# Patient Record
Sex: Female | Born: 1945 | Race: White | Hispanic: No | Marital: Single | Smoking: Former smoker
Health system: Southern US, Community
[De-identification: ages and names within clinical notes are randomized; demographics above are authoritative.]

## PROBLEM LIST (undated history)

## (undated) DIAGNOSIS — T8859XA Other complications of anesthesia, initial encounter: Secondary | ICD-10-CM

## (undated) DIAGNOSIS — T7840XA Allergy, unspecified, initial encounter: Secondary | ICD-10-CM

## (undated) DIAGNOSIS — I10 Essential (primary) hypertension: Secondary | ICD-10-CM

## (undated) DIAGNOSIS — J189 Pneumonia, unspecified organism: Secondary | ICD-10-CM

## (undated) DIAGNOSIS — R112 Nausea with vomiting, unspecified: Secondary | ICD-10-CM

## (undated) DIAGNOSIS — D649 Anemia, unspecified: Secondary | ICD-10-CM

## (undated) DIAGNOSIS — F32A Depression, unspecified: Secondary | ICD-10-CM

## (undated) DIAGNOSIS — C801 Malignant (primary) neoplasm, unspecified: Secondary | ICD-10-CM

## (undated) DIAGNOSIS — Z9889 Other specified postprocedural states: Secondary | ICD-10-CM

## (undated) DIAGNOSIS — I82401 Acute embolism and thrombosis of unspecified deep veins of right lower extremity: Secondary | ICD-10-CM

## (undated) DIAGNOSIS — R7303 Prediabetes: Secondary | ICD-10-CM

## (undated) DIAGNOSIS — C3490 Malignant neoplasm of unspecified part of unspecified bronchus or lung: Secondary | ICD-10-CM

## (undated) DIAGNOSIS — Z8719 Personal history of other diseases of the digestive system: Secondary | ICD-10-CM

## (undated) HISTORY — PX: EYE SURGERY: SHX253

## (undated) HISTORY — PX: TONSILLECTOMY: SUR1361

## (undated) HISTORY — DX: Allergy, unspecified, initial encounter: T78.40XA

## (undated) HISTORY — PX: TONSILLECTOMY AND ADENOIDECTOMY: SUR1326

## (undated) HISTORY — DX: Acute embolism and thrombosis of unspecified deep veins of right lower extremity: I82.401

## (undated) HISTORY — PX: TUBAL LIGATION: SHX77

## (undated) HISTORY — DX: Essential (primary) hypertension: I10

---

## 1999-06-07 ENCOUNTER — Other Ambulatory Visit: Admission: RE | Admit: 1999-06-07 | Discharge: 1999-06-07 | Payer: Self-pay | Admitting: Family Medicine

## 1999-06-23 ENCOUNTER — Encounter: Admission: RE | Admit: 1999-06-23 | Discharge: 1999-06-23 | Payer: Self-pay | Admitting: Family Medicine

## 1999-06-23 ENCOUNTER — Encounter: Payer: Self-pay | Admitting: Family Medicine

## 2000-06-27 ENCOUNTER — Encounter: Admission: RE | Admit: 2000-06-27 | Discharge: 2000-06-27 | Payer: Self-pay | Admitting: Family Medicine

## 2000-06-27 ENCOUNTER — Encounter: Payer: Self-pay | Admitting: Family Medicine

## 2001-06-11 ENCOUNTER — Other Ambulatory Visit: Admission: RE | Admit: 2001-06-11 | Discharge: 2001-06-11 | Payer: Self-pay | Admitting: Family Medicine

## 2001-07-01 ENCOUNTER — Encounter: Payer: Self-pay | Admitting: Family Medicine

## 2001-07-01 ENCOUNTER — Encounter: Admission: RE | Admit: 2001-07-01 | Discharge: 2001-07-01 | Payer: Self-pay | Admitting: Family Medicine

## 2002-06-16 ENCOUNTER — Other Ambulatory Visit: Admission: RE | Admit: 2002-06-16 | Discharge: 2002-06-16 | Payer: Self-pay | Admitting: Family Medicine

## 2002-07-02 ENCOUNTER — Encounter: Admission: RE | Admit: 2002-07-02 | Discharge: 2002-07-02 | Payer: Self-pay | Admitting: Family Medicine

## 2002-07-02 ENCOUNTER — Encounter: Payer: Self-pay | Admitting: Family Medicine

## 2003-08-05 ENCOUNTER — Encounter: Admission: RE | Admit: 2003-08-05 | Discharge: 2003-08-05 | Payer: Self-pay | Admitting: Family Medicine

## 2003-08-18 ENCOUNTER — Other Ambulatory Visit: Admission: RE | Admit: 2003-08-18 | Discharge: 2003-08-18 | Payer: Self-pay | Admitting: Family Medicine

## 2004-04-13 ENCOUNTER — Inpatient Hospital Stay (HOSPITAL_COMMUNITY): Admission: EM | Admit: 2004-04-13 | Discharge: 2004-04-15 | Payer: Self-pay | Admitting: Emergency Medicine

## 2004-04-15 ENCOUNTER — Encounter: Payer: Self-pay | Admitting: *Deleted

## 2004-08-18 ENCOUNTER — Other Ambulatory Visit: Admission: RE | Admit: 2004-08-18 | Discharge: 2004-08-18 | Payer: Self-pay | Admitting: Family Medicine

## 2004-08-22 ENCOUNTER — Ambulatory Visit (HOSPITAL_COMMUNITY): Admission: RE | Admit: 2004-08-22 | Discharge: 2004-08-22 | Payer: Self-pay | Admitting: Family Medicine

## 2005-08-21 ENCOUNTER — Other Ambulatory Visit: Admission: RE | Admit: 2005-08-21 | Discharge: 2005-08-21 | Payer: Self-pay | Admitting: Family Medicine

## 2006-08-29 ENCOUNTER — Encounter: Admission: RE | Admit: 2006-08-29 | Discharge: 2006-08-29 | Payer: Self-pay | Admitting: Family Medicine

## 2006-08-29 ENCOUNTER — Other Ambulatory Visit: Admission: RE | Admit: 2006-08-29 | Discharge: 2006-08-29 | Payer: Self-pay | Admitting: Family Medicine

## 2007-09-09 ENCOUNTER — Other Ambulatory Visit: Admission: RE | Admit: 2007-09-09 | Discharge: 2007-09-09 | Payer: Self-pay | Admitting: Family Medicine

## 2007-09-09 ENCOUNTER — Encounter: Admission: RE | Admit: 2007-09-09 | Discharge: 2007-09-09 | Payer: Self-pay | Admitting: Family Medicine

## 2007-12-12 ENCOUNTER — Encounter: Admission: RE | Admit: 2007-12-12 | Discharge: 2007-12-12 | Payer: Self-pay | Admitting: Orthopaedic Surgery

## 2008-05-18 ENCOUNTER — Ambulatory Visit (HOSPITAL_BASED_OUTPATIENT_CLINIC_OR_DEPARTMENT_OTHER): Admission: RE | Admit: 2008-05-18 | Discharge: 2008-05-18 | Payer: Self-pay | Admitting: Family Medicine

## 2008-09-09 ENCOUNTER — Encounter: Admission: RE | Admit: 2008-09-09 | Discharge: 2008-09-09 | Payer: Self-pay | Admitting: Family Medicine

## 2008-09-09 ENCOUNTER — Other Ambulatory Visit: Admission: RE | Admit: 2008-09-09 | Discharge: 2008-09-09 | Payer: Self-pay | Admitting: Family Medicine

## 2009-09-10 ENCOUNTER — Encounter: Admission: RE | Admit: 2009-09-10 | Discharge: 2009-09-10 | Payer: Self-pay | Admitting: Family Medicine

## 2009-09-10 ENCOUNTER — Other Ambulatory Visit: Admission: RE | Admit: 2009-09-10 | Discharge: 2009-09-10 | Payer: Self-pay | Admitting: Family Medicine

## 2010-09-29 ENCOUNTER — Other Ambulatory Visit (HOSPITAL_COMMUNITY)
Admission: RE | Admit: 2010-09-29 | Discharge: 2010-09-29 | Disposition: A | Discharge status: Home / Self Care | Payer: 59 | Source: Ambulatory Visit | Attending: Family Medicine | Admitting: Family Medicine

## 2010-09-29 ENCOUNTER — Other Ambulatory Visit: Payer: Self-pay | Admitting: Family Medicine

## 2010-09-29 DIAGNOSIS — M8580 Other specified disorders of bone density and structure, unspecified site: Secondary | ICD-10-CM

## 2010-09-29 DIAGNOSIS — Z124 Encounter for screening for malignant neoplasm of cervix: Secondary | ICD-10-CM | POA: Insufficient documentation

## 2010-09-29 DIAGNOSIS — Z1159 Encounter for screening for other viral diseases: Secondary | ICD-10-CM | POA: Insufficient documentation

## 2010-11-18 NOTE — H&P (Signed)
Kimberly Beck, Kimberly Beck            ACCOUNT NO.:  [id number]   MEDICAL RECORD NO.:  [id number]          PATIENT TYPE:  EMS   LOCATION:  ED                           FACILITY:  [facility]   PHYSICIAN:  Melissa L. Ladona Ridgel, MD  DATE OF BIRTH:  [birth year]   DATE OF ADMISSION:  04/12/2004  DATE OF DISCHARGE:                                HISTORY & PHYSICAL   PRIMARY CARE PHYSICIAN:  Caryn Bee L. Little, M.D.   CHIEF COMPLAINT:  I can't remember.   HISTORY OF PRESENT ILLNESS:  The patient is a 65 year old white female who  presented to the [facility] in the [facility] for a facial and  other treatments today.  She had undergone a consultation yesterday to  establish which treatment she was going to undergo and returned today for  those treatments.  She had established a relationship with a consultant  there who was doing her treatments.  Today, when the patient had started her  facial, she had the application of 4% lidocaine to her face, and then the  consultant paused in their therapy because the patient was experiencing a  little bit of discomfort.  They moved on to another treatment, after which  the patient stated she could not recall why she was at the [facility].  The  consultant became concerned when she could not recall the events of the past  2 days, nor could she recall meeting the consultant.  The consultant  therefore brought her to the emergency room for further evaluation.  In the  emergency room, the patient was evaluated by neurology and found to have  recollection of past events, but not for the last couple of days.   REVIEW OF SYSTEMS:  In the emergency room, the patient denied having any  recent fever, chills, nausea, vomiting, or diarrhea.  She denies any recent  weight loss that she can recall, and she was unable to identify the last  events that she could recall over the past couple of days.   PAST MEDICAL HISTORY:  Significant for osteoporosis and  hypertension.   PAST SURGICAL HISTORY:  She has had none.   SOCIAL HISTORY:  She has no children.  She is gravida 1, para 1, with a  stillborn birth.  She was married, and has not been with her partner for  greater than 10 years.  She quit tobacco in 1987.  She denies ethanol or  illicit drug use.   FAMILY HISTORY:  Her mother and father are both deceased and had  hypertension.  Other than that, she cannot recall any other significant  medical history.   ALLERGIES:  She states she has no known drug allergies.   MEDICATIONS:  She states that she takes Fosamax and Accupril, but the doses  are unknown to her.   LABORATORY DATA:  White count of 9.7, hemoglobin 13.7, hematocrit 41.5,  platelets 234.  Sodium is 138, potassium 3.7, chloride 106, CO2 of 26, BUN  11, creatinine 0.9, with a glucose of 123.  Urine drug screen is negative.   PHYSICAL EXAMINATION:  VITAL SIGNS:  Temperature is  97.3, blood pressure  151/100, pulse 98, respiratory rate 20.  She is 97% on room air.  GENERAL:  This is a tearful white female in no acute distress, other than  the fact that she cannot recall why she is in the hospital or what has been  occurring over the last couple of days.  HEENT:  She is normocephalic and atraumatic.  Pupils equal, round and  reactive to light.  Extraocular muscles are intact.  Mucous membranes are  moist.  She does have orthodontics in her lower teeth.  NECK:  Supple.  There is no JVD, no lymph nodes.  CHEST:  Clear to auscultation.  There is no rales, rhonchi, or wheezes.  CARDIOVASCULAR:  Regular rate and rhythm.  Positive S1 and S2.  No S3 or S4.  No murmurs, rubs, or gallops.  ABDOMEN:  Soft, nontender, nondistended, with positive bowel sounds.  EXTREMITIES:  No clubbing, cyanosis, or edema.  NEUROLOGIC:  Cranial nerves II-XII appear to be intact.  She knows that it  is October; however, she thinks that it is 2006, and she is unsure of what  day of the week it is.  Her  concentration skills prevent further testing.  She perseverates regarding feeding her dog and giving [place] her friend's phone  number, even though she has already given it to several people.  Her  strength is 5/5.  Deep tendon reflexes are 2+; the left appears more  hyperactive than the right.  Otherwise, her exam is nonfocal.   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  The plan is to admit her for further evaluation.  1.  Neurological.  I have appreciated Dr. Marlis Edelson consult and      recommendations.  We will therefore obtain carotid ultrasounds, MRI, MRA      of her head, EEG, and urine drug screen is completed and was negative.  2.  Cardiovascular.  Will add lisinopril 10 mg until we get a medication      list from her primary care practitioner.  3.  Will obtain records from Dr. Fredirick Maudlin office in the a.m.  4.  Pulmonary.  There are no clinical issues at this time.  I do not feel      that an x-ray is necessary at this time.  5.  Endocrine.  I will check a TSH.  6.  Otherwise, the patient will be closely observed for resolution of her      symptoms.     Meli   MLT/MEDQ  D:  04/13/2004  T:  04/13/2004  Job:  [id number]   cc:   Caryn Bee L. Little, M.D.  [address]  [place]  [place] [address]  Fax: [phone]

## 2010-11-18 NOTE — Procedures (Signed)
CLINICAL HISTORY:  A 65 year old woman with presumed episode of transient  global amnesia. EEG is performed for evaluation. The patient is described as  awake. This is a routine EEG done without photic stimulation or  hyperventilation.   DESCRIPTION OF PROCEDURE:  The dominant rhythm is tracing. It is a  moderately high amplitude alpha rhythm of 9 to 10 hertz, which predominates  posteriorly. Appears without abnormal asymmetry and ___________ with eye  opening and closing. Low amplitude fast activity is seen frontally and  centrally and appears without abnormal asymmetry. No focal slowing is noted.  No epileptiform discharges are seen. The patient remains in the awake state  throughout the recording. Photic stimulation and hyperventilation were not  performed. Single channel devoted to EKG. Reveals sinus rhythm throughout  with a rate of approximately 84 beats per minute.   CONCLUSION:  Normal study in the awake state.      ZOX:WRUE  D:  04/13/2004 18:22:29  T:  04/14/2004 01:43:52  Job #:  [id number]

## 2010-11-18 NOTE — Discharge Summary (Signed)
Kimberly Beck, Kimberly Beck            ACCOUNT NO.:  [id number]   MEDICAL RECORD NO.:  [id number]          PATIENT TYPE:  INP   LOCATION:  0484                         FACILITY:  [facility]   PHYSICIAN:  Theone Stanley, MD   DATE OF BIRTH:  [birth year]   DATE OF ADMISSION:  04/12/2004  DATE OF DISCHARGE:  04/15/2004                                 DISCHARGE SUMMARY   ADMISSION DIAGNOSES:  1.  Transglobal amnesia.  2.  Hypertension.   DISCHARGE DIAGNOSES:  1.  Transglobal amnesia.  2.  Hypertension.   HOSPITAL COURSE:  [name] is a very pleasant 65 year old Caucasian  female who apparently on her day of admission had gone to a place for some  cosmetic work. She states that they started the procedure, she felt light  burns along her crease of her face and subsequently does not remember  anything after that. She has talked to the person who was doing the  procedure and the person stated that after starting the procedure, she was  doing fine and then did not remember anything.  At that point in time, EMS  was called and she was brought to [facility] ER.  The patient was acting  confused not remembering anything. She had difficulty remembering what was  going on that day and yesterday, however, she did remember the events of the  past.  She was able to give her phone number as well as the numbers of her  neighbors and other people. She denied any headaches, focal weakness,  balance or gait difficulties, dizziness. No prior history of seizure or  stroke, TIA's or similar episodes in the past. No significant head injury.  By the next day, the patient was alert and oriented x4. She was able to  communicate without difficulty, ambulate without difficulty; however, she  could not remember anything prior to start of the procedure. Neurology was  consulted and at that time they had suggested an MRI, EEG, Dopplers which  were performed. The Dopplers did not show any gross abnormalities. A CT  scan  was done on the 11th which was negative. A MRI was subsequently done on the  14th.   IMPRESSION:  Mild prominence of perivascular spaces may suggest long  standing hypertension.  Mild changes of small vessel disease, no acute  infarct, no abnormality, intracranial enhancement.  An EEG was also  performed which showed a normal study in the awake state.  Because  everything was negative, it was felt that the final diagnosis is transglobal  amnesia possibly brought on by this procedure and the pain involved with it.  Neurology's recommendations were for followup in two months with Dr. Pearlean Brownie.  No long distance driving until seen by neurology. The patient is to see her  primary care in 2-3 weeks.   DISCHARGE MEDICATIONS:  She was not placed on any new medications. She was  to continue her home medications which were:   1.  Accupril 40 mg, 1 p.o. q.d.  2.  Aspirin 81 mg, 1 p.o. q.d.  3.  Fosamax 70 mg, 1 p.o. q. weekly.  The patient left the hospital in stable condition.      AEJ/MEDQ  D:  04/15/2004  T:  04/16/2004  Job:  [id number]   cc:   Caryn Bee L. Little, M.D.  [address]  [place]  [place] [address]  Fax: [phone]

## 2010-11-18 NOTE — Consult Note (Signed)
Kimberly Beck, Kimberly Beck            ACCOUNT NO.:  [id number]   MEDICAL RECORD NO.:  [id number]          PATIENT TYPE:  INP   LOCATION:  0484                         FACILITY:  [facility]   PHYSICIAN:  Pramod P. Pearlean Brownie, MD    DATE OF BIRTH:  15-Feb-1946   DATE OF CONSULTATION:  04/12/2004  DATE OF DISCHARGE:                                   CONSULTATION   REFERRING PHYSICIAN:  Linwood Dibbles, M.D.   REASON FOR REFERRAL:  Memory loss.   HISTORY OF PRESENT ILLNESS:  Kimberly Beck is a 65 year old lady who has  developed sudden onset of short-term memory loss today.  Patient is unable  to provide specific details of her history as she has trouble remembering.  She is accompanied today by a lady whom she met today who has been helping  her.  Apparently, the patient was in the shopping center and all of a sudden  was acting confused and not remembering things and what she was doing there.  She also had difficulty remembering even from today as well as yesterday.  She, however, has been remembering events from the past.  She was able to  give her phone number as well as numbers for her neighbors and other people.  She denies any headache, focal extremity weakness, gait/balance  difficulties, dizziness.  There is no prior history of seizures, strokes,  TIAs, similar episodes in the past, or any significant neurologic problems.  There is no history of remote seizures, encephalitis, meningitis.  No  significant head injury.   PAST MEDICAL HISTORY:  1.  Hypertension.  2.  Osteoporosis.   HOME MEDICATIONS:  1.  Fosamax.  2.  Accupril.   ALLERGIES:  None.   PAST SURGICAL HISTORY:  None.   SOCIAL HISTORY:  Patient quit smoking 20 years ago.  She does not drink  alcohol.  She lives alone in [place].   REVIEW OF SYSTEMS:  Not significant for recent illness, fever, cough, chest  pain, diarrhea.   PHYSICAL EXAMINATION:  GENERAL:  Pleasant, middle aged lady not in distress.  VITAL SIGNS:   Afebrile.  Temperature 97.3, pulse 98 per minute, regular,  respiratory rate 20 per minute, blood pressure 151/100.  Saturation 97% on  room air.  Distal pulses well felt.  HEENT:  Head is normocephalic.  ENT:  Unremarkable.  NECK:  Supple without bruit.  CARDIAC:  No murmur or gallop.  LUNGS:  Clear to auscultation.  NEUROLOGIC:  Patient is awake, alert, oriented x3.  There is no aphasia,  apraxia, or dysarthria.  She had decreased recall 0/3 and attention and  calculation.  She can, however, follow three step commands.  She appears  extremely frustrated about her condition.  Face is symmetric.  Palate  elevates normally.  Tongue is midline.  Eye movements are full range without  nystagmus.  Visual acuity appears adequate.  Visual fields are full to  confrontation testing.  Motor system examination reveals symmetric strength  __________ coordination, sensation.  She walks with a slow, steady gait  including tandem walking.  Deep tendon reflexes are symmetric.  Plantars are  downgoing.  Sensation, coordination are intact.   DATA REVIEWED:  Noncontrast CAT scan of her head done today is unremarkable.   LABORATORIES:  WBC count, chemistries both normal.   IMPRESSION:  A 65 year old lady with sudden onset of short-term memory loss  without any accompanying neurologic symptoms or signs likely secondary to  transient global amnesia.   PLAN:  Patient is being admitted to hospitalists service for observation.  Recommend MRI scan of the brain with MRA of the brain and EEG.  I expect her  memory to start improving some.  I doubt that this is an infarction or  seizure.  Kindly call for questions.  Thank you for referring this patient.      PPS/MEDQ  D:  04/12/2004  T:  04/13/2004  Job:  [id number]

## 2011-11-01 ENCOUNTER — Other Ambulatory Visit: Payer: Self-pay | Admitting: Family Medicine

## 2011-11-01 DIAGNOSIS — M899 Disorder of bone, unspecified: Secondary | ICD-10-CM

## 2011-11-01 DIAGNOSIS — M949 Disorder of cartilage, unspecified: Secondary | ICD-10-CM

## 2011-11-13 ENCOUNTER — Inpatient Hospital Stay: Admission: RE | Admit: 2011-11-13 | Payer: 59 | Source: Ambulatory Visit

## 2012-09-04 ENCOUNTER — Other Ambulatory Visit: Payer: Self-pay

## 2012-09-04 DIAGNOSIS — Z1231 Encounter for screening mammogram for malignant neoplasm of breast: Secondary | ICD-10-CM

## 2012-10-03 ENCOUNTER — Ambulatory Visit
Admission: RE | Admit: 2012-10-03 | Discharge: 2012-10-03 | Disposition: A | Discharge status: Home / Self Care | Payer: BC Managed Care – PPO | Source: Ambulatory Visit

## 2012-10-03 DIAGNOSIS — Z1231 Encounter for screening mammogram for malignant neoplasm of breast: Secondary | ICD-10-CM

## 2013-05-26 ENCOUNTER — Other Ambulatory Visit: Payer: Self-pay

## 2013-05-26 DIAGNOSIS — Z1231 Encounter for screening mammogram for malignant neoplasm of breast: Secondary | ICD-10-CM

## 2013-10-06 ENCOUNTER — Ambulatory Visit
Admission: RE | Admit: 2013-10-06 | Discharge: 2013-10-06 | Disposition: A | Discharge status: Home / Self Care | Payer: BC Managed Care – PPO | Source: Ambulatory Visit

## 2013-10-06 DIAGNOSIS — Z1231 Encounter for screening mammogram for malignant neoplasm of breast: Secondary | ICD-10-CM

## 2013-10-08 ENCOUNTER — Other Ambulatory Visit: Payer: Self-pay | Admitting: Family Medicine

## 2013-10-08 DIAGNOSIS — R928 Other abnormal and inconclusive findings on diagnostic imaging of breast: Secondary | ICD-10-CM

## 2013-10-20 ENCOUNTER — Ambulatory Visit
Admission: RE | Admit: 2013-10-20 | Discharge: 2013-10-20 | Disposition: A | Discharge status: Home / Self Care | Payer: BC Managed Care – PPO | Source: Ambulatory Visit | Attending: Family Medicine | Admitting: Family Medicine

## 2013-10-20 DIAGNOSIS — R928 Other abnormal and inconclusive findings on diagnostic imaging of breast: Secondary | ICD-10-CM

## 2014-06-23 ENCOUNTER — Other Ambulatory Visit: Payer: Self-pay

## 2014-06-23 DIAGNOSIS — Z1231 Encounter for screening mammogram for malignant neoplasm of breast: Secondary | ICD-10-CM

## 2014-11-04 ENCOUNTER — Ambulatory Visit
Admission: RE | Admit: 2014-11-04 | Discharge: 2014-11-04 | Disposition: A | Discharge status: Home / Self Care | Payer: BLUE CROSS/BLUE SHIELD | Source: Ambulatory Visit

## 2014-11-04 DIAGNOSIS — Z1231 Encounter for screening mammogram for malignant neoplasm of breast: Secondary | ICD-10-CM

## 2014-11-26 ENCOUNTER — Ambulatory Visit
Admission: RE | Admit: 2014-11-26 | Discharge: 2014-11-26 | Disposition: A | Discharge status: Home / Self Care | Payer: BLUE CROSS/BLUE SHIELD | Source: Ambulatory Visit | Attending: Family Medicine | Admitting: Family Medicine

## 2014-11-26 ENCOUNTER — Other Ambulatory Visit: Payer: Self-pay | Admitting: Family Medicine

## 2014-11-26 DIAGNOSIS — W19XXXA Unspecified fall, initial encounter: Secondary | ICD-10-CM

## 2014-11-26 DIAGNOSIS — R0781 Pleurodynia: Secondary | ICD-10-CM

## 2015-12-24 ENCOUNTER — Other Ambulatory Visit: Payer: Self-pay | Admitting: Family Medicine

## 2015-12-24 DIAGNOSIS — Z1231 Encounter for screening mammogram for malignant neoplasm of breast: Secondary | ICD-10-CM

## 2015-12-30 ENCOUNTER — Ambulatory Visit
Admission: RE | Admit: 2015-12-30 | Discharge: 2015-12-30 | Disposition: A | Discharge status: Home / Self Care | Payer: BLUE CROSS/BLUE SHIELD | Source: Ambulatory Visit | Attending: Family Medicine | Admitting: Family Medicine

## 2015-12-30 DIAGNOSIS — Z1231 Encounter for screening mammogram for malignant neoplasm of breast: Secondary | ICD-10-CM

## 2016-12-27 ENCOUNTER — Other Ambulatory Visit: Payer: Self-pay | Admitting: Family Medicine

## 2016-12-27 DIAGNOSIS — Z1231 Encounter for screening mammogram for malignant neoplasm of breast: Secondary | ICD-10-CM

## 2017-01-02 ENCOUNTER — Ambulatory Visit
Admission: RE | Admit: 2017-01-02 | Discharge: 2017-01-02 | Disposition: A | Discharge status: Home / Self Care | Payer: BLUE CROSS/BLUE SHIELD | Source: Ambulatory Visit | Attending: Family Medicine | Admitting: Family Medicine

## 2017-01-02 DIAGNOSIS — Z1231 Encounter for screening mammogram for malignant neoplasm of breast: Secondary | ICD-10-CM

## 2017-10-09 ENCOUNTER — Other Ambulatory Visit: Payer: Self-pay | Admitting: Family Medicine

## 2017-10-09 DIAGNOSIS — Z1231 Encounter for screening mammogram for malignant neoplasm of breast: Secondary | ICD-10-CM

## 2018-01-04 ENCOUNTER — Ambulatory Visit
Admission: RE | Admit: 2018-01-04 | Discharge: 2018-01-04 | Disposition: A | Discharge status: Home / Self Care | Payer: Medicare Other | Source: Ambulatory Visit | Attending: Family Medicine | Admitting: Family Medicine

## 2018-01-04 DIAGNOSIS — Z1231 Encounter for screening mammogram for malignant neoplasm of breast: Secondary | ICD-10-CM

## 2019-01-31 ENCOUNTER — Other Ambulatory Visit (HOSPITAL_BASED_OUTPATIENT_CLINIC_OR_DEPARTMENT_OTHER): Payer: Self-pay | Admitting: Family Medicine

## 2019-01-31 ENCOUNTER — Ambulatory Visit (HOSPITAL_BASED_OUTPATIENT_CLINIC_OR_DEPARTMENT_OTHER)
Admission: RE | Admit: 2019-01-31 | Discharge: 2019-01-31 | Disposition: A | Discharge status: Home / Self Care | Payer: Medicare Other | Source: Ambulatory Visit | Attending: Family Medicine | Admitting: Family Medicine

## 2019-01-31 ENCOUNTER — Other Ambulatory Visit: Payer: Self-pay

## 2019-01-31 DIAGNOSIS — R609 Edema, unspecified: Secondary | ICD-10-CM | POA: Diagnosis present

## 2019-02-07 ENCOUNTER — Other Ambulatory Visit: Payer: Self-pay | Admitting: Family Medicine

## 2019-02-07 DIAGNOSIS — Z1231 Encounter for screening mammogram for malignant neoplasm of breast: Secondary | ICD-10-CM

## 2019-02-10 ENCOUNTER — Other Ambulatory Visit: Payer: Self-pay | Admitting: Family Medicine

## 2019-02-10 DIAGNOSIS — I82432 Acute embolism and thrombosis of left popliteal vein: Secondary | ICD-10-CM

## 2019-02-10 DIAGNOSIS — I82492 Acute embolism and thrombosis of other specified deep vein of left lower extremity: Secondary | ICD-10-CM

## 2019-02-11 ENCOUNTER — Other Ambulatory Visit: Payer: Self-pay | Admitting: Family Medicine

## 2019-02-11 ENCOUNTER — Other Ambulatory Visit: Payer: Self-pay

## 2019-02-11 ENCOUNTER — Ambulatory Visit
Admission: RE | Admit: 2019-02-11 | Discharge: 2019-02-11 | Disposition: A | Discharge status: Home / Self Care | Payer: Medicare Other | Source: Ambulatory Visit | Attending: Family Medicine | Admitting: Family Medicine

## 2019-02-11 DIAGNOSIS — Z1231 Encounter for screening mammogram for malignant neoplasm of breast: Secondary | ICD-10-CM

## 2019-02-11 DIAGNOSIS — I82432 Acute embolism and thrombosis of left popliteal vein: Secondary | ICD-10-CM

## 2019-02-20 ENCOUNTER — Ambulatory Visit
Admission: RE | Admit: 2019-02-20 | Discharge: 2019-02-20 | Disposition: A | Discharge status: Home / Self Care | Payer: Medicare Other | Source: Ambulatory Visit | Attending: Family Medicine | Admitting: Family Medicine

## 2019-02-20 DIAGNOSIS — I82432 Acute embolism and thrombosis of left popliteal vein: Secondary | ICD-10-CM

## 2019-02-20 MED ORDER — IOPAMIDOL (ISOVUE-300) INJECTION 61%
100.0000 mL | Freq: Once | INTRAVENOUS | Status: AC | PRN
  Administered 2019-02-20: 100 mL via INTRAVENOUS

## 2019-04-08 NOTE — Progress Notes (Signed)
Synopsis: Referred in Oct 2020 for abnormal chest imaging by Hulan Fess, MD  Subjective:   PATIENT ID: Kimberly Beck GENDER: female DOB: 1946-06-01, MRN: [id number]  Chief Complaint  Patient presents with  . Consult    Consult for Abnormal CT. Denies having any symptoms. HX of blood clots.     HPI Here for evaluation of abnormal imaging.  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  This led to a pan CT that showed some GG nodules and tree-in bud nodularity for which she is sent to pulmonology for evaluation.  She is completely asymptomatic (no cough, fever, chills, weight loss, wheezing, DOE, chest pains, chest tightness etc, see ROS).  Does have distant heavy smoking history.  To get colonoscopy once she is off AC.  To be off AC once she has repeat LE duplex later this month.  Past Medical History:  Diagnosis Date  . Allergy   . DVT, lower extremity, recurrent, right ([place])   . HTN (hypertension)      Family History  Problem Relation Age of Onset  . Heart attack Father   . Stroke Maternal Grandfather      Past Surgical History:  Procedure Laterality Date  . TUBAL LIGATION      Social History   Socioeconomic History  . Marital status: Single    Spouse name: Not on file  . Number of children: Not on file  . Years of education: Not on file  . Highest education level: Not on file  Occupational History  . Not on file  Social Needs  . Financial resource strain: Not on file  . Food insecurity    Worry: Not on file    Inability: Not on file  . Transportation needs    Medical: Not on file    Non-medical: Not on file  Tobacco Use  . Smoking status: Former Smoker    Quit date: 1987    Years since quitting: 33.7  . Smokeless tobacco: Never Used  Substance and Sexual Activity  . Alcohol use: Not on file  . Drug use: Not on file  . Sexual activity: Not on file  Lifestyle  . Physical activity    Days per  week: Not on file    Minutes per session: Not on file  . Stress: Not on file  Relationships  . Social Herbalist on phone: Not on file    Gets together: Not on file    Attends religious service: Not on file    Active member of club or organization: Not on file    Attends meetings of clubs or organizations: Not on file    Relationship status: Not on file  . Intimate partner violence    Fear of current or ex partner: Not on file    Emotionally abused: Not on file    Physically abused: Not on file    Forced sexual activity: Not on file  Other Topics Concern  . Not on file  Social History Narrative  . Not on file     Allergies  Allergen Reactions  . Codeine Itching  . Penicillins     Dry rash     Outpatient Medications Prior to Visit  Medication Sig Dispense Refill  . ELIQUIS 5 MG TABS tablet     . escitalopram (LEXAPRO) 10 MG tablet     . lisinopril-hydrochlorothiazide (ZESTORETIC) 20-25 MG tablet Take by mouth.  No facility-administered medications prior to visit.      Positive Symptoms in bold:  Constitutional fevers, chills, weight loss, fatigue, anorexia, malaise  Eyes decreased vision, double vision, eye irritation  Ears, Nose, Mouth, Throat sore throat, trouble swallowing, sinus congestion  Cardiovascular chest pain, paroxysmal nocturnal dyspnea, lower ext edema, palpitations   Respiratory SOB, cough, DOE, hemoptysis, wheezing  Gastrointestinal nausea, vomiting, diarrhea  Genitourinary burning with urination, trouble urinating  Musculoskeletal joint aches, joint swelling, back pain  Integumentary  rashes, skin lesions  Neurological focal weakness, focal numbness, trouble speaking, headaches  Psychiatric depression, anxiety, confusion  Endocrine polyuria, polydipsia, cold intolerance, heat intolerance  Hematologic abnormal bruising, abnormal bleeding, unexplained nose bleeds  Allergic/Immunologic recurrent infections, hives, swollen lymph nodes       Objective:  GEN: elderly woman in NAD HEENT: MMM, no submandibular, cervical, occipital, supraclavicular adenopathy palpable CV: Soft, +BS PULM: Clear, no accessory muscle use GI: Soft, +BS EXT: some varicose veins, otherwise no edema NEURO: Moves all 4 ext to command PSYCH: RASS 0 SKIN: No rashes    Vitals:   04/10/19 1113  BP: 132/80  Pulse: 86  Temp: 97.9 F (36.6 C)  TempSrc: Temporal  SpO2: 98%  Weight: 147 lb 6.4 oz (66.9 kg)  Height: 5\' 6"  (1.676 m)   98% on RA BMI Readings from Last 3 Encounters:  04/10/19 23.79 kg/m   Wt Readings from Last 3 Encounters:  04/10/19 147 lb 6.4 oz (66.9 kg)     Chest Imaging: CT Chest: GG nodule in RML peri-fissural, some mild tree in bud changes more peripheral, no adenopathy, no signs of intrinsic lung disease, moderate to large hiatal hernia.  Pulmonary Functions Testing Results: none No flowsheet data found.  FeNO: none  Pathology: none  Echocardiogram: none  Heart Catheterization: none    Assessment & Plan:  # Abnormal imaging- nodule looks benign but with smoking hx should r/o slow growing adeno # Tree in bud nodularity, mild- in absence of symptoms no need to pursue further # Recurrent idiopathic DVT  Discussion: - Reassurance provided - 3 mo f/u scan is fine (due late next month), f/u w/ me after to discuss - [facility] plan sounds good, needs colonoscopy once off this   Current Outpatient Medications:  .  ELIQUIS 5 MG TABS tablet, , Disp: , Rfl:  .  escitalopram (LEXAPRO) 10 MG tablet, , Disp: , Rfl:  .  lisinopril-hydrochlorothiazide (ZESTORETIC) 20-25 MG tablet, Take by mouth., Disp: , Rfl:    Candee Furbish, MD [facility] 04/10/2019 11:37 AM

## 2019-04-10 ENCOUNTER — Other Ambulatory Visit: Payer: Self-pay

## 2019-04-10 ENCOUNTER — Ambulatory Visit: Payer: Medicare Other | Admitting: Internal Medicine

## 2019-04-10 ENCOUNTER — Encounter: Payer: Self-pay | Admitting: Internal Medicine

## 2019-04-10 DIAGNOSIS — R918 Other nonspecific abnormal finding of lung field: Secondary | ICD-10-CM | POA: Diagnosis not present

## 2019-04-10 NOTE — Patient Instructions (Signed)
Thank you for visiting Dr. Tamala Julian at [facility].  CT in 1 month then follow up with me.   Please do your part to reduce the spread of COVID-19.

## 2019-04-16 ENCOUNTER — Other Ambulatory Visit: Payer: Self-pay

## 2019-04-16 ENCOUNTER — Ambulatory Visit
Admission: RE | Admit: 2019-04-16 | Discharge: 2019-04-16 | Disposition: A | Discharge status: Home / Self Care | Payer: Medicare Other | Source: Ambulatory Visit | Attending: Internal Medicine | Admitting: Internal Medicine

## 2019-04-16 DIAGNOSIS — R918 Other nonspecific abnormal finding of lung field: Secondary | ICD-10-CM

## 2019-05-02 ENCOUNTER — Ambulatory Visit
Admission: RE | Admit: 2019-05-02 | Discharge: 2019-05-02 | Disposition: A | Discharge status: Home / Self Care | Payer: Medicare Other | Source: Ambulatory Visit | Attending: Family Medicine | Admitting: Family Medicine

## 2019-05-02 DIAGNOSIS — I82492 Acute embolism and thrombosis of other specified deep vein of left lower extremity: Secondary | ICD-10-CM

## 2019-05-08 ENCOUNTER — Ambulatory Visit: Payer: Medicare Other | Admitting: Internal Medicine

## 2019-05-08 ENCOUNTER — Other Ambulatory Visit: Payer: Self-pay

## 2019-05-08 ENCOUNTER — Encounter: Payer: Self-pay | Admitting: Internal Medicine

## 2019-05-08 DIAGNOSIS — Z23 Encounter for immunization: Secondary | ICD-10-CM

## 2019-05-08 DIAGNOSIS — R918 Other nonspecific abnormal finding of lung field: Secondary | ICD-10-CM | POA: Diagnosis not present

## 2019-05-08 NOTE — Patient Instructions (Signed)
-   Repeat scan stable - Flu shot! - See you in Aug 2021 with repeat CT, stay safe in interim

## 2019-05-08 NOTE — Progress Notes (Signed)
   S: f/u of abnormal CT chest  HPI   Initial hx as below.  F/u duplex with chronic clot, low burden.  F/u CT stable 1cm GG nodule along major fissure on R.  Distant heavy smoking hx.  Remains asymptomatic.  Taken off eliquis with plans to check a D dimer in a couple months.  Getting colonoscopy once she confirms she can stay off of eliquis.  ROS + symptoms in bold Fevers, chills, weight loss Nausea, vomiting, diarrhea Shortness of breath, wheezing, cough Chest pain, palpitations, lower ext edema    Here for evaluation of abnormal imaging.  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  This led to a pan CT that showed some GG nodules and tree-in bud nodularity for which she is sent to pulmonology for evaluation.  She is completely asymptomatic (no cough, fever, chills, weight loss, wheezing, DOE, chest pains, chest tightness etc, see ROS).  Does have distant heavy smoking history.  To get colonoscopy once she is off AC.  To be off AC once she has repeat LE duplex later this month.    Objective:  GEN: elderly woman in NAD HEENT: MMM, trachea midline CV: Soft, +BS PULM: Clear, no accessory muscle use GI: Soft, +BS EXT: mild varicose veins bilaterally, ext appear symmetric NEURO: Moves all 4 ext to command PSYCH: RASS 0 SKIN: No rashes    There were no vitals filed for this visit.   on RA BMI Readings from Last 3 Encounters:  04/10/19 23.79 kg/m   Wt Readings from Last 3 Encounters:  04/10/19 147 lb 6.4 oz (66.9 kg)     Chest Imaging: CT Chest Aug 2020: GG nodule in RML peri-fissural, some mild tree in bud changes more peripheral, no adenopathy, no signs of intrinsic lung disease, moderate to large hiatal hernia.  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

## 2019-08-24 ENCOUNTER — Ambulatory Visit: Payer: Medicare Other | Attending: Internal Medicine

## 2019-08-24 DIAGNOSIS — Z23 Encounter for immunization: Secondary | ICD-10-CM | POA: Insufficient documentation

## 2019-08-24 NOTE — Progress Notes (Signed)
   Covid-19 Vaccination Clinic  Name:  NEFERTARI REBMAN    MRN: [id number] DOB: 10/28/1945  08/24/2019  Ms. Conklin was observed post Covid-19 immunization for 15 minutes without incidence. She was provided with Vaccine Information Sheet and instruction to access the V-Safe system.   Ms. Swann was instructed to call 911 with any severe reactions post vaccine: Marland Kitchen Difficulty breathing  . Swelling of your face and throat  . A fast heartbeat  . A bad rash all over your body  . Dizziness and weakness    Immunizations Administered    Name Date Dose VIS Date Route   Pfizer COVID-19 Vaccine 08/24/2019 11:59 AM 0.3 mL 06/13/2019 Intramuscular   Manufacturer: [place]   Lot: [id number]   [place]: [id number]

## 2019-09-17 ENCOUNTER — Ambulatory Visit: Payer: Medicare Other | Attending: Internal Medicine

## 2019-09-17 DIAGNOSIS — Z23 Encounter for immunization: Secondary | ICD-10-CM

## 2019-09-17 NOTE — Progress Notes (Signed)
   Covid-19 Vaccination Clinic  Name:  Kimberly Beck    MRN: [id number] DOB: 09-Sep-1945  09/17/2019  [name] was observed post Covid-19 immunization for 15 minutes without incident. She was provided with Vaccine Information Sheet and instruction to access the V-Safe system.   [name] was instructed to call 911 with any severe reactions post vaccine: Marland Kitchen Difficulty breathing  . Swelling of face and throat  . A fast heartbeat  . A bad rash all over body  . Dizziness and weakness   Immunizations Administered    Name Date Dose VIS Date Route   Pfizer COVID-19 Vaccine 09/17/2019 10:17 AM 0.3 mL 06/13/2019 Intramuscular   Manufacturer: [place]   Lot: [id number]   [place]: [id number]

## 2020-01-20 ENCOUNTER — Other Ambulatory Visit: Payer: Self-pay

## 2020-01-20 DIAGNOSIS — I739 Peripheral vascular disease, unspecified: Secondary | ICD-10-CM

## 2020-01-29 ENCOUNTER — Ambulatory Visit: Payer: Medicare Other | Admitting: Physician Assistant

## 2020-01-29 ENCOUNTER — Ambulatory Visit (HOSPITAL_COMMUNITY)
Admission: RE | Admit: 2020-01-29 | Discharge: 2020-01-29 | Disposition: A | Discharge status: Home / Self Care | Payer: Medicare Other | Source: Ambulatory Visit | Attending: Vascular Surgery | Admitting: Vascular Surgery

## 2020-01-29 ENCOUNTER — Other Ambulatory Visit: Payer: Self-pay

## 2020-01-29 VITALS — BP 124/72 | HR 90 | Temp 97.0°F | Ht 66.0 in

## 2020-01-29 DIAGNOSIS — I739 Peripheral vascular disease, unspecified: Secondary | ICD-10-CM | POA: Insufficient documentation

## 2020-01-29 DIAGNOSIS — R6889 Other general symptoms and signs: Secondary | ICD-10-CM

## 2020-01-29 NOTE — Progress Notes (Signed)
HISTORY AND PHYSICAL     CC:  follow up. Requesting Provider:  Hulan Fess, MD  HPI: This is a 74 y.o. female who is here today for follow up for PAD.  She states that [facility] does wellness screenings for the medicare pts.  She states that the left leg test was off and she is referred today for follow up.  She denies any claudication sx or non healing wounds.  She states she had a blood clot behind the left knee for leg swelling.   She was placed on Eliquis.  She states that she had a repeat u/s in December and it was resolved and she was taken off the Eliquis.  She states that her blood pressure has been fluctuating from low to high and they are adjusting her medication.  She goes for her yearly physical next month with Dr. Hulan Fess.     Pt received Pfizer vaccine.  The pt is not on a statin for cholesterol management.    The pt is not on an aspirin.    Other AC:  Eliquis The pt is on HCTZ and ACEI for hypertension.  The pt does not have diabetes. Tobacco hx:  Former-quit 1987.  Had been smoking up to 3ppd.  She got a new car and didn't want to smoke in the car and quit smoking that day.  Pt does not have family hx of AAA.  Past Medical History:  Diagnosis Date  . Allergy   . DVT, lower extremity, recurrent, right ([place])   . HTN (hypertension)     Past Surgical History:  Procedure Laterality Date  . TUBAL LIGATION      Allergies  Allergen Reactions  . Codeine Itching  . Penicillins     Dry rash    Current Outpatient Medications  Medication Sig Dispense Refill  . ELIQUIS 5 MG TABS tablet     . escitalopram (LEXAPRO) 10 MG tablet     . lisinopril-hydrochlorothiazide (ZESTORETIC) 20-25 MG tablet Take by mouth.     No current facility-administered medications for this visit.    Family History  Problem Relation Age of Onset  . Heart attack Father   . Stroke Maternal Grandfather     Social History   Socioeconomic History  . Marital status: Single      Spouse name: Not on file  . Number of children: Not on file  . Years of education: Not on file  . Highest education level: Not on file  Occupational History  . Not on file  Tobacco Use  . Smoking status: Former Smoker    Quit date: 1987    Years since quitting: 34.5  . Smokeless tobacco: Never Used  Substance and Sexual Activity  . Alcohol use: Not on file  . Drug use: Not on file  . Sexual activity: Not on file  Other Topics Concern  . Not on file  Social History Narrative  . Not on file   Social Determinants of Health   Financial Resource Strain:   . Difficulty of Paying Living Expenses:   Food Insecurity:   . Worried About Charity fundraiser in the Last Year:   . Arboriculturist in the Last Year:   Transportation Needs:   . Film/video editor (Medical):   Marland Kitchen Lack of Transportation (Non-Medical):   Physical Activity:   . Days of Exercise per Week:   . Minutes of Exercise per Session:   Stress:   .  Feeling of Stress :   Social Connections:   . Frequency of Communication with Friends and Family:   . Frequency of Social Gatherings with Friends and Family:   . Attends Religious Services:   . Active Member of Clubs or Organizations:   . Attends Archivist Meetings:   Marland Kitchen Marital Status:   Intimate Partner Violence:   . Fear of Current or Ex-Partner:   . Emotionally Abused:   Marland Kitchen Physically Abused:   . Sexually Abused:      REVIEW OF SYSTEMS:   [X]  denotes positive finding, [ ]  denotes negative finding Cardiac  Comments:  Chest pain or chest pressure:    Shortness of breath upon exertion:    Short of breath when lying flat:    Irregular heart rhythm:        Vascular    Pain in calf, thigh, or hip brought on by ambulation:    Pain in feet at night that wakes you up from your sleep:     Blood clot in your veins: x See HPI  Leg swelling:         Pulmonary    Oxygen at home:    Productive cough:     Wheezing:         Neurologic    Sudden  weakness in arms or legs:     Sudden numbness in arms or legs:     Sudden onset of difficulty speaking or slurred speech:    Temporary loss of vision in one eye:     Problems with dizziness:         Gastrointestinal    Blood in stool:     Vomited blood:         Genitourinary    Burning when urinating:     Blood in urine:        Psychiatric    Major depression:         Hematologic    Bleeding problems:    Problems with blood clotting too easily:        Skin    Rashes or ulcers:        Constitutional    Fever or chills:      PHYSICAL EXAMINATION:  Today's Vitals   01/29/20 1357  BP: 124/72  Pulse: 90  Temp: (!) 97 F (36.1 C)  TempSrc: Temporal  SpO2: 99%  Height: 5\' 6"  (1.676 m)   Body mass index is 24.18 kg/m.   General:  WDWN in NAD; vital signs documented above Gait: Not observed HENT: WNL, normocephalic Pulmonary: normal non-labored breathing , without wheezing Cardiac: regular HR, without  Murmur; without carotid bruits Abdomen: soft, NT, aorta is not palpable Skin: without rashes Vascular Exam/Pulses:  Right Left  Radial 2+ (normal) 2+ (normal)  Ulnar Unable to palpate Unable to palpate  Popliteal Unable to palpate Unable to palpate  DP 2+ (normal) 2+ (normal)  PT Unable to palpate Unable to palpate   Extremities: without ischemic changes, without Gangrene , without cellulitis; without open wounds;  Musculoskeletal: no muscle wasting or atrophy  Neurologic: A&O X 3;  No focal weakness or paresthesias are detected Psychiatric:  The pt has Normal affect.   Non-Invasive Vascular Imaging:   ABI's/TBI's on 01/29/2020: Right:  1.02/0.74 - Great toe pressure: 127 Left:  1.01/0.98 - Great toe pressure: 119   ASSESSMENT/PLAN:: 74 y.o. female here for testing after abnormal PAD insurance screening  -pt with palpable DP pulses bilaterally and does not have  any claudication or non healing wounds.  Her ABI's/TBI's are normal.  -pt will f/u as needed if  she has any problems in the future.  -she states that her blood pressure cuff has been reading up and down.  She goes for her wellness appt next month with Dr. Rex Kras.  I advised her to take her BP machine with her to compare her machine with their BP to see if they are similar.     Leontine Locket, [facility] [phone]  Clinic MD:   Oneida Alar

## 2020-01-30 ENCOUNTER — Other Ambulatory Visit: Payer: Self-pay | Admitting: Family Medicine

## 2020-01-30 DIAGNOSIS — Z1231 Encounter for screening mammogram for malignant neoplasm of breast: Secondary | ICD-10-CM

## 2020-02-14 ENCOUNTER — Other Ambulatory Visit: Payer: Self-pay

## 2020-02-14 ENCOUNTER — Ambulatory Visit
Admission: RE | Admit: 2020-02-14 | Discharge: 2020-02-14 | Disposition: A | Discharge status: Home / Self Care | Payer: Medicare Other | Source: Ambulatory Visit | Attending: Internal Medicine | Admitting: Internal Medicine

## 2020-02-14 DIAGNOSIS — R918 Other nonspecific abnormal finding of lung field: Secondary | ICD-10-CM

## 2020-02-16 ENCOUNTER — Other Ambulatory Visit: Payer: Self-pay

## 2020-02-16 ENCOUNTER — Ambulatory Visit
Admission: RE | Admit: 2020-02-16 | Discharge: 2020-02-16 | Disposition: A | Discharge status: Home / Self Care | Payer: Medicare Other | Source: Ambulatory Visit | Attending: Family Medicine | Admitting: Family Medicine

## 2020-02-16 DIAGNOSIS — Z1231 Encounter for screening mammogram for malignant neoplasm of breast: Secondary | ICD-10-CM

## 2020-02-17 NOTE — Telephone Encounter (Signed)
Patient requesting information regarding her CT scan. 02/14/2020. Message from patient.  The chest ct scan (left lung). With all that info I don't understand any of it and am wondering if I'm OK or is something wrong that Has to be corrected?

## 2020-02-17 NOTE — Telephone Encounter (Signed)
[phone] 

## 2020-03-24 ENCOUNTER — Telehealth: Payer: Self-pay | Admitting: *Deleted

## 2020-03-24 NOTE — Telephone Encounter (Signed)
Pt returning a phone call. Pt has an appt scheduled wit BI on 04/12/2020 @ 1030 am.  Pt can be reached at [phone]. Nothing further needed.

## 2020-03-24 NOTE — Telephone Encounter (Signed)
Tried calling the pt and there was no answer- will call once more and if no response will send certified letter.

## 2020-03-24 NOTE — Telephone Encounter (Signed)
-----   Message from Garner Nash, DO sent at 03/23/2020  3:50 PM EDT ----- Regarding: FW: Imaging review  Can we reach out to this lady again and have her follow up with me in clinic to discuss lung nodule? Ok to use lung nodule slot?  If we cant get a hold of her please send certified letter.   I believe Tamala Julian has tried to get a hold of her last month with no response.   Thanks  Leory Plowman     ----- Message ----- From: Candee Furbish, MD Sent: 02/16/2020   2:19 PM EDT To: Garner Nash, DO Subject: Imaging review                                 You think you should go after now or get super D in 3 months and go then? Linna Hoff

## 2020-04-11 NOTE — Progress Notes (Signed)
Synopsis: Referred in Oct 2021 for lung nodule PCP by Hulan Fess, MD  Subjective:   PATIENT ID: Kimberly Beck GENDER: female DOB: [birth year], MRN: [id number]  Chief Complaint  Patient presents with   Follow-up    Former Dr. Tamala Julian patient, no current symptoms,     74 yo FM PMH HTN, DVT, RML GGO, with a semi-solid component concerning for malignancy, former smoker quit in 1987, 34 pack year history.  Patient is able to complete her activities of daily living.  She is not on any breathing treatments for respiratory complaints.  She has not had pulmonary function test in the past.  Here today for follow-up regarding CT scan imaging.  Initial imaging was in October 2020.  She had follow-up images in August 2021 which showed a right middle lobe groundglass nodule with a semisolid component that has more formed organization concerning for potential primary malignancy of the lung.  Patient denies fevers chills night sweats weight loss.  Or hemoptysis.    Past Medical History:  Diagnosis Date   Allergy    DVT, lower extremity, recurrent, right ([facility])    HTN (hypertension)      Family History  Problem Relation Age of Onset   Heart attack Father    Stroke Maternal Grandfather      Past Surgical History:  Procedure Laterality Date   TUBAL LIGATION      Social History   Socioeconomic History   Marital status: Single    Spouse name: Not on file   Number of children: Not on file   Years of education: Not on file   Highest education level: Not on file  Occupational History   Not on file  Tobacco Use   Smoking status: Former Smoker    Packs/day: 3.00    Years: 25.00    Pack years: 75.00    Start date: 89    Quit date: 1987    Years since quitting: 34.8   Smokeless tobacco: Never Used  Substance and Sexual Activity   Alcohol use: Not on file   Drug use: Not on file   Sexual activity: Not on file  Other Topics Concern   Not on file  Social History  Narrative   Not on file   Social Determinants of Health   Financial Resource Strain:    Difficulty of Paying Living Expenses: Not on file  Food Insecurity:    Worried About Charity fundraiser in the Last Year: Not on file   [employer] of Food in the Last Year: Not on file  Transportation Needs:    Lack of Transportation (Medical): Not on file   Lack of Transportation (Non-Medical): Not on file  Physical Activity:    Days of Exercise per Week: Not on file   Minutes of Exercise per Session: Not on file  Stress:    Feeling of Stress : Not on file  Social Connections:    Frequency of Communication with Friends and Family: Not on file   Frequency of Social Gatherings with Friends and Family: Not on file   Attends Religious Services: Not on file   Active Member of Clubs or Organizations: Not on file   Attends Archivist Meetings: Not on file   Marital Status: Not on file  Intimate Partner Violence:    Fear of Current or Ex-Partner: Not on file   Emotionally Abused: Not on file   Physically Abused: Not on file   Sexually Abused: Not on  file     Allergies  Allergen Reactions   Codeine Itching   Penicillins     Dry rash     Outpatient Medications Prior to Visit  Medication Sig Dispense Refill   Azelastine HCl 0.15 % SOLN Place into both nostrils.     escitalopram (LEXAPRO) 10 MG tablet      lisinopril-hydrochlorothiazide (ZESTORETIC) 20-25 MG tablet Take by mouth.     montelukast (SINGULAIR) 10 MG tablet Take 10 mg by mouth daily.     oxybutynin (DITROPAN-XL) 5 MG 24 hr tablet Take 5 mg by mouth daily.     ELIQUIS 5 MG TABS tablet  (Patient not taking: Reported on 01/29/2020)     No facility-administered medications prior to visit.    ROS   Objective:  Physical Exam   Vitals:   04/12/20 1028  BP: 136/74  Pulse: (!) 124  Temp: (!) 97.2 F (36.2 C)  TempSrc: Temporal  SpO2: 97%  Weight: 158 lb 9.6 oz (71.9 kg)  Height: 5\' 5"   (1.651 m)   97% on  BMI Readings from Last 3 Encounters:  04/12/20 26.39 kg/m  01/29/20 24.18 kg/m  05/08/19 24.18 kg/m   Wt Readings from Last 3 Encounters:  04/12/20 158 lb 9.6 oz (71.9 kg)  05/08/19 149 lb 12.8 oz (67.9 kg)  04/10/19 147 lb 6.4 oz (66.9 kg)     CBC No results found for: WBC, RBC, HGB, HCT, PLT, MCV, MCH, MCHC, RDW, LYMPHSABS, MONOABS, EOSABS, BASOSABS  Chest Imaging: October 2020 CT chest: Right middle lobe groundglass opacity adjacent to the fissure line of the posterior, lower lobe.  CT scan of the chest August 2021: Persistent right middle lobe groundglass opacity and, now more solid in appearance.  Also has curved edge of lobulation. This is concerning for primary bronchogenic carcinoma. The patient's images have been independently reviewed by me.    Pulmonary Functions Testing Results: No flowsheet data found.  FeNO:   Pathology:   Echocardiogram:   Heart Catheterization:      Assessment & Plan:     ICD-10-CM   1. Nodule of middle lobe of right lung  R91.1 NM PET Image Initial (PI) Skull Base To Thigh    Pulmonary Function Test    Ambulatory referral to Cardiothoracic Surgery  2. Ground glass opacity present on imaging of lung  R91.8 NM PET Image Initial (PI) Skull Base To Thigh    Pulmonary Function Test    Ambulatory referral to Cardiothoracic Surgery  3. 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  The nodule does show progression of disease.  Plan Following Extensive Data Review & Interpretation:   I reviewed prior external note(s) from July 2021, vascular surgery office note.  I reviewed the result(s) of October 2020 ultrasound lower extremity  I have ordered nuclear medicine PET scan, full PFTs, referral to  cardiothoracic surgery, Dr. Kipp Brood  Independent interpretation of tests  Reviewed as above  Discussion of management with Dr. Kipp Brood from cardiothoracic surgery.  Discussed the possibility of combined case to include electromagnetic additional bronchoscopy plus robotic assisted surgery for lobectomy.  Today we discussed the risk benefits and alternatives of proceeding with bronchoscopy.  Patient is agreeable to proceed.  However she would like to potentially have all of this done  at one time meaning having a bronchoscopy to prove that she has malignancy if it was a malignancy and she was a candidate for surgical resection then to go ahead and have everything done all at once.  I explained that we do occasionally have these combination cases organized.  And that I would reach out to Dr. Kipp Brood to discuss the possibility.     Current Outpatient Medications:    Azelastine HCl 0.15 % SOLN, Place into both nostrils., Disp: , Rfl:    escitalopram (LEXAPRO) 10 MG tablet, , Disp: , Rfl:    lisinopril-hydrochlorothiazide (ZESTORETIC) 20-25 MG tablet, Take by mouth., Disp: , Rfl:    montelukast (SINGULAIR) 10 MG tablet, Take 10 mg by mouth daily., Disp: , Rfl:    oxybutynin (DITROPAN-XL) 5 MG 24 hr tablet, Take 5 mg by mouth daily., Disp: , Rfl:   I spent 62 minutes dedicated to the care of this patient on the date of this encounter to include pre-visit review of records, face-to-face time with the patient discussing conditions above, post visit ordering of testing, clinical documentation with the electronic health record, making appropriate referrals as documented, and communicating necessary findings to members of the patients care team.   Garner Nash, DO [facility] 04/12/2020 11:01 AM

## 2020-04-11 NOTE — Patient Instructions (Addendum)
Thank you for visiting Dr. Valeta Harms at [facility]. Today we recommend the following:  Orders Placed This Encounter  Procedures   NM PET Image Initial (PI) Skull Base To Thigh   Ambulatory referral to Cardiothoracic Surgery   Pulmonary Function Test   We will set you up for appt with Dr. Kipp Brood from Cardiothoracic Surgery.   Return in about 6 weeks (around 05/24/2020) for  Dr. Valeta Harms.    Please do your part to reduce the spread of COVID-19.

## 2020-04-12 ENCOUNTER — Ambulatory Visit: Payer: Medicare Other | Admitting: Pulmonary Disease

## 2020-04-12 ENCOUNTER — Encounter: Payer: Self-pay | Admitting: Pulmonary Disease

## 2020-04-12 ENCOUNTER — Other Ambulatory Visit: Payer: Self-pay

## 2020-04-12 VITALS — BP 136/74 | HR 124 | Temp 97.2°F | Ht 65.0 in | Wt 158.6 lb

## 2020-04-12 DIAGNOSIS — Z87891 Personal history of nicotine dependence: Secondary | ICD-10-CM | POA: Diagnosis not present

## 2020-04-12 DIAGNOSIS — R918 Other nonspecific abnormal finding of lung field: Secondary | ICD-10-CM | POA: Diagnosis not present

## 2020-04-12 DIAGNOSIS — R911 Solitary pulmonary nodule: Secondary | ICD-10-CM

## 2020-04-13 ENCOUNTER — Telehealth: Payer: Self-pay | Admitting: Pulmonary Disease

## 2020-04-13 MED ORDER — ALPRAZOLAM 0.5 MG PO TABS: 1.0000 mg | ORAL_TABLET | Freq: Once | ORAL | 0 refills | Status: AC

## 2020-04-13 NOTE — Telephone Encounter (Signed)
Called and spoke with pt. Pt stated she needs to have something prescribed for her to take prior to having the PET performed 10/19. Pt stated that she is severely claustrophobic. Pt said that she has taken valium before prior to having an MRI and that didn't even help calm her nerves prior to the scan.  Dr. Valeta Harms, please advise if you are okay prescribing something for pt to take prior to the PET 10/19.

## 2020-04-13 NOTE — Telephone Encounter (Signed)
Orders placed for Xanax 1mg  X1  Prior to [place], DO [facility] 04/13/2020 3:51 PM

## 2020-04-13 NOTE — Telephone Encounter (Signed)
Called and spoke with pt letting her know that Dr. Valeta Harms sent Rx of Xanax to pharmacy for her to take prior to the PET and she verbalized understanding. Nothing further needed.

## 2020-04-16 ENCOUNTER — Ambulatory Visit (INDEPENDENT_AMBULATORY_CARE_PROVIDER_SITE_OTHER): Payer: Medicare Other | Admitting: Pulmonary Disease

## 2020-04-16 ENCOUNTER — Other Ambulatory Visit: Payer: Self-pay

## 2020-04-16 ENCOUNTER — Telehealth: Payer: Self-pay | Admitting: Pulmonary Disease

## 2020-04-16 DIAGNOSIS — R911 Solitary pulmonary nodule: Secondary | ICD-10-CM | POA: Diagnosis not present

## 2020-04-16 DIAGNOSIS — R918 Other nonspecific abnormal finding of lung field: Secondary | ICD-10-CM

## 2020-04-16 LAB — PULMONARY FUNCTION TEST
DL/VA % pred: 90 %
DL/VA: 3.68 ml/min/mmHg/L
DLCO cor % pred: 72 %
DLCO cor: 14.54 ml/min/mmHg
DLCO unc % pred: 72 %
DLCO unc: 14.54 ml/min/mmHg
FEF 25-75 Post: 1.58 L/sec
FEF 25-75 Pre: 1.4 L/sec
FEF2575-%Change-Post: 12 %
FEF2575-%Pred-Post: 86 %
FEF2575-%Pred-Pre: 76 %
FEV1-%Change-Post: 2 %
FEV1-%Pred-Post: 85 %
FEV1-%Pred-Pre: 83 %
FEV1-Post: 1.96 L
FEV1-Pre: 1.91 L
FEV1FVC-%Change-Post: 6 %
FEV1FVC-%Pred-Pre: 98 %
FEV6-%Change-Post: -3 %
FEV6-%Pred-Post: 85 %
FEV6-%Pred-Pre: 88 %
FEV6-Post: 2.48 L
FEV6-Pre: 2.57 L
FEV6FVC-%Change-Post: 0 %
FEV6FVC-%Pred-Post: 104 %
FEV6FVC-%Pred-Pre: 104 %
FVC-%Change-Post: -3 %
FVC-%Pred-Post: 81 %
FVC-%Pred-Pre: 84 %
FVC-Post: 2.48 L
FVC-Pre: 2.58 L
Post FEV1/FVC ratio: 79 %
Post FEV6/FVC ratio: 100 %
Pre FEV1/FVC ratio: 74 %
Pre FEV6/FVC Ratio: 100 %

## 2020-04-16 MED ORDER — ALPRAZOLAM 1 MG PO TABS: 1.0000 mg | ORAL_TABLET | Freq: Once | ORAL | 0 refills | Status: AC

## 2020-04-16 NOTE — Telephone Encounter (Signed)
Called and spoke with patient to let her know RX didn't go through to pharmacy. RX printed off, Dr. Valeta Harms signed and faxed to pharmacy. Confirmation received. Patient expressed understanding. Nothing further needed at this time.

## 2020-04-16 NOTE — Progress Notes (Signed)
PFT done today. 

## 2020-04-20 ENCOUNTER — Encounter (HOSPITAL_COMMUNITY)
Admission: RE | Admit: 2020-04-20 | Discharge: 2020-04-20 | Disposition: A | Discharge status: Home / Self Care | Payer: Medicare Other | Source: Ambulatory Visit | Attending: Pulmonary Disease | Admitting: Pulmonary Disease

## 2020-04-20 ENCOUNTER — Other Ambulatory Visit: Payer: Self-pay

## 2020-04-20 DIAGNOSIS — R918 Other nonspecific abnormal finding of lung field: Secondary | ICD-10-CM | POA: Diagnosis present

## 2020-04-20 DIAGNOSIS — R911 Solitary pulmonary nodule: Secondary | ICD-10-CM | POA: Diagnosis present

## 2020-04-20 LAB — GLUCOSE, CAPILLARY: Glucose-Capillary: 97 mg/dL (ref 70–99)

## 2020-04-20 MED ORDER — FLUDEOXYGLUCOSE F - 18 (FDG) INJECTION
7.8000 | Freq: Once | INTRAVENOUS | Status: AC | PRN
  Administered 2020-04-20: 7.8 via INTRAVENOUS

## 2020-04-23 ENCOUNTER — Encounter: Payer: Self-pay | Admitting: Thoracic Surgery (Cardiothoracic Vascular Surgery)

## 2020-04-23 ENCOUNTER — Institutional Professional Consult (permissible substitution): Payer: Medicare Other | Admitting: Thoracic Surgery (Cardiothoracic Vascular Surgery)

## 2020-04-23 ENCOUNTER — Other Ambulatory Visit: Payer: Self-pay

## 2020-04-23 VITALS — BP 130/83 | HR 98 | Temp 97.8°F | Resp 20 | Ht 65.0 in | Wt 153.0 lb

## 2020-04-23 DIAGNOSIS — R911 Solitary pulmonary nodule: Secondary | ICD-10-CM

## 2020-04-23 NOTE — Progress Notes (Signed)
[address]       [place] [address]             [phone]                    Addilyn P Hutzler  Medical Record #[id number] Date of Birth: May 21, 1946  Referring: Garner Nash, DO Primary Care: Hulan Fess, MD Primary Cardiologist: No primary care provider on file.  Chief Complaint:    Chief Complaint  Patient presents with   Consult    Surgical consult, Chest CT 02/14/20, PET Scan 04/20/20, PFT's 04/16/20    Lung Lesion    History of Present Illness:    Chlora KENNIE KARAPETIAN 74 y.o. female referred by Dr. Valeta Harms for surgical evaluation of a right middle lobe pulmonary nodule.  She does have a history of smoking but quit in the 1980s, and has been followed by Dr. Valeta Harms for this nodule.  Over the last year the size has remained stable but it has changed in regards to the density.  She would like this removed for further evaluation.  Cross-sectional imaging she was also noted to have a large hiatal hernia as well as hypermetabolism at the base of her tongue when I PET scan.  She is not symptomatic from a gastrointestinal standpoint.  She denies any dysphagia or odontophagia.  She occasionally has some reflux but this is treated with Tums.  She has not been on any antacid medications.  She does admit to some early satiety, as well as some exertional dyspnea but is able to walk up several flights of stairs without much issue.       Zubrod Score: At the time of surgery this patients most appropriate activity status/level should be described as: [x]     0    Normal activity, no symptoms []     1    Restricted in physical strenuous activity but ambulatory, able to do out light work []     2    Ambulatory and capable of self care, unable to do work activities, up and about               >50 % of waking hours                              []     3    Only limited self care, in bed greater than 50% of waking hours []     4    Completely disabled, no self  care, confined to bed or chair []     5    Moribund   Past Medical History:  Diagnosis Date   Allergy    DVT, lower extremity, recurrent, right (HCC)    HTN (hypertension)     Past Surgical History:  Procedure Laterality Date   TUBAL LIGATION      Family History  Problem Relation Age of Onset   Heart attack Father    Stroke Maternal Grandfather      Social History   Tobacco Use  Smoking Status Former Smoker   Packs/day: 3.00   Years: 25.00   Pack years: 75.00   Start date: 1962   Quit date: 1987   Years since quitting: 34.8  Smokeless Tobacco Never Used    Social History   Substance and Sexual Activity  Alcohol Use None     Allergies  Allergen Reactions   Codeine Itching  Penicillins     Dry rash    Current Outpatient Medications  Medication Sig Dispense Refill   Azelastine HCl 0.15 % SOLN Place into both nostrils.     escitalopram (LEXAPRO) 10 MG tablet      lisinopril-hydrochlorothiazide (ZESTORETIC) 20-25 MG tablet Take by mouth.     montelukast (SINGULAIR) 10 MG tablet Take 10 mg by mouth daily.     oxybutynin (DITROPAN-XL) 5 MG 24 hr tablet Take 5 mg by mouth daily.     No current facility-administered medications for this visit.    Review of Systems  Constitutional: Negative.   Respiratory: Positive for shortness of breath.   Cardiovascular: Negative.   Gastrointestinal: Negative for abdominal pain, heartburn, nausea and vomiting.  Psychiatric/Behavioral: Positive for depression.     PHYSICAL EXAMINATION: BP 130/83    Pulse 98    Temp 97.8 F (36.6 C) (Skin)    Resp 20    Ht 5\' 5"  (1.651 m)    Wt 153 lb (69.4 kg)    SpO2 96% Comment: RA with  mask on   BMI 25.46 kg/m  Physical Exam Constitutional:      General: She is not in acute distress.    Appearance: Normal appearance. She is normal weight. She is not ill-appearing.  HENT:     Head: Normocephalic and atraumatic.  Eyes:     Extraocular Movements: Extraocular  movements intact.     Conjunctiva/sclera: Conjunctivae normal.  Cardiovascular:     Rate and Rhythm: Normal rate.  Pulmonary:     Effort: Pulmonary effort is normal. No respiratory distress.  Abdominal:     General: Abdomen is flat. There is no distension.    Musculoskeletal:        General: Normal range of motion.     Cervical back: Normal range of motion.  Neurological:     General: No focal deficit present.     Mental Status: She is alert and oriented to person, place, and time.     Diagnostic Studies & Laboratory data:     Recent Radiology Findings:   NM PET Image Initial (PI) Skull Base To Thigh  Result Date: 04/21/2020 CLINICAL DATA:  Initial treatment strategy for right middle lobe pulmonary nodule. EXAM: NUCLEAR MEDICINE PET SKULL BASE TO THIGH TECHNIQUE: 7.8 mCi F-18 FDG was injected intravenously. Full-ring PET imaging was performed from the skull base to thigh after the radiotracer. CT data was obtained and used for attenuation correction and anatomic localization. Fasting blood glucose: 97 mg/dl COMPARISON:  Chest CT 02/14/2020 and 04/16/2019 FINDINGS: Mediastinal blood pool activity: SUV max 2.77 Liver activity: SUV max NA NECK: There is an area hypermetabolism noted along the left tongue base region. SUV max is 6.76. No obvious lesion is identified on the CT scan but recommend ENT consultation and direct visualization to exclude the possibility of neoplasm. No enlarged or hypermetabolic lymph nodes are identified. Incidental CT findings: Vascular calcifications noted. CHEST: No hypermetabolism is demonstrated in the right middle lobe ground-glass lesion. SUV max is 1.02. No enlarged or hypermetabolic mediastinal or hilar lymph nodes. No breast masses, supraclavicular or axillary adenopathy. Incidental CT findings: Stable advanced atherosclerotic calcifications involving the aorta and coronary arteries. Stable large hiatal hernia with a good portion of the stomach up in the  chest. ABDOMEN/PELVIS: No abnormal hypermetabolic activity within the liver, pancreas, adrenal glands, or spleen. No hypermetabolic lymph nodes in the abdomen or pelvis. Incidental CT findings: Benign hepatic cysts are noted. Stable aortic calcifications. SKELETON: No  focal hypermetabolic activity to suggest skeletal metastasis. Incidental CT findings: none IMPRESSION: 1. Right middle lobe ground-glass lesion does not demonstrate any hypermetabolism. Recommend continued CT surveillance. Follow-up noncontrast chest CT in 6 months suggested. 2. Area of moderate hypermetabolism in the left tongue base region without obvious mass. Recommend ENT consultation and direct visualization to exclude neoplasm. No neck adenopathy. Electronically Signed   By: Marijo Sanes M.D.   On: 04/21/2020 08:53       I have independently reviewed the above radiology studies  and reviewed the findings with the patient.   Recent Lab Findings: No results found for: WBC, HGB, HCT, PLT, GLUCOSE, CHOL, TRIG, HDL, LDLDIRECT, LDLCALC, ALT, AST, NA, K, CL, CREATININE, BUN, CO2, TSH, INR, GLUF, HGBA1C   PFTs: - FVC: 84% - FEV1: 83% -DLCO: 72%  Problem List: Right middle lobe pulmonary nodule Large hiatal hernia Hypermetabolism at the base of the PET scan.  Assessment / Plan:   74 year old female with a 1.8 cm right middle lobe pulmonary nodule that is developed a new density on serial scans.  It does not have significant uptake on the PET scan but given its size further evaluation is warranted.  We discussed several options which included a navigational bronchoscopy, and surgical biopsy.  Given that the patient wants this removed regardless I think that we should proceed with the surgical biopsy.  It is located on the fissure and thus potentially will be easily accessible for a wedge resection if I am unable to wedge this out I think she will require lobectomy for the purposes of biopsy, and she is agreeable to proceed with  that.  She is tentatively scheduled for April 29, 2020.  In regards to her hiatal hernia she does not have many symptoms from a dysphagia or reflux standpoint but she does admit to some early satiety and dyspnea.  On review of the images she was impressed by the findings and stated that she would like this addressed.  Once she recovers from her lung operation then we will get her on the schedule for a paraesophageal hernia repair.  In regards to the hypermetabolism at the base of her tongue, I have made a referral for ENT to evaluate this.     I  spent 40 minutes with  the patient face to face and greater then 50% of the time was spent in counseling and coordination of care.    Lajuana Matte 04/23/2020 2:37 PM

## 2020-04-26 NOTE — Progress Notes (Signed)
Your procedure is scheduled on Thursday, October 28th.  Report to [facility] Main Entrance "A" at 5:30 A.M., and check in at the Admitting office.  Call this number if you have problems the morning of surgery:  [phone]  Call [phone] if you have any questions prior to your surgery date Monday-Friday 8am-4pm   Remember:  Do not eat or drink after midnight the night before your surgery    Take these medicines the morning of surgery with A SIP OF WATER  Azelastine/nasal spray escitalopram (LEXAPRO)  montelukast (SINGULAIR)    Follow your surgeon's instructions on when to stop Aspirin.  If no instructions were given by your surgeon then you will need to call the office to get those instructions.    As of today, STOP taking Aleve, Naproxen, Ibuprofen, Motrin, Advil, Goody's, BC's, all herbal medications, fish oil, and all vitamins.             Do not wear jewelry, make up, or nail polish            Do not wear lotions, powders, perfumes, or deodorant.            Do not shave 48 hours prior to surgery.              Do not bring valuables to the hospital.            [facility] is not responsible for any belongings or valuables.  Do NOT Smoke (Tobacco/Vaping) or drink Alcohol 24 hours prior to your procedure If you use a CPAP at night, you may bring all equipment for your overnight stay.   Contacts, glasses, dentures or bridgework may not be worn into surgery.      For patients admitted to the hospital, discharge time will be determined by your treatment team.   Patients discharged the day of surgery will not be allowed to drive home, and someone needs to stay with them for 24 hours.  Special instructions:   [facility]- Preparing For Surgery  Before surgery, you can play an important role. Because skin is not sterile, your skin needs to be as free of germs as possible. You can reduce the number of germs on your skin by washing with CHG (chlorahexidine gluconate) Soap before  surgery.  CHG is an antiseptic cleaner which kills germs and bonds with the skin to continue killing germs even after washing.    Oral Hygiene is also important to reduce your risk of infection.  Remember - BRUSH YOUR TEETH THE MORNING OF SURGERY WITH YOUR REGULAR TOOTHPASTE  Please do not use if you have an allergy to CHG or antibacterial soaps. If your skin becomes reddened/irritated stop using the CHG.  Do not shave (including legs and underarms) for at least 48 hours prior to first CHG shower. It is OK to shave your face.  Please follow these instructions carefully.   1. Shower the NIGHT BEFORE SURGERY and the MORNING OF SURGERY with CHG Soap.   2. If you chose to wash your hair, wash your hair first as usual with your normal shampoo.  3. After you shampoo, rinse your hair and body thoroughly to remove the shampoo.  4. Use CHG as you would any other liquid soap. You can apply CHG directly to the skin and wash gently with a scrungie or a clean washcloth.   5. Apply the CHG Soap to your body ONLY FROM THE NECK DOWN.  Do not use on open wounds or  open sores. Avoid contact with your eyes, ears, mouth and genitals (private parts). Wash Face and genitals (private parts)  with your normal soap.   6. Wash thoroughly, paying special attention to the area where your surgery will be performed.  7. Thoroughly rinse your body with warm water from the neck down.  8. DO NOT shower/wash with your normal soap after using and rinsing off the CHG Soap.  9. Pat yourself dry with a CLEAN TOWEL.  10. Wear CLEAN PAJAMAS to bed the night before surgery  11. Place CLEAN SHEETS on your bed the night of your first shower and DO NOT SLEEP WITH PETS.  Day of Surgery: Wear Clean/Comfortable clothing the morning of surgery Do not apply any deodorants/lotions.   Remember to brush your teeth WITH YOUR REGULAR TOOTHPASTE.   Please read over the following fact sheets that you were given.

## 2020-04-27 ENCOUNTER — Other Ambulatory Visit: Payer: Self-pay

## 2020-04-27 ENCOUNTER — Encounter (HOSPITAL_COMMUNITY)
Admission: RE | Admit: 2020-04-27 | Discharge: 2020-04-27 | Disposition: A | Discharge status: Home / Self Care | Payer: Medicare Other | Source: Ambulatory Visit | Attending: Thoracic Surgery (Cardiothoracic Vascular Surgery) | Admitting: Thoracic Surgery (Cardiothoracic Vascular Surgery)

## 2020-04-27 ENCOUNTER — Encounter (HOSPITAL_COMMUNITY): Payer: Self-pay

## 2020-04-27 ENCOUNTER — Ambulatory Visit (HOSPITAL_COMMUNITY)
Admission: RE | Admit: 2020-04-27 | Discharge: 2020-04-27 | Disposition: A | Discharge status: Home / Self Care | Payer: Medicare Other | Source: Ambulatory Visit | Attending: Thoracic Surgery (Cardiothoracic Vascular Surgery) | Admitting: Thoracic Surgery (Cardiothoracic Vascular Surgery)

## 2020-04-27 ENCOUNTER — Other Ambulatory Visit (HOSPITAL_COMMUNITY)
Admission: RE | Admit: 2020-04-27 | Discharge: 2020-04-27 | Disposition: A | Discharge status: Home / Self Care | Payer: Medicare Other | Source: Ambulatory Visit | Attending: Thoracic Surgery (Cardiothoracic Vascular Surgery) | Admitting: Thoracic Surgery (Cardiothoracic Vascular Surgery)

## 2020-04-27 DIAGNOSIS — Z01818 Encounter for other preprocedural examination: Secondary | ICD-10-CM | POA: Insufficient documentation

## 2020-04-27 DIAGNOSIS — R911 Solitary pulmonary nodule: Secondary | ICD-10-CM

## 2020-04-27 DIAGNOSIS — I10 Essential (primary) hypertension: Secondary | ICD-10-CM | POA: Insufficient documentation

## 2020-04-27 DIAGNOSIS — Z20822 Contact with and (suspected) exposure to covid-19: Secondary | ICD-10-CM | POA: Insufficient documentation

## 2020-04-27 HISTORY — DX: Anemia, unspecified: D64.9

## 2020-04-27 HISTORY — DX: Depression, unspecified: F32.A

## 2020-04-27 HISTORY — DX: Pneumonia, unspecified organism: J18.9

## 2020-04-27 LAB — COMPREHENSIVE METABOLIC PANEL
ALT: 11 U/L (ref 0–44)
AST: 22 U/L (ref 15–41)
Albumin: 3.8 g/dL (ref 3.5–5.0)
Alkaline Phosphatase: 73 U/L (ref 38–126)
Anion gap: 13 (ref 5–15)
BUN: 19 mg/dL (ref 8–23)
CO2: 23 mmol/L (ref 22–32)
Calcium: 9.1 mg/dL (ref 8.9–10.3)
Chloride: 105 mmol/L (ref 98–111)
Creatinine, Ser: 1.2 mg/dL — ABNORMAL HIGH (ref 0.44–1.00)
GFR, Estimated: 48 mL/min — ABNORMAL LOW (ref >60)
Glucose, Bld: 128 mg/dL — ABNORMAL HIGH (ref 70–99)
Potassium: 3.1 mmol/L — ABNORMAL LOW (ref 3.5–5.1)
Sodium: 141 mmol/L (ref 135–145)
Total Bilirubin: 0.5 mg/dL (ref 0.3–1.2)
Total Protein: 6.9 g/dL (ref 6.5–8.1)

## 2020-04-27 LAB — URINALYSIS, ROUTINE W REFLEX MICROSCOPIC
Bacteria, UA: NONE SEEN
Bilirubin Urine: NEGATIVE
Glucose, UA: NEGATIVE mg/dL
Ketones, ur: NEGATIVE mg/dL
Leukocytes,Ua: NEGATIVE
Nitrite: NEGATIVE
Protein, ur: NEGATIVE mg/dL
Specific Gravity, Urine: 1.021 (ref 1.005–1.030)
pH: 5 (ref 5.0–8.0)

## 2020-04-27 LAB — CBC
HCT: 41 % (ref 36.0–46.0)
Hemoglobin: 12.6 g/dL (ref 12.0–15.0)
MCH: 26.3 pg (ref 26.0–34.0)
MCHC: 30.7 g/dL (ref 30.0–36.0)
MCV: 85.4 fL (ref 80.0–100.0)
Platelets: 292 10*3/uL (ref 150–400)
RBC: 4.8 MIL/uL (ref 3.87–5.11)
RDW: 21.6 % — ABNORMAL HIGH (ref 11.5–15.5)
WBC: 7.7 10*3/uL (ref 4.0–10.5)
nRBC: 0 % (ref 0.0–0.2)

## 2020-04-27 LAB — TYPE AND SCREEN
ABO/RH(D): O POS
Antibody Screen: NEGATIVE

## 2020-04-27 LAB — SURGICAL PCR SCREEN
MRSA, PCR: NEGATIVE
Staphylococcus aureus: NEGATIVE

## 2020-04-27 LAB — BLOOD GAS, ARTERIAL
Acid-Base Excess: 2.2 mmol/L — ABNORMAL HIGH (ref 0.0–2.0)
Bicarbonate: 24.9 mmol/L (ref 20.0–28.0)
Drawn by: 58793
FIO2: 21
O2 Saturation: 99.1 %
Patient temperature: 37
pCO2 arterial: 30.6 mmHg — ABNORMAL LOW (ref 32.0–48.0)
pH, Arterial: 7.522 — ABNORMAL HIGH (ref 7.350–7.450)
pO2, Arterial: 127 mmHg — ABNORMAL HIGH (ref 83.0–108.0)

## 2020-04-27 LAB — PROTIME-INR
INR: 0.9 (ref 0.8–1.2)
Prothrombin Time: 12.2 seconds (ref 11.4–15.2)

## 2020-04-27 LAB — APTT: aPTT: 26 seconds (ref 24–36)

## 2020-04-27 LAB — SARS CORONAVIRUS 2 (TAT 6-24 HRS): SARS Coronavirus 2: NEGATIVE

## 2020-04-27 NOTE — Progress Notes (Addendum)
PCP - Hulan Fess  Chest x-ray - 04-27-20 EKG - 04-27-20  Aspirin Instructions: Last dose taken 04-24-20   COVID TEST- 04-27-20   Anesthesia review: n/a  Patient denies shortness of breath, fever, cough and chest pain at PAT appointment   All instructions explained to the patient, with a verbal understanding of the material. Patient agrees to go over the instructions while at home for a better understanding. Patient also instructed to self quarantine after being tested for COVID-19. The opportunity to ask questions was provided.   Noted: Pt stated that she always has a small amount of blood in her urine, and has followed up with her doctor.  Pt stated concern after giving urine sample.

## 2020-04-28 NOTE — Anesthesia Preprocedure Evaluation (Addendum)
Anesthesia Evaluation  Patient identified by MRN, date of birth, ID band Patient awake    Reviewed: Allergy & Precautions, NPO status , Patient's Chart, lab work & pertinent test results  Airway Mallampati: II  TM Distance: >3 FB Neck ROM: Full    Dental  (+) Dental Advisory Given, Teeth Intact   Pulmonary pneumonia, resolved, former smoker,    Pulmonary exam normal breath sounds clear to auscultation       Cardiovascular hypertension, Pt. on medications Normal cardiovascular exam Rhythm:Regular Rate:Normal     Neuro/Psych PSYCHIATRIC DISORDERS Depression negative neurological ROS     GI/Hepatic negative GI ROS, Neg liver ROS,   Endo/Other  negative endocrine ROS  Renal/GU negative Renal ROS     Musculoskeletal negative musculoskeletal ROS (+)   Abdominal   Peds  Hematology negative hematology ROS (+) anemia ,   Anesthesia Other Findings   Reproductive/Obstetrics                            Anesthesia Physical Anesthesia Plan  ASA: II  Anesthesia Plan: General   Post-op Pain Management:    Induction: Intravenous  PONV Risk Score and Plan: 4 or greater and Ondansetron, Dexamethasone and Treatment may vary due to age or medical condition  Airway Management Planned: Double Lumen EBT  Additional Equipment: Arterial line  Intra-op Plan:   Post-operative Plan: Extubation in OR  Informed Consent: I have reviewed the patients History and Physical, chart, labs and discussed the procedure including the risks, benefits and alternatives for the proposed anesthesia with the patient or authorized representative who has indicated his/her understanding and acceptance.     Dental advisory given  Plan Discussed with: CRNA  Anesthesia Plan Comments: (2xPIV)       Anesthesia Quick Evaluation

## 2020-04-29 ENCOUNTER — Inpatient Hospital Stay (HOSPITAL_COMMUNITY): Payer: Medicare Other | Admitting: Anesthesiology

## 2020-04-29 ENCOUNTER — Other Ambulatory Visit: Payer: Self-pay

## 2020-04-29 ENCOUNTER — Encounter (HOSPITAL_COMMUNITY)
Admission: RE | Disposition: A | Discharge status: Home / Self Care | Payer: Self-pay | Source: Home / Self Care | Attending: Thoracic Surgery (Cardiothoracic Vascular Surgery)

## 2020-04-29 ENCOUNTER — Encounter (HOSPITAL_COMMUNITY): Payer: Self-pay | Admitting: Thoracic Surgery (Cardiothoracic Vascular Surgery)

## 2020-04-29 ENCOUNTER — Inpatient Hospital Stay (HOSPITAL_COMMUNITY): Payer: Medicare Other

## 2020-04-29 ENCOUNTER — Inpatient Hospital Stay (HOSPITAL_COMMUNITY)
Admission: RE | Admit: 2020-04-29 | Discharge: 2020-05-01 | DRG: 164 | Disposition: A | Discharge status: Home / Self Care | Payer: Medicare Other | Attending: Thoracic Surgery (Cardiothoracic Vascular Surgery) | Admitting: Thoracic Surgery (Cardiothoracic Vascular Surgery)

## 2020-04-29 DIAGNOSIS — C342 Malignant neoplasm of middle lobe, bronchus or lung: Principal | ICD-10-CM | POA: Diagnosis present

## 2020-04-29 DIAGNOSIS — Z20822 Contact with and (suspected) exposure to covid-19: Secondary | ICD-10-CM | POA: Diagnosis present

## 2020-04-29 DIAGNOSIS — F32A Depression, unspecified: Secondary | ICD-10-CM | POA: Diagnosis present

## 2020-04-29 DIAGNOSIS — D62 Acute posthemorrhagic anemia: Secondary | ICD-10-CM | POA: Diagnosis not present

## 2020-04-29 DIAGNOSIS — Z885 Allergy status to narcotic agent status: Secondary | ICD-10-CM

## 2020-04-29 DIAGNOSIS — E876 Hypokalemia: Secondary | ICD-10-CM | POA: Diagnosis present

## 2020-04-29 DIAGNOSIS — Z880 Allergy status to penicillin: Secondary | ICD-10-CM | POA: Diagnosis not present

## 2020-04-29 DIAGNOSIS — K449 Diaphragmatic hernia without obstruction or gangrene: Secondary | ICD-10-CM | POA: Diagnosis present

## 2020-04-29 DIAGNOSIS — I10 Essential (primary) hypertension: Secondary | ICD-10-CM | POA: Diagnosis present

## 2020-04-29 DIAGNOSIS — Z86718 Personal history of other venous thrombosis and embolism: Secondary | ICD-10-CM | POA: Diagnosis not present

## 2020-04-29 DIAGNOSIS — Z8249 Family history of ischemic heart disease and other diseases of the circulatory system: Secondary | ICD-10-CM

## 2020-04-29 DIAGNOSIS — Z823 Family history of stroke: Secondary | ICD-10-CM

## 2020-04-29 DIAGNOSIS — K219 Gastro-esophageal reflux disease without esophagitis: Secondary | ICD-10-CM | POA: Diagnosis present

## 2020-04-29 DIAGNOSIS — Z79899 Other long term (current) drug therapy: Secondary | ICD-10-CM | POA: Diagnosis not present

## 2020-04-29 DIAGNOSIS — Z902 Acquired absence of lung [part of]: Secondary | ICD-10-CM

## 2020-04-29 DIAGNOSIS — R911 Solitary pulmonary nodule: Secondary | ICD-10-CM | POA: Insufficient documentation

## 2020-04-29 DIAGNOSIS — R490 Dysphonia: Secondary | ICD-10-CM | POA: Diagnosis not present

## 2020-04-29 DIAGNOSIS — Z4682 Encounter for fitting and adjustment of non-vascular catheter: Secondary | ICD-10-CM

## 2020-04-29 DIAGNOSIS — Z87891 Personal history of nicotine dependence: Secondary | ICD-10-CM

## 2020-04-29 HISTORY — PX: LYMPH NODE DISSECTION: SHX5087

## 2020-04-29 HISTORY — PX: INTERCOSTAL NERVE BLOCK: SHX5021

## 2020-04-29 LAB — POCT I-STAT 7, (LYTES, BLD GAS, ICA,H+H)
Acid-Base Excess: 1 mmol/L (ref 0.0–2.0)
Bicarbonate: 26.3 mmol/L (ref 20.0–28.0)
Calcium, Ion: 1.14 mmol/L — ABNORMAL LOW (ref 1.15–1.40)
HCT: 35 % — ABNORMAL LOW (ref 36.0–46.0)
Hemoglobin: 11.9 g/dL — ABNORMAL LOW (ref 12.0–15.0)
O2 Saturation: 89 %
Patient temperature: 35.5
Potassium: 2.2 mmol/L — CL (ref 3.5–5.1)
Sodium: 141 mmol/L (ref 135–145)
TCO2: 28 mmol/L (ref 22–32)
pCO2 arterial: 42.5 mmHg (ref 32.0–48.0)
pH, Arterial: 7.394 (ref 7.350–7.450)
pO2, Arterial: 52 mmHg — ABNORMAL LOW (ref 83.0–108.0)

## 2020-04-29 LAB — ABO/RH: ABO/RH(D): O POS

## 2020-04-29 SURGERY — BLOCK, NERVE, INTERCOSTAL
Anesthesia: General | Site: Chest | Laterality: Right

## 2020-04-29 MED ORDER — MIDAZOLAM HCL 2 MG/2ML IJ SOLN
INTRAMUSCULAR | Status: AC
  Filled 2020-04-29: qty 2

## 2020-04-29 MED ORDER — ASPIRIN EC 81 MG PO TBEC
81.0000 mg | DELAYED_RELEASE_TABLET | Freq: Every day | ORAL | Status: DC
  Administered 2020-04-30 – 2020-05-01 (×2): 81 mg via ORAL
  Filled 2020-04-29 (×2): qty 1

## 2020-04-29 MED ORDER — PROPOFOL 10 MG/ML IV BOLUS
INTRAVENOUS | Status: AC
  Filled 2020-04-29: qty 20

## 2020-04-29 MED ORDER — LISINOPRIL-HYDROCHLOROTHIAZIDE 20-25 MG PO TABS: 1.0000 | ORAL_TABLET | Freq: Every day | ORAL | Status: DC

## 2020-04-29 MED ORDER — SODIUM CHLORIDE 0.9 % IV SOLN: INTRAVENOUS | Status: DC | PRN

## 2020-04-29 MED ORDER — EPHEDRINE 5 MG/ML INJ
INTRAVENOUS | Status: AC
  Filled 2020-04-29: qty 10

## 2020-04-29 MED ORDER — ORAL CARE MOUTH RINSE: 15.0000 mL | Freq: Once | OROMUCOSAL | Status: AC

## 2020-04-29 MED ORDER — LIDOCAINE 2% (20 MG/ML) 5 ML SYRINGE
INTRAMUSCULAR | Status: DC | PRN
  Administered 2020-04-29: 100 mg via INTRAVENOUS

## 2020-04-29 MED ORDER — DEXAMETHASONE SODIUM PHOSPHATE 10 MG/ML IJ SOLN
INTRAMUSCULAR | Status: AC
  Filled 2020-04-29: qty 1

## 2020-04-29 MED ORDER — SODIUM CHLORIDE 0.9 % IR SOLN
Status: DC | PRN
  Administered 2020-04-29: 1000 mL

## 2020-04-29 MED ORDER — ONDANSETRON HCL 4 MG/2ML IJ SOLN
INTRAMUSCULAR | Status: AC
  Filled 2020-04-29: qty 2

## 2020-04-29 MED ORDER — HYDROCHLOROTHIAZIDE 25 MG PO TABS
25.0000 mg | ORAL_TABLET | Freq: Every day | ORAL | Status: DC
  Administered 2020-04-29 – 2020-04-30 (×2): 25 mg via ORAL
  Filled 2020-04-29 (×2): qty 1

## 2020-04-29 MED ORDER — ACETAMINOPHEN 500 MG PO TABS
1000.0000 mg | ORAL_TABLET | Freq: Four times a day (QID) | ORAL | Status: DC
  Administered 2020-04-29 – 2020-05-01 (×5): 1000 mg via ORAL
  Filled 2020-04-29 (×4): qty 2

## 2020-04-29 MED ORDER — FENTANYL CITRATE (PF) 250 MCG/5ML IJ SOLN
INTRAMUSCULAR | Status: AC
  Filled 2020-04-29: qty 5

## 2020-04-29 MED ORDER — VANCOMYCIN HCL IN DEXTROSE 1-5 GM/200ML-% IV SOLN
1000.0000 mg | Freq: Two times a day (BID) | INTRAVENOUS | Status: AC
  Administered 2020-04-29: 1000 mg via INTRAVENOUS
  Filled 2020-04-29: qty 200

## 2020-04-29 MED ORDER — DEXAMETHASONE SODIUM PHOSPHATE 10 MG/ML IJ SOLN
INTRAMUSCULAR | Status: DC | PRN
  Administered 2020-04-29: 10 mg via INTRAVENOUS

## 2020-04-29 MED ORDER — VANCOMYCIN HCL IN DEXTROSE 1-5 GM/200ML-% IV SOLN
INTRAVENOUS | Status: AC
  Administered 2020-04-29: 1000 mg via INTRAVENOUS
  Filled 2020-04-29: qty 200

## 2020-04-29 MED ORDER — HEMOSTATIC AGENTS (NO CHARGE) OPTIME
TOPICAL | Status: DC | PRN
  Administered 2020-04-29 (×2): 1 via TOPICAL

## 2020-04-29 MED ORDER — LACTATED RINGERS IV SOLN: INTRAVENOUS | Status: DC | PRN

## 2020-04-29 MED ORDER — BISACODYL 5 MG PO TBEC
10.0000 mg | DELAYED_RELEASE_TABLET | Freq: Every day | ORAL | Status: DC
  Administered 2020-04-30: 10 mg via ORAL
  Filled 2020-04-29 (×2): qty 2

## 2020-04-29 MED ORDER — CHLORHEXIDINE GLUCONATE 0.12 % MT SOLN: 15.0000 mL | Freq: Once | OROMUCOSAL | Status: AC

## 2020-04-29 MED ORDER — VANCOMYCIN HCL IN DEXTROSE 1-5 GM/200ML-% IV SOLN: 1000.0000 mg | INTRAVENOUS | Status: AC

## 2020-04-29 MED ORDER — LIDOCAINE 2% (20 MG/ML) 5 ML SYRINGE
INTRAMUSCULAR | Status: AC
  Filled 2020-04-29: qty 5

## 2020-04-29 MED ORDER — BUPIVACAINE LIPOSOME 1.3 % IJ SUSP
INTRAMUSCULAR | Status: DC | PRN
  Administered 2020-04-29: 100 mL

## 2020-04-29 MED ORDER — LISINOPRIL 20 MG PO TABS
20.0000 mg | ORAL_TABLET | Freq: Every day | ORAL | Status: DC
  Administered 2020-04-29 – 2020-04-30 (×2): 20 mg via ORAL
  Filled 2020-04-29: qty 1

## 2020-04-29 MED ORDER — ONDANSETRON HCL 4 MG/2ML IJ SOLN
INTRAMUSCULAR | Status: DC | PRN
  Administered 2020-04-29: 4 mg via INTRAVENOUS

## 2020-04-29 MED ORDER — ROCURONIUM BROMIDE 10 MG/ML (PF) SYRINGE
PREFILLED_SYRINGE | INTRAVENOUS | Status: DC | PRN
  Administered 2020-04-29: 50 mg via INTRAVENOUS
  Administered 2020-04-29 (×2): 20 mg via INTRAVENOUS

## 2020-04-29 MED ORDER — SUGAMMADEX SODIUM 200 MG/2ML IV SOLN
INTRAVENOUS | Status: DC | PRN
  Administered 2020-04-29: 200 mg via INTRAVENOUS

## 2020-04-29 MED ORDER — ACETAMINOPHEN 160 MG/5ML PO SOLN: 1000.0000 mg | Freq: Four times a day (QID) | ORAL | Status: DC

## 2020-04-29 MED ORDER — DEXMEDETOMIDINE (PRECEDEX) IN NS 20 MCG/5ML (4 MCG/ML) IV SYRINGE
PREFILLED_SYRINGE | INTRAVENOUS | Status: DC | PRN
  Administered 2020-04-29: 12 ug via INTRAVENOUS
  Administered 2020-04-29: 8 ug via INTRAVENOUS

## 2020-04-29 MED ORDER — PHENYLEPHRINE HCL-NACL 10-0.9 MG/250ML-% IV SOLN
INTRAVENOUS | Status: DC | PRN
  Administered 2020-04-29: 40 ug/min via INTRAVENOUS

## 2020-04-29 MED ORDER — SODIUM CHLORIDE (PF) 0.9 % IJ SOLN
INTRAMUSCULAR | Status: AC
  Filled 2020-04-29: qty 20

## 2020-04-29 MED ORDER — LACTATED RINGERS IV SOLN: INTRAVENOUS | Status: DC

## 2020-04-29 MED ORDER — TRAMADOL HCL 50 MG PO TABS
50.0000 mg | ORAL_TABLET | Freq: Four times a day (QID) | ORAL | Status: DC | PRN
  Administered 2020-04-30 – 2020-05-01 (×3): 100 mg via ORAL
  Filled 2020-04-29 (×3): qty 2

## 2020-04-29 MED ORDER — FENTANYL CITRATE (PF) 100 MCG/2ML IJ SOLN
INTRAMUSCULAR | Status: DC | PRN
  Administered 2020-04-29 (×6): 50 ug via INTRAVENOUS
  Administered 2020-04-29: 100 ug via INTRAVENOUS

## 2020-04-29 MED ORDER — PROPOFOL 10 MG/ML IV BOLUS
INTRAVENOUS | Status: DC | PRN
  Administered 2020-04-29: 110 mg via INTRAVENOUS
  Administered 2020-04-29: 90 mg via INTRAVENOUS
  Administered 2020-04-29: 100 mg via INTRAVENOUS

## 2020-04-29 MED ORDER — SENNOSIDES-DOCUSATE SODIUM 8.6-50 MG PO TABS
1.0000 | ORAL_TABLET | Freq: Every day | ORAL | Status: DC
  Administered 2020-04-29: 1 via ORAL
  Filled 2020-04-29 (×2): qty 1

## 2020-04-29 MED ORDER — ROCURONIUM BROMIDE 10 MG/ML (PF) SYRINGE
PREFILLED_SYRINGE | INTRAVENOUS | Status: AC
  Filled 2020-04-29: qty 10

## 2020-04-29 MED ORDER — CHLORHEXIDINE GLUCONATE 0.12 % MT SOLN
OROMUCOSAL | Status: AC
  Administered 2020-04-29: 15 mL via OROMUCOSAL
  Filled 2020-04-29: qty 15

## 2020-04-29 MED ORDER — OXYBUTYNIN CHLORIDE ER 5 MG PO TB24
5.0000 mg | ORAL_TABLET | Freq: Every day | ORAL | Status: DC
  Administered 2020-04-29 – 2020-04-30 (×2): 5 mg via ORAL
  Filled 2020-04-29 (×2): qty 1

## 2020-04-29 MED ORDER — MONTELUKAST SODIUM 10 MG PO TABS
10.0000 mg | ORAL_TABLET | Freq: Every day | ORAL | Status: DC
  Administered 2020-04-30 – 2020-05-01 (×2): 10 mg via ORAL
  Filled 2020-04-29 (×2): qty 1

## 2020-04-29 MED ORDER — BUPIVACAINE HCL (PF) 0.5 % IJ SOLN
INTRAMUSCULAR | Status: AC
  Filled 2020-04-29: qty 30

## 2020-04-29 MED ORDER — 0.9 % SODIUM CHLORIDE (POUR BTL) OPTIME
TOPICAL | Status: DC | PRN
  Administered 2020-04-29 (×2): 1000 mL

## 2020-04-29 MED ORDER — LABETALOL HCL 5 MG/ML IV SOLN
INTRAVENOUS | Status: DC | PRN
  Administered 2020-04-29: 5 mg via INTRAVENOUS
  Administered 2020-04-29: 10 mg via INTRAVENOUS

## 2020-04-29 MED ORDER — EPHEDRINE SULFATE-NACL 50-0.9 MG/10ML-% IV SOSY
PREFILLED_SYRINGE | INTRAVENOUS | Status: DC | PRN
  Administered 2020-04-29: 10 mg via INTRAVENOUS

## 2020-04-29 MED ORDER — ONDANSETRON HCL 4 MG/2ML IJ SOLN: 4.0000 mg | Freq: Four times a day (QID) | INTRAMUSCULAR | Status: DC | PRN

## 2020-04-29 MED ORDER — ESCITALOPRAM OXALATE 10 MG PO TABS
10.0000 mg | ORAL_TABLET | Freq: Every day | ORAL | Status: DC
  Administered 2020-04-30 – 2020-05-01 (×2): 10 mg via ORAL
  Filled 2020-04-29 (×2): qty 1

## 2020-04-29 MED ORDER — BUPIVACAINE LIPOSOME 1.3 % IJ SUSP
20.0000 mL | Freq: Once | INTRAMUSCULAR | Status: DC
  Filled 2020-04-29: qty 20

## 2020-04-29 SURGICAL SUPPLY — 136 items
ADH SKN CLS APL DERMABOND .7 (GAUZE/BANDAGES/DRESSINGS) ×2
APL PRP STRL LF DISP 70% ISPRP (MISCELLANEOUS) ×2
BAG SPEC RTRVL LRG 6X4 10 (ENDOMECHANICALS)
BAG TISS RTRVL C300 12X14 (MISCELLANEOUS) ×2
BLADE CLIPPER SURG (BLADE) ×1 IMPLANT
BLADE SURG 11 STRL SS (BLADE) ×3 IMPLANT
BNDG COHESIVE 6X5 TAN STRL LF (GAUZE/BANDAGES/DRESSINGS) ×2 IMPLANT
CANISTER SUCT 3000ML PPV (MISCELLANEOUS) ×6 IMPLANT
CANNULA REDUC XI 12-8 STAPL (CANNULA) ×6
CANNULA REDUCER 12-8 DVNC XI (CANNULA) ×4 IMPLANT
CATH THORACIC 28FR (CATHETERS) ×2 IMPLANT
CATH THORACIC 28FR RT ANG (CATHETERS) IMPLANT
CATH THORACIC 36FR (CATHETERS) IMPLANT
CATH THORACIC 36FR RT ANG (CATHETERS) IMPLANT
CATH TROCAR 20FR (CATHETERS) IMPLANT
CHLORAPREP W/TINT 26 (MISCELLANEOUS) ×3 IMPLANT
CLIP VESOCCLUDE MED 6/CT (CLIP) IMPLANT
CNTNR URN SCR LID CUP LEK RST (MISCELLANEOUS) ×15 IMPLANT
CONN ST 1/4X3/8  BEN (MISCELLANEOUS)
CONN ST 1/4X3/8 BEN (MISCELLANEOUS) IMPLANT
CONN Y 3/8X3/8X3/8  BEN (MISCELLANEOUS)
CONN Y 3/8X3/8X3/8 BEN (MISCELLANEOUS) IMPLANT
CONT SPEC 4OZ STRL OR WHT (MISCELLANEOUS) ×30
COVER SURGICAL LIGHT HANDLE (MISCELLANEOUS) IMPLANT
DEFOGGER SCOPE WARMER CLEARIFY (MISCELLANEOUS) ×3 IMPLANT
DERMABOND ADVANCED (GAUZE/BANDAGES/DRESSINGS) ×1
DERMABOND ADVANCED .7 DNX12 (GAUZE/BANDAGES/DRESSINGS) ×2 IMPLANT
DISSECTOR BLUNT TIP ENDO 5MM (MISCELLANEOUS) IMPLANT
DRAIN CHANNEL 28F RND 3/8 FF (WOUND CARE) IMPLANT
DRAIN CHANNEL 32F RND 10.7 FF (WOUND CARE) IMPLANT
DRAPE ARM DVNC X/XI (DISPOSABLE) ×8 IMPLANT
DRAPE COLUMN DVNC XI (DISPOSABLE) ×2 IMPLANT
DRAPE CV SPLIT W-CLR ANES SCRN (DRAPES) ×3 IMPLANT
DRAPE DA VINCI XI ARM (DISPOSABLE) ×12
DRAPE DA VINCI XI COLUMN (DISPOSABLE) ×3
DRAPE ORTHO SPLIT 77X108 STRL (DRAPES) ×3
DRAPE SURG ORHT 6 SPLT 77X108 (DRAPES) ×2 IMPLANT
DRAPE WARM FLUID 44X44 (DRAPES) ×2 IMPLANT
ELECT BLADE 6.5 EXT (BLADE) IMPLANT
ELECT REM PT RETURN 9FT ADLT (ELECTROSURGICAL) ×3
ELECTRODE REM PT RTRN 9FT ADLT (ELECTROSURGICAL) ×2 IMPLANT
GAUZE KITTNER 4X10 (MISCELLANEOUS) ×2 IMPLANT
GAUZE KITTNER 4X5 RF (MISCELLANEOUS) ×6 IMPLANT
GAUZE KITTNER 4X8 (MISCELLANEOUS) IMPLANT
GAUZE SPONGE 4X4 12PLY STRL (GAUZE/BANDAGES/DRESSINGS) ×1 IMPLANT
GAUZE SPONGE 4X4 12PLY STRL LF (GAUZE/BANDAGES/DRESSINGS) ×1 IMPLANT
GLOVE BIO SURGEON STRL SZ 6.5 (GLOVE) ×1 IMPLANT
GLOVE BIO SURGEON STRL SZ7.5 (GLOVE) ×6 IMPLANT
GLOVE BIOGEL PI IND STRL 6.5 (GLOVE) IMPLANT
GLOVE BIOGEL PI INDICATOR 6.5 (GLOVE) ×3
GLOVE ECLIPSE 6.5 STRL STRAW (GLOVE) ×2 IMPLANT
GLOVE SURG SS PI 7.5 STRL IVOR (GLOVE) ×1 IMPLANT
GOWN STRL REUS W/ TWL LRG LVL3 (GOWN DISPOSABLE) ×4 IMPLANT
GOWN STRL REUS W/ TWL XL LVL3 (GOWN DISPOSABLE) ×5 IMPLANT
GOWN STRL REUS W/TWL 2XL LVL3 (GOWN DISPOSABLE) ×3 IMPLANT
GOWN STRL REUS W/TWL LRG LVL3 (GOWN DISPOSABLE) ×6
GOWN STRL REUS W/TWL XL LVL3 (GOWN DISPOSABLE) ×6
HEMOSTAT SURGICEL 2X14 (HEMOSTASIS) ×8 IMPLANT
IRRIGATION STRYKERFLOW (MISCELLANEOUS) IMPLANT
IRRIGATOR STRYKERFLOW (MISCELLANEOUS)
IRRIGATOR SUCT 8 DISP DVNC XI (IRRIGATION / IRRIGATOR) ×1 IMPLANT
IRRIGATOR SUCTION 8MM XI DISP (IRRIGATION / IRRIGATOR) ×3
IV NS 1000ML (IV SOLUTION) ×3
IV NS 1000ML BAXH (IV SOLUTION) ×1 IMPLANT
KIT BASIN OR (CUSTOM PROCEDURE TRAY) ×3 IMPLANT
KIT SUCTION CATH 14FR (SUCTIONS) IMPLANT
KIT TURNOVER KIT B (KITS) ×3 IMPLANT
LOOP VESSEL SUPERMAXI WHITE (MISCELLANEOUS) IMPLANT
NEEDLE 22X1 1/2 (OR ONLY) (NEEDLE) ×3 IMPLANT
NS IRRIG 1000ML POUR BTL (IV SOLUTION) ×8 IMPLANT
PACK CHEST (CUSTOM PROCEDURE TRAY) ×3 IMPLANT
PAD ARMBOARD 7.5X6 YLW CONV (MISCELLANEOUS) ×15 IMPLANT
PORT ACCESS TROCAR AIRSEAL 12 (TROCAR) ×2 IMPLANT
PORT ACCESS TROCAR AIRSEAL 5M (TROCAR) ×1
POUCH ENDO CATCH II 15MM (MISCELLANEOUS) IMPLANT
POUCH SPECIMEN RETRIEVAL 10MM (ENDOMECHANICALS) IMPLANT
RELOAD STAPLE 45 2.5 WHT DVNC (STAPLE) IMPLANT
RELOAD STAPLE 45 3.5 BLU DVNC (STAPLE) IMPLANT
RELOAD STAPLE 45 4.3 GRN DVNC (STAPLE) IMPLANT
RELOAD STAPLER 2.5X45 WHT DVNC (STAPLE) ×8 IMPLANT
RELOAD STAPLER 3.5X45 BLU DVNC (STAPLE) ×12 IMPLANT
RELOAD STAPLER 4.3X45 GRN DVNC (STAPLE) ×2 IMPLANT
RETRACTOR WOUND ALXS 19CM XSML (INSTRUMENTS) IMPLANT
RTRCTR WOUND ALEXIS 19CM XSML (INSTRUMENTS)
SCISSORS LAP 5X35 DISP (ENDOMECHANICALS) IMPLANT
SEAL CANN UNIV 5-8 DVNC XI (MISCELLANEOUS) ×4 IMPLANT
SEAL XI 5MM-8MM UNIVERSAL (MISCELLANEOUS) ×6
SEALANT PROGEL (MISCELLANEOUS) IMPLANT
SEALANT SURG COSEAL 4ML (VASCULAR PRODUCTS) IMPLANT
SEALANT SURG COSEAL 8ML (VASCULAR PRODUCTS) IMPLANT
SEALER LIGASURE MARYLAND 30 (ELECTROSURGICAL) IMPLANT
SET TRI-LUMEN FLTR TB AIRSEAL (TUBING) ×3 IMPLANT
SOLUTION ELECTROLUBE (MISCELLANEOUS) IMPLANT
SPONGE INTESTINAL PEANUT (DISPOSABLE) IMPLANT
SPONGE TONSIL TAPE 1 RFD (DISPOSABLE) IMPLANT
STAPLER 45 SUREFORM CVD (STAPLE) ×6
STAPLER 45 SUREFORM CVD DVNC (STAPLE) ×2 IMPLANT
STAPLER CANNULA SEAL DVNC XI (STAPLE) ×4 IMPLANT
STAPLER CANNULA SEAL XI (STAPLE) ×6
STAPLER RELOAD 2.5X45 WHITE (STAPLE) ×12
STAPLER RELOAD 2.5X45 WHT DVNC (STAPLE) ×8
STAPLER RELOAD 3.5X45 BLU DVNC (STAPLE) ×12
STAPLER RELOAD 3.5X45 BLUE (STAPLE) ×18
STAPLER RELOAD 4.3X45 GREEN (STAPLE) ×3
STAPLER RELOAD 4.3X45 GRN DVNC (STAPLE) ×2
STOPCOCK 4 WAY LG BORE MALE ST (IV SETS) ×3 IMPLANT
SUT MNCRL AB 3-0 PS2 18 (SUTURE) IMPLANT
SUT MON AB 2-0 CT1 36 (SUTURE) IMPLANT
SUT PDS AB 1 CTX 36 (SUTURE) IMPLANT
SUT PROLENE 4 0 RB 1 (SUTURE)
SUT PROLENE 4-0 RB1 .5 CRCL 36 (SUTURE) IMPLANT
SUT SILK  1 MH (SUTURE) ×3
SUT SILK 1 MH (SUTURE) ×2 IMPLANT
SUT SILK 1 TIES 10X30 (SUTURE) IMPLANT
SUT SILK 2 0 SH (SUTURE) IMPLANT
SUT SILK 2 0SH CR/8 30 (SUTURE) IMPLANT
SUT VIC AB 1 CTX 36 (SUTURE)
SUT VIC AB 1 CTX36XBRD ANBCTR (SUTURE) IMPLANT
SUT VIC AB 2-0 CT1 27 (SUTURE) ×3
SUT VIC AB 2-0 CT1 TAPERPNT 27 (SUTURE) ×2 IMPLANT
SUT VIC AB 3-0 SH 27 (SUTURE) ×9
SUT VIC AB 3-0 SH 27X BRD (SUTURE) ×4 IMPLANT
SUT VICRYL 0 TIES 12 18 (SUTURE) ×3 IMPLANT
SUT VICRYL 0 UR6 27IN ABS (SUTURE) ×6 IMPLANT
SUT VICRYL 2 TP 1 (SUTURE) IMPLANT
SYR 10ML LL (SYRINGE) ×3 IMPLANT
SYR 20ML LL LF (SYRINGE) ×3 IMPLANT
SYR 50ML LL SCALE MARK (SYRINGE) ×3 IMPLANT
SYSTEM RETRIEVAL ANCHOR 12 (MISCELLANEOUS) ×2 IMPLANT
SYSTEM SAHARA CHEST DRAIN ATS (WOUND CARE) ×3 IMPLANT
TAPE CLOTH 4X10 WHT NS (GAUZE/BANDAGES/DRESSINGS) ×3 IMPLANT
TAPE CLOTH SURG 4X10 WHT LF (GAUZE/BANDAGES/DRESSINGS) ×1 IMPLANT
TIP APPLICATOR SPRAY EXTEND 16 (VASCULAR PRODUCTS) IMPLANT
TOWEL GREEN STERILE (TOWEL DISPOSABLE) ×3 IMPLANT
TRAY FOLEY MTR SLVR 16FR STAT (SET/KITS/TRAYS/PACK) ×3 IMPLANT
TUBING EXTENTION W/L.L. (IV SETS) ×3 IMPLANT

## 2020-04-29 NOTE — H&P (Signed)
[address]       [place] [address]             [phone]       No changes since her clinic appointment  Vitals:   04/29/20 0547  BP: (!) 164/61  Pulse: 68  Resp: 17  Temp: 98 F (36.7 C)  SpO2: 99%   Alert NAD Sinus EWOB  OR today for Right RATS, RML wedge resection, possible lobectomy  Per my clinic note  Kimberly Beck Record #[id number] Date of Birth: [birth year]  Referring: Garner Nash, DO Primary Care: Hulan Fess, MD Primary Cardiologist: No primary care provider on file.  Chief Complaint:        Chief Complaint  Patient presents with  . Consult    Surgical consult, Chest CT 02/14/20, PET Scan 04/20/20, PFT's 04/16/20   . Lung Lesion    History of Present Illness:    Kimberly Beck 74 y.o. female referred by Dr. Valeta Harms for surgical evaluation of a right middle lobe pulmonary nodule.  She does have a history of smoking but quit in the 1980s, and has been followed by Dr. Valeta Harms for this nodule.  Over the last year the size has remained stable but it has changed in regards to the density.  She would like this removed for further evaluation.  Cross-sectional imaging she was also noted to have a large hiatal hernia as well as hypermetabolism at the base of her tongue when I PET scan.  She is not symptomatic from a gastrointestinal standpoint.  She denies any dysphagia or odontophagia.  She occasionally has some reflux but this is treated with Tums.  She has not been on any antacid medications.  She does admit to some early satiety, as well as some exertional dyspnea but is able to walk up several flights of stairs without much issue.       Zubrod Score: At the time of surgery this patient's most appropriate activity status/level should be described as: [x] ?    0    Normal activity, no symptoms [] ?    1    Restricted in physical strenuous activity but ambulatory, able to do out light work [] ?     2    Ambulatory and capable of self care, unable to do work activities, up and about               >50 % of waking hours                              [] ?    3    Only limited self care, in bed greater than 50% of waking hours [] ?    4    Completely disabled, no self care, confined to bed or chair [] ?    5    Moribund       Past Medical History:  Diagnosis Date  . Allergy   . DVT, lower extremity, recurrent, right ([place])   . HTN (hypertension)          Past Surgical History:  Procedure Laterality Date  . TUBAL LIGATION           Family History  Problem Relation Age of Onset  . Heart attack Father   . Stroke Maternal Grandfather      Social History       Tobacco Use  Smoking Status Former  Smoker  . Packs/day: 3.00  . Years: 25.00  . Pack years: 75.00  . Start date: 37  . Quit date: 23  . Years since quitting: 34.8  Smokeless Tobacco Never Used    Social History      Substance and Sexual Activity  Alcohol Use None          Allergies  Allergen Reactions  . Codeine Itching  . Penicillins     Dry rash          Current Outpatient Medications  Medication Sig Dispense Refill  . Azelastine HCl 0.15 % SOLN Place into both nostrils.    Marland Kitchen escitalopram (LEXAPRO) 10 MG tablet     . lisinopril-hydrochlorothiazide (ZESTORETIC) 20-25 MG tablet Take by mouth.    . montelukast (SINGULAIR) 10 MG tablet Take 10 mg by mouth daily.    Marland Kitchen oxybutynin (DITROPAN-XL) 5 MG 24 hr tablet Take 5 mg by mouth daily.     No current facility-administered medications for this visit.    Review of Systems  Constitutional: Negative.   Respiratory: Positive for shortness of breath.   Cardiovascular: Negative.   Gastrointestinal: Negative for abdominal pain, heartburn, nausea and vomiting.  Psychiatric/Behavioral: Positive for depression.     PHYSICAL EXAMINATION: BP 130/83   Pulse 98   Temp 97.8 F (36.6 C) (Skin)   Resp 20   Ht  5\' 5"  (1.651 m)   Wt 153 lb (69.4 kg)   SpO2 96% Comment: RA with  mask on  BMI 25.46 kg/m  Physical Exam Constitutional:      General: She is not in acute distress.    Appearance: Normal appearance. She is normal weight. She is not ill-appearing.  HENT:     Head: Normocephalic and atraumatic.  Eyes:     Extraocular Movements: Extraocular movements intact.     Conjunctiva/sclera: Conjunctivae normal.  Cardiovascular:     Rate and Rhythm: Normal rate.  Pulmonary:     Effort: Pulmonary effort is normal. No respiratory distress.  Abdominal:     General: Abdomen is flat. There is no distension.    Musculoskeletal:        General: Normal range of motion.     Cervical back: Normal range of motion.  Neurological:     General: No focal deficit present.     Mental Status: She is alert and oriented to person, place, and time.     Diagnostic Studies & Laboratory data:     Recent Radiology Findings:   NM PET Image Initial (PI) Skull Base To Thigh  Result Date: 04/21/2020 CLINICAL DATA:  Initial treatment strategy for right middle lobe pulmonary nodule. EXAM: NUCLEAR MEDICINE PET SKULL BASE TO THIGH TECHNIQUE: 7.8 mCi F-18 FDG was injected intravenously. Full-ring PET imaging was performed from the skull base to thigh after the radiotracer. CT data was obtained and used for attenuation correction and anatomic localization. Fasting blood glucose: 97 mg/dl COMPARISON:  Chest CT 02/14/2020 and 04/16/2019 FINDINGS: Mediastinal blood pool activity: SUV max 2.77 Liver activity: SUV max NA NECK: There is an area hypermetabolism noted along the left tongue base region. SUV max is 6.76. No obvious lesion is identified on the CT scan but recommend ENT consultation and direct visualization to exclude the possibility of neoplasm. No enlarged or hypermetabolic lymph nodes are identified. Incidental CT findings: Vascular calcifications noted. CHEST: No hypermetabolism is demonstrated in the right middle  lobe ground-glass lesion. SUV max is 1.02. No enlarged or hypermetabolic mediastinal or hilar lymph  nodes. No breast masses, supraclavicular or axillary adenopathy. Incidental CT findings: Stable advanced atherosclerotic calcifications involving the aorta and coronary arteries. Stable large hiatal hernia with a good portion of the stomach up in the chest. ABDOMEN/PELVIS: No abnormal hypermetabolic activity within the liver, pancreas, adrenal glands, or spleen. No hypermetabolic lymph nodes in the abdomen or pelvis. Incidental CT findings: Benign hepatic cysts are noted. Stable aortic calcifications. SKELETON: No focal hypermetabolic activity to suggest skeletal metastasis. Incidental CT findings: none IMPRESSION: 1. Right middle lobe ground-glass lesion does not demonstrate any hypermetabolism. Recommend continued CT surveillance. Follow-up noncontrast chest CT in 6 months suggested. 2. Area of moderate hypermetabolism in the left tongue base region without obvious mass. Recommend ENT consultation and direct visualization to exclude neoplasm. No neck adenopathy. Electronically Signed   By: Marijo Sanes M.D.   On: 04/21/2020 08:53       I have independently reviewed the above radiology studies  and reviewed the findings with the patient.   Recent Lab Findings: No results found for: WBC, HGB, HCT, PLT, GLUCOSE, CHOL, TRIG, HDL, LDLDIRECT, LDLCALC, ALT, AST, NA, K, CL, CREATININE, BUN, CO2, TSH, INR, GLUF, HGBA1C   PFTs: - FVC: 84% - FEV1: 83% -DLCO: 72%  Problem List: Right middle lobe pulmonary nodule Large hiatal hernia Hypermetabolism at the base of the PET scan.  Assessment / Plan:   74 year old female with a 1.8 cm right middle lobe pulmonary nodule that is developed a new density on serial scans.  It does not have significant uptake on the PET scan but given its size further evaluation is warranted.  We discussed several options which included a navigational bronchoscopy, and  surgical biopsy.  Given that the patient wants this removed regardless I think that we should proceed with the surgical biopsy.  It is located on the fissure and thus potentially will be easily accessible for a wedge resection if I am unable to wedge this out I think she will require lobectomy for the purposes of biopsy, and she is agreeable to proceed with that.  She is tentatively scheduled for April 29, 2020.  In regards to her hiatal hernia she does not have many symptoms from a dysphagia or reflux standpoint but she does admit to some early satiety and dyspnea.  On review of the images she was impressed by the findings and stated that she would like this addressed.  Once she recovers from her lung operation then we will get her on the schedule for a paraesophageal hernia repair.  In regards to the hypermetabolism at the base of her tongue, I have made a referral for ENT to evaluate this.

## 2020-04-29 NOTE — Plan of Care (Signed)
  Problem: Education: Goal: Knowledge of disease or condition will improve Outcome: Progressing   Problem: Activity: Goal: Risk for activity intolerance will decrease Outcome: Progressing   Problem: Cardiac: Goal: Will achieve and/or maintain hemodynamic stability Outcome: Progressing   Problem: Respiratory: Goal: Respiratory status will improve Outcome: Progressing   Problem: Pain Management: Goal: Pain level will decrease Outcome: Progressing   Problem: Clinical Measurements: Goal: Ability to maintain clinical measurements within normal limits will improve Outcome: Progressing Goal: Respiratory complications will improve Outcome: Progressing Goal: Cardiovascular complication will be avoided Outcome: Progressing

## 2020-04-29 NOTE — Op Note (Signed)
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 Procedure:  - Robotic assisted right video thoracoscopy -Right middle lobectomy - Mediastinal lymph node sampling - Intercostal nerve block  Surgeon and Role:      * Corisa Montini, Lucile Crater, MD - Primary    *M. Roddenberry, PA-C- assisting  Anesthesia  general EBL: 100 ml Blood Administration: None Specimen: Right middle lobe. Hilar lymph nodes. Levels 7 and 9 lymph nodes.  Drains: 54 F argyle chest tube in right chest Counts: correct   Indications:  74 year old female with a 1.8 cm right middle lobe pulmonary nodule that is developed a new density on serial scans. It does not have significant uptake on the PET scan but given its size further evaluation is warranted. We discussed several options which included a navigational bronchoscopy, and surgical biopsy. Given that the patient wants this removed regardless I think that we should proceed with the surgical biopsy. It is located on the fissure and thus potentially will be easily accessible for a wedge resection if I am unable to wedge this out I think she will require lobectomy for the purposes of biopsy, and she is agreeable to proceed with that.   Findings: Multiple adhesions of the upper lobe and middle lobe. There were both stuck to the chest wall to him anteriorly. I found an area that corresponded with on cross-sectional imaging but it was very close to the hilum. I was unable to perform a wedge resection due to concern for injuring the hilar vessels thus elected to proceed with a right middle lobectomy. There was some enlarged lymph nodes in the hilum and the nodule was stuck to the pulmonary artery. Careful dissection was performed to skeletonize the branch pulmonary  arteries.  Operative Technique: After the risks, benefits and alternatives were thoroughly discussed, the patient was brought to the operative theatre.  Anesthesia was induced, and the patient was then placed in a left lateral decubitus position and was prepped and draped in normal sterile fashion.  An appropriate surgical pause was performed, and pre-operative antibiotics were dosed accordingly.  We began by placing our 4 robotic ports in the the 7th intercostal space targeting the hilum of the lung.  A 30mm assistant port was placed in the 9th intercostal space in the anterior axillary line.  The robot was then docked and all instruments were passed under direct visualization.    The lung was then retracted superiorly, and the inferior pulmonary ligament was divided.  The hilum was mobilized anteriorly and posteriorly.  We identified the middle lobe vein, and after careful isolation, it was divided with a vascular stapler.  We next moved to the fissure and identified the pulmonary artery.  The right middle lobe pulmonary artery branches were then divided with a vascular load stapler.  The bronchus to the right middle lobe was then isolated.  After a test clamp, with good ventilation of the upper and lower lobes, the bronchus was then divided.  The fissure was completed, and the specimen was passed into an endocatch bag.  It was removed from the superior access site.    Lymph nodes were then sampled at levels 7 and 9. I explored the paratracheal area  but did not find any significant lymphadenopathy.  The chest was irrigated, and an air leak test was performed.  An intercostal nerve block was performed under direct visualization.  A 28 F chest with then placed, and we watch the remaining lobes re-expand.  The skin and soft tissue were closed with absorbable suture    The patient tolerated the procedure without any immediate complications, and was transferred to the PACU in stable condition.  Bryton Waight Bary Leriche

## 2020-04-29 NOTE — Anesthesia Postprocedure Evaluation (Signed)
Anesthesia Post Note  Patient: Kimberly Beck  Procedure(s) Performed: INTERCOSTAL NERVE BLOCK (Right ) XI ROBOTIC ASSISTED THORASCOPY-LOBECTOMY RIGHT MIDDLE LOBE (Right Chest) LYMPH NODE DISSECTION (Right )     Patient location during evaluation: PACU Anesthesia Type: General Level of consciousness: sedated and patient cooperative Pain management: pain level controlled Vital Signs Assessment: post-procedure vital signs reviewed and stable Respiratory status: spontaneous breathing Cardiovascular status: stable Anesthetic complications: no   No complications documented.  Last Vitals:  Vitals:   04/29/20 1530 04/29/20 1538  BP:  (!) 111/57  Pulse: 88 88  Resp: 12 (!) 9  Temp:    SpO2: 91% 96%    Last Pain:  Vitals:   04/29/20 1538  TempSrc:   PainSc: 0-No pain                 Nolon Nations

## 2020-04-29 NOTE — Transfer of Care (Signed)
Immediate Anesthesia Transfer of Care Note  Patient: Kimberly Beck  Procedure(s) Performed: INTERCOSTAL NERVE BLOCK (Right ) XI ROBOTIC ASSISTED THORASCOPY-LOBECTOMY RIGHT MIDDLE LOBE (Right Chest) LYMPH NODE DISSECTION (Right )  Patient Location: PACU  Anesthesia Type:General  Level of Consciousness: drowsy  Airway & Oxygen Therapy: Patient Spontanous Breathing and Patient connected to face mask oxygen  Post-op Assessment: Report given to RN and Post -op Vital signs reviewed and stable  Post vital signs: Reviewed and stable  Last Vitals:  Vitals Value Taken Time  BP 124/66 04/29/20 1208  Temp    Pulse 71 04/29/20 1211  Resp 11 04/29/20 1211  SpO2 99 % 04/29/20 1211  Vitals shown include unvalidated device data.  Last Pain:  Vitals:   04/29/20 0547  TempSrc: Oral         Complications: No complications documented.

## 2020-04-29 NOTE — Anesthesia Procedure Notes (Addendum)
Procedure Name: Intubation Date/Time: 04/29/2020 7:53 AM Performed by: Kyung Rudd, CRNA Pre-anesthesia Checklist: Patient identified, Emergency Drugs available, Suction available and Patient being monitored Patient Re-evaluated:Patient Re-evaluated prior to induction Oxygen Delivery Method: Circle system utilized Preoxygenation: Pre-oxygenation with 100% oxygen Induction Type: IV induction Ventilation: Mask ventilation without difficulty Laryngoscope Size: Glidescope Endobronchial tube: Left, Double lumen EBT, EBT position confirmed by auscultation and EBT position confirmed by fiberoptic bronchoscope and 35 Fr Number of attempts: 4 Airway Equipment and Method: Stylet and Video-laryngoscopy Placement Confirmation: ETT inserted through vocal cords under direct vision,  positive ETCO2 and breath sounds checked- equal and bilateral Tube secured with: Tape Dental Injury: Teeth and Oropharynx as per pre-operative assessment  Comments: Grade II view with MAC 3. Grade I view on Glidescope screen.

## 2020-04-29 NOTE — Anesthesia Procedure Notes (Signed)
Arterial Line Insertion Start/End10/28/2021 7:05 AM, 04/29/2020 7:15 AM Performed by: Kyung Rudd, CRNA, CRNA  Patient location: Pre-op. Preanesthetic checklist: patient identified, IV checked, site marked, risks and benefits discussed, surgical consent, monitors and equipment checked, pre-op evaluation and timeout performed Lidocaine 1% used for infiltration Left, radial was placed Catheter size: 20 G Hand hygiene performed , maximum sterile barriers used  and Seldinger technique used Allen's test indicative of satisfactory collateral circulation Attempts: 1 Procedure performed without using ultrasound guided technique. Following insertion, Biopatch and dressing applied. Post procedure assessment: normal  Patient tolerated the procedure well with no immediate complications.

## 2020-04-29 NOTE — Brief Op Note (Signed)
04/29/2020  11:35 AM  PATIENT:  Kimberly Beck  74 y.o. female  PRE-OPERATIVE DIAGNOSIS:  Right Pulmonary Nodule  POST-OPERATIVE DIAGNOSIS:  Right Pulmonary Nodule  PROCEDURE:  Procedure(s): INTERCOSTAL NERVE BLOCK (Right) XI ROBOTIC ASSISTED THORASCOPY-LOBECTOMY RIGHT MIDDLE LOBE (Right) LYMPH NODE DISSECTION (Right)  SURGEON:  Surgeon(s) and Role:    * Lightfoot, Lucile Crater, MD - Primary  PHYSICIAN ASSISTANT: Trine Fread  ANESTHESIA:   general  EBL:  25 mL   BLOOD ADMINISTERED:none  DRAINS: 48fr right pleural tube   LOCAL MEDICATIONS USED:  Exparel intercostal nerve block  SPECIMEN:  Source of Specimen:  Right middle lung lobe, multiple lymph nodes  DISPOSITION OF SPECIMEN:  PATHOLOGY  COUNTS:  YES  DICTATION: .Dragon Dictation  PLAN OF CARE: Admit to inpatient   PATIENT DISPOSITION:  PACU - hemodynamically stable.   Delay start of Pharmacological VTE agent (>24hrs) due to surgical blood loss or risk of bleeding: yes

## 2020-04-30 ENCOUNTER — Inpatient Hospital Stay (HOSPITAL_COMMUNITY): Payer: Medicare Other

## 2020-04-30 ENCOUNTER — Encounter (HOSPITAL_COMMUNITY): Payer: Self-pay | Admitting: Thoracic Surgery (Cardiothoracic Vascular Surgery)

## 2020-04-30 LAB — CBC
HCT: 35 % — ABNORMAL LOW (ref 36.0–46.0)
Hemoglobin: 10.9 g/dL — ABNORMAL LOW (ref 12.0–15.0)
MCH: 26.5 pg (ref 26.0–34.0)
MCHC: 31.1 g/dL (ref 30.0–36.0)
MCV: 85 fL (ref 80.0–100.0)
Platelets: 222 10*3/uL (ref 150–400)
RBC: 4.12 MIL/uL (ref 3.87–5.11)
RDW: 21.2 % — ABNORMAL HIGH (ref 11.5–15.5)
WBC: 13.8 10*3/uL — ABNORMAL HIGH (ref 4.0–10.5)
nRBC: 0 % (ref 0.0–0.2)

## 2020-04-30 LAB — BLOOD GAS, ARTERIAL
Acid-Base Excess: 5 mmol/L — ABNORMAL HIGH (ref 0.0–2.0)
Bicarbonate: 29 mmol/L — ABNORMAL HIGH (ref 20.0–28.0)
Drawn by: 511471
FIO2: 24
O2 Saturation: 98.5 %
Patient temperature: 36.9
pCO2 arterial: 42 mmHg (ref 32.0–48.0)
pH, Arterial: 7.453 — ABNORMAL HIGH (ref 7.350–7.450)
pO2, Arterial: 107 mmHg (ref 83.0–108.0)

## 2020-04-30 LAB — BASIC METABOLIC PANEL
Anion gap: 12 (ref 5–15)
BUN: 17 mg/dL (ref 8–23)
CO2: 26 mmol/L (ref 22–32)
Calcium: 8.2 mg/dL — ABNORMAL LOW (ref 8.9–10.3)
Chloride: 101 mmol/L (ref 98–111)
Creatinine, Ser: 1.22 mg/dL — ABNORMAL HIGH (ref 0.44–1.00)
GFR, Estimated: 47 mL/min — ABNORMAL LOW (ref >60)
Glucose, Bld: 164 mg/dL — ABNORMAL HIGH (ref 70–99)
Potassium: 2.9 mmol/L — ABNORMAL LOW (ref 3.5–5.1)
Sodium: 139 mmol/L (ref 135–145)

## 2020-04-30 LAB — SURGICAL PATHOLOGY

## 2020-04-30 MED ORDER — ACETAMINOPHEN 500 MG PO TABS
1000.0000 mg | ORAL_TABLET | Freq: Four times a day (QID) | ORAL | 0 refills | Status: DC | PRN
Start: 2020-04-30 — End: 2020-07-30

## 2020-04-30 MED ORDER — MENTHOL 3 MG MT LOZG
1.0000 | LOZENGE | OROMUCOSAL | Status: DC | PRN
  Filled 2020-04-30: qty 9

## 2020-04-30 MED ORDER — POTASSIUM CHLORIDE CRYS ER 10 MEQ PO TBCR
10.0000 meq | EXTENDED_RELEASE_TABLET | Freq: Every day | ORAL | Status: DC
  Administered 2020-05-01: 10 meq via ORAL
  Filled 2020-04-30: qty 1

## 2020-04-30 MED ORDER — POTASSIUM CHLORIDE ER 10 MEQ PO TBCR: 10.0000 meq | EXTENDED_RELEASE_TABLET | Freq: Every day | ORAL | 3 refills | Status: DC

## 2020-04-30 MED ORDER — POTASSIUM CHLORIDE 10 MEQ/100ML IV SOLN: 10.0000 meq | INTRAVENOUS | Status: DC

## 2020-04-30 MED ORDER — POTASSIUM CHLORIDE 10 MEQ/50ML IV SOLN: 10.0000 meq | INTRAVENOUS | Status: DC

## 2020-04-30 MED ORDER — POTASSIUM CHLORIDE CRYS ER 20 MEQ PO TBCR
60.0000 meq | EXTENDED_RELEASE_TABLET | Freq: Every day | ORAL | Status: DC
  Administered 2020-04-30: 60 meq via ORAL
  Filled 2020-04-30: qty 3

## 2020-04-30 MED ORDER — POTASSIUM CHLORIDE ER 10 MEQ PO TBCR: 10.0000 meq | EXTENDED_RELEASE_TABLET | Freq: Every day | ORAL | 0 refills | Status: DC

## 2020-04-30 NOTE — Discharge Summary (Signed)
Physician Discharge Summary  Patient ID: Kimberly Beck MRN: [id number] DOB/AGE: May 29, 1946 74 y.o.  Admit date: 04/29/2020 Discharge date: 05/01/2020  Admission Diagnoses:  Patient Active Problem List   Diagnosis Date Noted  . Lung nodule 04/23/2020   Discharge Diagnoses:   Patient Active Problem List   Diagnosis Date Noted  . S/P lobectomy of lung 04/29/2020  . 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  She has been followed by Dr. Valeta Harms.  Over the past year the nodule has remained stable in size, but its density has increased.  PET CT was obtained and showed the nodule to be hypermetabolic.  She was also noted to have a large hiatal hernia with hypermetabolic activity at the base of her tongue.  She is asymptomatic from this and denies symptoms of dysphagia and odontophagia.  It was felt surgical resection would be the best option for definitive diagnosis and she was referred to [facility] for evaluation.  She was evaluated by Dr. Kipp Brood who was in agreement surgical resection was indicated.  The risks and benefits of the procedure were explained to the patient and she was agreeable to proceed.  Hospital Course:   Kimberly Beck presented to [facility] on 04/29/2020.  She was taken to the operating room and underwent Robotic Assisted Video Assisted Thoroscopy with Right Middle Lobectomy, lymph node sampling, and intercostal nerve block.  She tolerated the procedure without difficulty, was extubated and taken to the progressive care unit in stable condition.  The patient did well post operatively.  Her chest tube was free from air leak.  Follow up CXR showed no evidence of pneumothorax.  Her chest tube was removed on 04/30/2020.  Follow up CXR showed atelectasis with no pneumothorax.  She was hypokalemic on  admission.  I suspect this is due to diuretic use with Zestoretic prior to admission.  She was supplemented accordingly and will be discharged home on a small daily dose.  Her pain is well controlled.  Her incisions are free from infection.  She is ambulating without difficulty.  She is medically stable for discharge home today... Of note, in her preoperative office visit, referral has been placed to ENT for the hypermetabolic activity at the base of her tongue.  Significant Diagnostic Studies: nuclear medicine:   IMPRESSION: 1. Right middle lobe ground-glass lesion does not demonstrate any hypermetabolism. Recommend continued CT surveillance. Follow-up noncontrast chest CT in 6 months suggested. 2. Area of moderate hypermetabolism in the left tongue base region without obvious mass. Recommend ENT consultation and direct visualization to exclude neoplasm. No neck adenopathy.  Treatments: surgery:   Pre-Op Dx: Right middle lobe pulmonary nodule Post-op Dx: Right middle lobe non-small cell lung cancer. Procedure:  - Robotic assisted right video thoracoscopy -Right middle lobectomy - Mediastinal lymph node sampling - Intercostal nerve block  Discharge Exam: Blood pressure (!) 80/54, pulse (!) 107, temperature 98 F (36.7 C), temperature source Axillary, resp. rate (!) 21, height 5\' 5"  (1.651 m), weight 70.8 kg, SpO2 98 %.  General appearance: alert, cooperative and no distress Heart: regular rate and rhythm Lungs: mildly dim in bases Abdomen: benign Extremities: no edema Wound: incis healing well  Discharge Medications:  Allergies as of 05/01/2020      Reactions   Codeine Itching   Penicillins Rash   Dry  rash      Medication List    TAKE these medications   acetaminophen 500 MG tablet Commonly known as: TYLENOL Take 2 tablets (1,000 mg total) by mouth every 6 (six) hours as needed.   aspirin EC 81 MG tablet Take 81 mg by mouth daily. Swallow whole.   Azelastine HCl  0.15 % Soln Place 1 spray into both nostrils in the morning and at bedtime.   escitalopram 10 MG tablet Commonly known as: LEXAPRO Take 10 mg by mouth daily.   ferrous sulfate 325 (65 FE) MG tablet Take 325 mg by mouth 2 (two) times daily with a meal.   lisinopril-hydrochlorothiazide 20-25 MG tablet Commonly known as: ZESTORETIC Take 1 tablet by mouth daily.   montelukast 10 MG tablet Commonly known as: SINGULAIR Take 10 mg by mouth daily.   multivitamin tablet Take 1 tablet by mouth daily.   oxybutynin 5 MG 24 hr tablet Commonly known as: DITROPAN-XL Take 5 mg by mouth at bedtime.   potassium chloride 10 MEQ tablet Commonly known as: KLOR-CON Take 1 tablet (10 mEq total) by mouth daily.   traMADol 50 MG tablet Commonly known as: ULTRAM Take 1 tablet (50 mg total) by mouth every 6 (six) hours as needed for up to 7 days (mild pain).   Vitamin D 50 MCG (2000 UT) Caps Take 2,000 Units by mouth daily.       Follow-up Information    Lightfoot, Lucile Crater, MD Follow up in 1 week(s).   Specialty: Cardiothoracic Surgery Contact information: [place] [address] [phone]               Signed: John Giovanni PA-C 05/01/2020, 10:11 AM

## 2020-04-30 NOTE — Discharge Instructions (Signed)
Video-Assisted Thoracic Surgery, Care After This sheet gives you information about how to care for yourself after your procedure. Your health care provider may also give you more specific instructions. If you have problems or questions, contact your health care provider. What can I expect after the procedure? After the procedure, it is common to have:  Some pain and soreness in your chest.  Pain when breathing in (inhaling) and coughing.  Constipation.  Fatigue.  Difficulty sleeping. Follow these instructions at home: Preventing pneumonia  Take deep breaths or do breathing exercises as instructed by your health care provider. Doing this helps prevent lung infection (pneumonia).  Cough frequently. Coughing may cause discomfort, but it is important to clear mucus (phlegm) and expand your lungs. If it hurts to cough, hold a pillow against your chest or place the palms of both hands on top of the incision (use splinting) when you cough. This may help relieve discomfort.  If you were given an incentive spirometer, use it as directed. An incentive spirometer is a tool that measures how well you are filling your lungs with each breath.  Participate in pulmonary rehabilitation as directed by your health care provider. This is a program that combines education, exercise, and support from a team of specialists. The goal is to help you heal and get back to your normal activities as soon as possible. Medicines  Take over-the-counter or prescription medicines only as told by your health care provider.  If you have pain, take pain-relieving medicine before your pain becomes severe. This is important because if your pain is under control, you will be able to breathe and cough more comfortably.  If you were prescribed an antibiotic medicine, take it as told by your health care provider. Do not stop taking the antibiotic even if you start to feel better. Activity  Ask your health care provider what  activities are safe for you.  Avoid activities that use your chest muscles for at least 3-4 weeks.  Do not lift anything that is heavier than 10 lb (4.5 kg), or the limit that your health care provider tells you, until he or she says that it is safe. Incision care  Follow instructions from your health care provider about how to take care of your incision(s). Make sure you: ? Wash your hands with soap and water before you change your bandage (dressing). If soap and water are not available, use hand sanitizer. ? Change your dressing as told by your health care provider. ? Leave stitches (sutures), skin glue, or adhesive strips in place. These skin closures may need to stay in place for 2 weeks or longer. If adhesive strip edges start to loosen and curl up, you may trim the loose edges. Do not remove adhesive strips completely unless your health care provider tells you to do that.  Keep your dressing dry until it has been removed.  Check your incision area every day for signs of infection. Check for: ? Redness, swelling, or pain. ? Fluid or blood. ? Warmth. ? Pus or a bad smell. Bathing  Do not take baths, swim, or use a hot tub until your health care provider approves. You may take showers.  After your dressing has been removed, use soap and water to gently wash your incision area. Do not use anything else to clean your incision(s) unless your health care provider tells you to do this. Driving   Do not drive until your health care provider approves.  Do not drive or  use heavy machinery while taking prescription pain medicine. Eating and drinking  Eat a healthy, balanced diet as instructed by your health care provider. A healthy diet includes plenty of fresh fruits and vegetables, whole grains, and low-fat (lean) proteins.  Limit foods that are high in fat and processed sugars, such as fried and sweet foods.  Drink enough fluid to keep your urine clear or pale yellow. General  instructions   To prevent or treat constipation while you are taking prescription pain medicine, your health care provider may recommend that you: ? Take over-the-counter or prescription medicines. ? Eat foods that are high in fiber, such as beans, fresh fruits and vegetables, and whole grains.  Do not use any products that contain nicotine or tobacco, such as cigarettes and e-cigarettes. If you need help quitting, ask your health care provider.  Avoid secondhand smoke.  Wear compression stockings as told by your health care provider. These stockings help to prevent blood clots and reduce swelling in your legs.  If you have a chest tube, care for it as instructed by your health care provider. Do not travel by airplane during the 2 weeks after your chest tube is removed, or until your health care provider says that this is safe.  Keep all follow-up visits as told by your health care provider. This is important. Contact a health care provider if:  You have redness, swelling, or pain around an incision.  You have fluid or blood coming from an incision.  Your incision area feels warm to the touch.  You have pus or a bad smell coming from an incision.  You have a fever or chills.  You have nausea or vomiting.  You have pain that does not get better with medicine. Get help right away if:  You have chest pain.  Your heart is fluttering or beating rapidly.  You develop a rash.  You have shortness of breath or trouble breathing.  You are confused.  You have trouble speaking.  You feel weak, light-headed, or dizzy.  You faint. Summary  To help prevent lung infection (pneumonia), take deep breaths or do breathing exercises as instructed by your health care provider.  Cough frequently to clear mucus (phlegm) and expand your lungs. If it hurts to cough, hold a pillow against your chest or place the palms of both hands on top of the incision (use splinting) when you cough.  If  you have pain, take pain-relieving medicine before your pain becomes severe. This is important because if your pain is under control, you will be able to breathe and cough more comfortably.  Ask your health care provider what activities are safe for you. This information is not intended to replace advice given to you by your health care provider. Make sure you discuss any questions you have with your health care provider. Document Revised: 06/01/2017 Document Reviewed: 05/29/2016 Elsevier Patient Education  2020 [employer].

## 2020-04-30 NOTE — Plan of Care (Signed)

## 2020-04-30 NOTE — Progress Notes (Signed)
Right side chest tube removed without difficulty, sutures tied and tagaderm dressing applied.  Patient tolerated procedure well.

## 2020-04-30 NOTE — Progress Notes (Addendum)
[address]       [place], [address]             [phone]      1 Day Post-Op Procedure(s) (LRB): INTERCOSTAL NERVE BLOCK (Right) XI ROBOTIC ASSISTED THORASCOPY-LOBECTOMY RIGHT MIDDLE LOBE (Right) LYMPH NODE DISSECTION (Right)   Subjective:  Patient doing okay.  Having some hoarseness, doesn't complain of a sore throat.  States the IV in her right arm is painful  Objective: Vital signs in last 24 hours: Temp:  [97.7 F (36.5 C)-98.8 F (37.1 C)] 98.4 F (36.9 C) (10/29 0725) Pulse Rate:  [66-95] 84 (10/29 0725) Cardiac Rhythm: Normal sinus rhythm (10/29 0400) Resp:  [7-18] 14 (10/29 0725) BP: (95-130)/(54-74) 109/63 (10/29 0725) SpO2:  [91 %-100 %] 96 % (10/29 0725) Arterial Line BP: (106-143)/(49-68) 141/62 (10/28 1500)  Intake/Output from previous day: 10/28 0701 - 10/29 0700 In: 1500 [I.V.:1500] Out: 1026 [Urine:820; Blood:100; Chest Tube:106]  General appearance: alert, cooperative and no distress Heart: regular rate and rhythm Lungs: clear to auscultation bilaterally Abdomen: soft, non-tender; bowel sounds normal; no masses,  no organomegaly Extremities: extremities normal, atraumatic, no cyanosis or edema Wound: clean and dry, right AC swollen, erythematous  Lab Results: Recent Labs    04/27/20 0841 04/27/20 0841 04/29/20 0945 04/30/20 0054  WBC 7.7  --   --  13.8*  HGB 12.6   < > 11.9* 10.9*  HCT 41.0   < > 35.0* 35.0*  PLT 292  --   --  222   < > = values in this interval not displayed.   BMET:  Recent Labs    04/27/20 0841 04/27/20 0841 04/29/20 0945 04/30/20 0054  NA 141   < > 141 139  K 3.1*   < > 2.2* 2.9*  CL 105  --   --  101  CO2 23  --   --  26  GLUCOSE 128*  --   --  164*  BUN 19  --   --  17  CREATININE 1.20*  --   --  1.22*  CALCIUM 9.1  --   --  8.2*   < > = values in this interval not displayed.    PT/INR:  Recent Labs    04/27/20 0841  LABPROT 12.2  INR 0.9   ABG    Component Value  Date/Time   PHART 7.453 (H) 04/30/2020 0527   HCO3 29.0 (H) 04/30/2020 0527   TCO2 28 04/29/2020 0945   O2SAT 98.5 04/30/2020 0527   CBG (last 3)  No results for input(s): GLUCAP in the last 72 hours.  Assessment/Plan: S/P Procedure(s) (LRB): INTERCOSTAL NERVE BLOCK (Right) XI ROBOTIC ASSISTED THORASCOPY-LOBECTOMY RIGHT MIDDLE LOBE (Right) LYMPH NODE DISSECTION (Right)  1. CV- NSR, BP stable- home zestoretic restarted 2. Pulm- no air leak present, CXR free from pneumothorax, ? D/c chest tube today vs tomorrow? 3. Hypokalemia- severe, K is at 2.9, will give 60 meq this morning, repeat BMET in AM 4. Expected post operative blood loss anemia, mild hgb at 10.9 5. Hoarseness- likely due to trauma from ET tube, will monitor 6. Lovenox for DVT 7. Dispo- patient stable, supplement K, no air leak from chest tube, possibly remove today, repeat CXR in AM   LOS: 1 day    Ellwood Handler, PA-C 04/30/2020  Agree with above. Patient doing well. No leak on exam today.  It is well expanded on chest x-ray. Remove chest tube today. Patient wants to  stay 1 more day to assess pain level. Discharge tomorrow.  Kilah Drahos Bary Leriche

## 2020-05-01 ENCOUNTER — Inpatient Hospital Stay (HOSPITAL_COMMUNITY): Payer: Medicare Other

## 2020-05-01 LAB — BASIC METABOLIC PANEL
Anion gap: 8 (ref 5–15)
BUN: 16 mg/dL (ref 8–23)
CO2: 30 mmol/L (ref 22–32)
Calcium: 8.5 mg/dL — ABNORMAL LOW (ref 8.9–10.3)
Chloride: 105 mmol/L (ref 98–111)
Creatinine, Ser: 0.97 mg/dL (ref 0.44–1.00)
GFR, Estimated: >60 mL/min (ref >60)
Glucose, Bld: 103 mg/dL — ABNORMAL HIGH (ref 70–99)
Potassium: 3.3 mmol/L — ABNORMAL LOW (ref 3.5–5.1)
Sodium: 143 mmol/L (ref 135–145)

## 2020-05-01 LAB — CBC
HCT: 34.9 % — ABNORMAL LOW (ref 36.0–46.0)
Hemoglobin: 10.5 g/dL — ABNORMAL LOW (ref 12.0–15.0)
MCH: 26.2 pg (ref 26.0–34.0)
MCHC: 30.1 g/dL (ref 30.0–36.0)
MCV: 87 fL (ref 80.0–100.0)
Platelets: 215 10*3/uL (ref 150–400)
RBC: 4.01 MIL/uL (ref 3.87–5.11)
RDW: 21.5 % — ABNORMAL HIGH (ref 11.5–15.5)
WBC: 8 10*3/uL (ref 4.0–10.5)
nRBC: 0 % (ref 0.0–0.2)

## 2020-05-01 MED ORDER — TRAMADOL HCL 50 MG PO TABS: 50.0000 mg | ORAL_TABLET | Freq: Four times a day (QID) | ORAL | 0 refills | Status: AC | PRN

## 2020-05-01 MED ORDER — POTASSIUM CHLORIDE CRYS ER 20 MEQ PO TBCR: 40.0000 meq | EXTENDED_RELEASE_TABLET | Freq: Once | ORAL | Status: DC

## 2020-05-01 NOTE — Progress Notes (Signed)
Discharge instructions reviewed with patient, questions answered and follow up appointments reviewed.  Patient verbalized understanding. She is aware she needs to check her blood pressure prior to taking her zestoril.  Patient via wheelchair to waiting car at the main entrance of the hospital in stable condition

## 2020-05-01 NOTE — Plan of Care (Signed)

## 2020-05-01 NOTE — Progress Notes (Signed)
   05/01/20 0733  Assess: MEWS Score  Temp 98 F (36.7 C)  BP (!) 90/40  Pulse Rate (!) 106  ECG Heart Rate (!) 106  Resp 16  Level of Consciousness Alert  SpO2 96 %  O2 Device Nasal Cannula  Patient Activity (if Appropriate) In bed  O2 Flow Rate (L/min) 1 L/min  Assess: MEWS Score  MEWS Temp 0  MEWS Systolic 1  MEWS Pulse 1  MEWS RR 0  MEWS LOC 0  MEWS Score 2  MEWS Score Color Yellow  Assess: if the MEWS score is Yellow or Red  Were vital signs taken at a resting state? Yes  Focused Assessment No change from prior assessment  Early Detection of Sepsis Score *See Row Information* Low  MEWS guidelines implemented *See Row Information* No, vital signs rechecked  Treat  MEWS Interventions  (blood pressure cuff changed. patient asymptomatic, pain meds)  Pain Scale 0-10  Pain Score 0  Notify: Charge Nurse/RN  Name of Charge Nurse/RN Notified Pamala Hurry, April, [place]  Date Charge Nurse/RN Notified 05/01/20  Document  Progress note created (see row info) Yes

## 2020-05-01 NOTE — Progress Notes (Signed)
[address]       [place] [address]             [phone]      2 Days Post-Op Procedure(s) (LRB): INTERCOSTAL NERVE BLOCK (Right) XI ROBOTIC ASSISTED THORASCOPY-LOBECTOMY RIGHT MIDDLE LOBE (Right) LYMPH NODE DISSECTION (Right) Subjective: Feels well, still hoarse  Objective: Vital signs in last 24 hours: Temp:  [97.9 F (36.6 C)-98.5 F (36.9 C)] 98 F (36.7 C) (10/30 0733) Pulse Rate:  [67-107] 107 (10/30 0747) Cardiac Rhythm: Normal sinus rhythm (10/30 0700) Resp:  [13-21] 21 (10/30 0747) BP: (80-135)/(40-73) 80/54 (10/30 0747) SpO2:  [87 %-99 %] 98 % (10/30 0747)  Hemodynamic parameters for last 24 hours:    Intake/Output from previous day: 10/29 0701 - 10/30 0700 In: 560 [P.O.:560] Out: -  Intake/Output this shift: No intake/output data recorded.  General appearance: alert, cooperative and no distress Heart: regular rate and rhythm Lungs: mildly dim in bases Abdomen: benign Extremities: no edema Wound: incis healing well  Lab Results: Recent Labs    04/30/20 0054 05/01/20 0232  WBC 13.8* 8.0  HGB 10.9* 10.5*  HCT 35.0* 34.9*  PLT 222 215   BMET:  Recent Labs    04/30/20 0054 05/01/20 0232  NA 139 143  K 2.9* 3.3*  CL 101 105  CO2 26 30  GLUCOSE 164* 103*  BUN 17 16  CREATININE 1.22* 0.97  CALCIUM 8.2* 8.5*    PT/INR: No results for input(s): LABPROT, INR in the last 72 hours. ABG    Component Value Date/Time   PHART 7.453 (H) 04/30/2020 0527   HCO3 29.0 (H) 04/30/2020 0527   TCO2 28 04/29/2020 0945   O2SAT 98.5 04/30/2020 0527   CBG (last 3)  No results for input(s): GLUCAP in the last 72 hours.  Meds Scheduled Meds: . acetaminophen  1,000 mg Oral Q6H   Or  . acetaminophen (TYLENOL) oral liquid 160 mg/5 mL  1,000 mg Oral Q6H  . aspirin EC  81 mg Oral Daily  . bisacodyl  10 mg Oral Daily  . escitalopram  10 mg Oral Daily  . lisinopril  20 mg Oral Daily   And  . hydrochlorothiazide  25 mg Oral Daily   . montelukast  10 mg Oral Daily  . oxybutynin  5 mg Oral QHS  . potassium chloride  10 mEq Oral Daily  . senna-docusate  1 tablet Oral QHS   Continuous Infusions: PRN Meds:.menthol-cetylpyridinium, ondansetron (ZOFRAN) IV, traMADol  Xrays DG Chest 2 View  Result Date: 05/01/2020 CLINICAL DATA:  Chest tube removal EXAM: CHEST - 2 VIEW COMPARISON:  04/30/2020 FINDINGS: Right chest tube has been removed. No pneumothorax. Increased atelectasis at the right lung base. Trace right pleural effusion. Similar left basilar atelectasis. Stable cardiomediastinal contours. Large hiatal hernia. Residual right chest wall emphysema. Chronic L1 compression fracture. Nonspecific small air-fluid levels in the abdomen. IMPRESSION: Interval removal of right chest tube. No pneumothorax. Increased right basilar atelectasis. Trace right pleural effusion. Similar left basilar atelectasis. Electronically Signed   By: Macy Mis M.D.   On: 05/01/2020 08:10   DG CHEST PORT 1 VIEW  Result Date: 04/30/2020 CLINICAL DATA:  Postop right lobectomy.  Chest tube. EXAM: PORTABLE CHEST 1 VIEW COMPARISON:  Yesterday FINDINGS: Right apical chest tube in place. No visible pneumothorax. Small volume chest wall gas on the right. Normal heart size. Hiatal hernia. Mild atelectasis at the bases. IMPRESSION: Stable chest tube with no visible pneumothorax. Electronically  Signed   By: Monte Fantasia M.D.   On: 04/30/2020 07:17   DG Chest Port 1 View  Result Date: 04/29/2020 CLINICAL DATA:  Postoperative lobectomy EXAM: PORTABLE CHEST 1 VIEW COMPARISON:  PET-CT April 20, 2020 and chest radiograph April 27, 2020 FINDINGS: Chest tube present on the right without pneumothorax. Areas of ill-defined opacity noted in the left mid lung, likely due to atelectasis. Heart size and pulmonary vascularity are normal. Sizable paraesophageal hernia noted. No bone lesions. IMPRESSION: Chest tube on the right without pneumothorax. No appreciable  volume loss evident on the right. Atelectasis left mid lung. Heart size within normal limits. Large paraesophageal type hernia noted. No adenopathy appreciable by radiography. Electronically Signed   By: Lowella Grip III M.D.   On: 04/29/2020 12:44    Assessment/Plan: S/P Procedure(s) (LRB): INTERCOSTAL NERVE BLOCK (Right) XI ROBOTIC ASSISTED THORASCOPY-LOBECTOMY RIGHT MIDDLE LOBE (Right) LYMPH NODE DISSECTION (Right)  POD#2 1 afeb, VSS , BP does run low, I told her to hold lisinopril for SBP < 120. She has a monitor at home 2 sats god on RA 3 CXR no pntx, some increase in right atx 4 hypokalemia improved- will cont to replace K+  5 expected ABLA is stable 6 leukocytosis is resolved 7 stable for d/c home , reviewed instructions  LOS: 2 days    Gaspar Bidding Pager [phone] 05/01/2020

## 2020-05-06 ENCOUNTER — Other Ambulatory Visit: Payer: Self-pay | Admitting: *Deleted

## 2020-05-06 NOTE — Progress Notes (Signed)
The proposed treatment discussed in cancer conference 05/06/20 is for discussion purpose only and is not a binding recommendation.  The patient was not physically examined nor present for their treatment options.  Therefore, final treatment plans cannot be decided.

## 2020-05-07 ENCOUNTER — Encounter: Payer: Self-pay | Admitting: Thoracic Surgery (Cardiothoracic Vascular Surgery)

## 2020-05-07 ENCOUNTER — Ambulatory Visit (HOSPITAL_COMMUNITY)
Admission: RE | Admit: 2020-05-07 | Discharge: 2020-05-07 | Disposition: A | Discharge status: Home / Self Care | Payer: Medicare Other | Source: Ambulatory Visit | Attending: Nurse Practitioner | Admitting: Nurse Practitioner

## 2020-05-07 ENCOUNTER — Ambulatory Visit (INDEPENDENT_AMBULATORY_CARE_PROVIDER_SITE_OTHER): Payer: Self-pay | Admitting: Thoracic Surgery (Cardiothoracic Vascular Surgery)

## 2020-05-07 ENCOUNTER — Encounter: Payer: Medicare Other | Admitting: Thoracic Surgery (Cardiothoracic Vascular Surgery)

## 2020-05-07 ENCOUNTER — Other Ambulatory Visit: Payer: Self-pay

## 2020-05-07 VITALS — BP 144/90 | HR 133 | Resp 18 | Ht 65.0 in | Wt 156.0 lb

## 2020-05-07 DIAGNOSIS — I491 Atrial premature depolarization: Secondary | ICD-10-CM | POA: Insufficient documentation

## 2020-05-07 DIAGNOSIS — Z902 Acquired absence of lung [part of]: Secondary | ICD-10-CM

## 2020-05-07 DIAGNOSIS — I4891 Unspecified atrial fibrillation: Secondary | ICD-10-CM | POA: Insufficient documentation

## 2020-05-07 NOTE — Progress Notes (Signed)
Ekg done for Dr. Kipp Brood as pt was in his office for post op check and found to have an elevated HR. They asked if she could be seen here for EKG. It shows sinus tach at 116 bpm. Dr. Kipp Brood reviewed the ekg and gave ok for pt to leave afib office.

## 2020-05-07 NOTE — Progress Notes (Signed)
      [address]       [place] [address]             [phone]        Quinnley P [facility] Medical Record #[id number] Date of Birth: Apr 24, 1946  Referring: Garner Nash, DO Primary Care: Hulan Fess, MD Primary Cardiologist:No primary care provider on file.  Reason for visit:   follow-up  History of Present Illness:     Mrs. Soots comes in today and is overall been doing well.  She does complain of some incisional pain.  Her respiratory status has been good at home.  She is pulling about 1500 mL on the incentive spirometer.  Physical Exam: BP (!) 144/90 (BP Location: Left Arm, Patient Position: Sitting)   Pulse (!) 133   Resp 18   Ht 5\' 5"  (1.651 m)   Wt 156 lb (70.8 kg)   SpO2 96% Comment: RA with mask on  BMI 25.96 kg/m   Alert NAD Incision clean.   Abdomen soft, ND No peripheral edema   Diagnostic Studies & Laboratory data:  Path:  FINAL MICROSCOPIC DIAGNOSIS:   A. LUNG, RIGHT MIDDLE LOBE, LOBECTOMY:  - Adenocarcinoma, 0.6 cm.  - Margins not involved.  - Tumor involves subpleural connective tissue.  - Peribronchial lymph node with no metastatic carcinoma (0/1).   B. LYMPH NODE, HILAR, EXCISION:  - Anthracotic lymph node with no metastatic carcinoma (0/1).   C. LYMPH NODE, HILAR #2, EXCISION:  - Anthracotic lymph node with no metastatic carcinoma (0/1).   D. LYMPH NODE, HILAR #3, EXCISION:  - Anthracotic lymph node with no metastatic carcinoma (0/1).   E. LYMPH NODE, HILAR #4, EXCISION:  - Anthracotic lymph node with no metastatic carcinoma (0/1).   F. LYMPH NODE, 9, EXCISION:  - Anthracotic lymph node with no metastatic carcinoma (0/1).   G. LYMPH NODE, 7, EXCISION:  - Anthracotic lymph node with no metastatic carcinoma (0/1).   H. LYMPH NODE, 7 #2, EXCISION:  - Anthracotic lymph node with no metastatic carcinoma (0/1).   ONCOLOGY TABLE:  LUNG: Resection  Procedure: Right middle lobectomy and lymph node  biopsies.  Specimen Laterality: Right middle lobe.  Tumor Site: Right middle lobe.  Tumor Size: 0.6 x 0.5 x 0.5 cm.  Tumor Focality: Unifocal.  Histologic Type: Adenocarcinoma.  Visceral Pleura Invasion: Not identified. Tumor involves subpleural  connective tissue.  Lymphovascular Invasion: Not identified.  Margins: Not involved by tumor.  Treatment Effect: No known presurgical therapy.  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  Today she is quite tachycardic thus I have sent her to the atrial fibrillation clinic for a formal EKG.  The results did show sinus tachycardia with occasional PACs.  She is not symptomatic.  Her case was also discussed in our tumor board and there is no need for any adjuvant therapy.  She will follow-up in 1 month with a chest x-ray.   Lajuana Matte 05/07/2020 2:25 PM

## 2020-05-20 ENCOUNTER — Other Ambulatory Visit: Payer: Self-pay

## 2020-05-20 ENCOUNTER — Ambulatory Visit: Payer: Medicare Other | Admitting: Pulmonary Disease

## 2020-05-20 ENCOUNTER — Encounter: Payer: Self-pay | Admitting: Pulmonary Disease

## 2020-05-20 VITALS — BP 108/60 | HR 107 | Temp 98.1°F | Wt 152.0 lb

## 2020-05-20 DIAGNOSIS — R918 Other nonspecific abnormal finding of lung field: Secondary | ICD-10-CM

## 2020-05-20 DIAGNOSIS — C3490 Malignant neoplasm of unspecified part of unspecified bronchus or lung: Secondary | ICD-10-CM

## 2020-05-20 DIAGNOSIS — R911 Solitary pulmonary nodule: Secondary | ICD-10-CM

## 2020-05-20 DIAGNOSIS — C3491 Malignant neoplasm of unspecified part of right bronchus or lung: Secondary | ICD-10-CM | POA: Diagnosis not present

## 2020-05-20 DIAGNOSIS — J432 Centrilobular emphysema: Secondary | ICD-10-CM

## 2020-05-20 MED ORDER — ALBUTEROL SULFATE HFA 108 (90 BASE) MCG/ACT IN AERS: 2.0000 | INHALATION_SPRAY | Freq: Four times a day (QID) | RESPIRATORY_TRACT | 6 refills | Status: AC | PRN

## 2020-05-20 MED ORDER — STIOLTO RESPIMAT 2.5-2.5 MCG/ACT IN AERS: 2.0000 | INHALATION_SPRAY | Freq: Every day | RESPIRATORY_TRACT | 2 refills | Status: AC

## 2020-05-20 NOTE — Progress Notes (Signed)
Synopsis: Referred in Oct 2021 for lung nodule PCP by Hulan Fess, MD  Subjective:   PATIENT ID: Kimberly Beck GENDER: female DOB: Jul 28, 1945, MRN: [id number]  Chief Complaint  Patient presents with  . Follow-up    pt stated that she is still having some pain, but this has improved    74 yo FM PMH HTN, DVT, RML GGO, with a semi-solid component concerning for malignancy, former smoker quit in 1987, 34 pack year history.  Patient is able to complete her activities of daily living.  She is not on any breathing treatments for respiratory complaints.  She has not had pulmonary function test in the past.  Here today for follow-up regarding CT scan imaging.  Initial imaging was in October 2020.  She had follow-up images in August 2021 which showed a right middle lobe groundglass nodule with a semisolid component that has more formed organization concerning for potential primary malignancy of the lung.  Patient denies fevers chills night sweats weight loss.  Or hemoptysis.  OV 05/20/2020: Patient seen today for follow-up after recent robotic lobectomy by Dr. Kipp Brood.  Patient was diagnosed with a stage T1 a adenocarcinoma.  Patient did well with surgery.  She still has some breathlessness associated with her exertional activity.  She is currently not on any inhalers.  She feels like she is slowly recovering back to her baseline.  She had follow-up with Dr. Kipp Brood on 05/07/2020.  Office note reviewed.  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  Case was discussed in medical thoracic oncology tumor board and no adjuvant chemotherapy is necessary.  She has a 1 month follow-up scheduled with Dr. Kipp Brood.   Past Medical History:  Diagnosis Date  . Allergy   . Anemia   . Depression   . DVT, lower extremity, recurrent, right ([facility])   . HTN (hypertension)   . Pneumonia      Family History  Problem Relation Age of Onset  . Heart  attack Father   . Stroke Maternal Grandfather      Past Surgical History:  Procedure Laterality Date  . INTERCOSTAL NERVE BLOCK Right 04/29/2020   Procedure: INTERCOSTAL NERVE BLOCK;  Surgeon: Lajuana Matte, MD;  Location: [facility];  Service: Thoracic;  Laterality: Right;  . LYMPH NODE DISSECTION Right 04/29/2020   Procedure: LYMPH NODE DISSECTION;  Surgeon: Lajuana Matte, MD;  Location: [facility];  Service: Thoracic;  Laterality: Right;  . TUBAL LIGATION      Social History   Socioeconomic History  . Marital status: Single    Spouse name: Not on file  . Number of children: Not on file  . Years of education: Not on file  . Highest education level: Not on file  Occupational History  . Not on file  Tobacco Use  . Smoking status: Former Smoker    Packs/day: 3.00    Years: 25.00    Pack years: 75.00    Start date: 1962    Quit date: 1987    Years since quitting: 34.9  . Smokeless tobacco: Never Used  Vaping Use  . Vaping Use: Never used  Substance and Sexual Activity  . Alcohol use: Never  . Drug use: Never  . Sexual activity: Not on file  Other Topics Concern  . Not on file  Social History Narrative  . Not on file   Social Determinants of Health   Financial Resource Strain:   .  Difficulty of Paying Living Expenses: Not on file  Food Insecurity:   . Worried About Charity fundraiser in the Last Year: Not on file  . Ran Out of Food in the Last Year: Not on file  Transportation Needs:   . Lack of Transportation (Medical): Not on file  . Lack of Transportation (Non-Medical): Not on file  Physical Activity:   . Days of Exercise per Week: Not on file  . Minutes of Exercise per Session: Not on file  Stress:   . Feeling of Stress : Not on file  Social Connections:   . Frequency of Communication with Friends and Family: Not on file  . Frequency of Social Gatherings with Friends and Family: Not on file  . Attends Religious Services: Not on file  . Active Member  of Clubs or Organizations: Not on file  . Attends Archivist Meetings: Not on file  . Marital Status: Not on file  Intimate Partner Violence:   . Fear of Current or Ex-Partner: Not on file  . Emotionally Abused: Not on file  . Physically Abused: Not on file  . Sexually Abused: Not on file     Allergies  Allergen Reactions  . Codeine Itching  . Penicillins Rash    Dry rash     Outpatient Medications Prior to Visit  Medication Sig Dispense Refill  . acetaminophen (TYLENOL) 500 MG tablet Take 2 tablets (1,000 mg total) by mouth every 6 (six) hours as needed. 30 tablet 0  . Azelastine HCl 0.15 % SOLN Place 1 spray into both nostrils in the morning and at bedtime.     . Cholecalciferol (VITAMIN D) 50 MCG (2000 UT) CAPS Take 2,000 Units by mouth daily.    Marland Kitchen escitalopram (LEXAPRO) 10 MG tablet Take 10 mg by mouth daily.     . ferrous sulfate 325 (65 FE) MG tablet Take 325 mg by mouth 2 (two) times daily with a meal.    . lisinopril-hydrochlorothiazide (ZESTORETIC) 20-25 MG tablet Take 1 tablet by mouth daily.     . montelukast (SINGULAIR) 10 MG tablet Take 10 mg by mouth daily.    . Multiple Vitamin (MULTIVITAMIN) tablet Take 1 tablet by mouth daily.    Marland Kitchen oxybutynin (DITROPAN-XL) 5 MG 24 hr tablet Take 5 mg by mouth at bedtime.     . potassium chloride (KLOR-CON) 10 MEQ tablet Take 1 tablet (10 mEq total) by mouth daily. 90 tablet 3  . aspirin EC 81 MG tablet Take 81 mg by mouth daily. Swallow whole. (Patient not taking: Reported on 05/20/2020)     No facility-administered medications prior to visit.    Review of Systems  Constitutional: Negative for chills, fever, malaise/fatigue and weight loss.  HENT: Negative for hearing loss, sore throat and tinnitus.   Eyes: Negative for blurred vision and double vision.  Respiratory: Positive for shortness of breath. Negative for cough, hemoptysis, sputum production, wheezing and stridor.   Cardiovascular: Negative for chest pain,  palpitations, orthopnea, leg swelling and PND.  Gastrointestinal: Negative for abdominal pain, constipation, diarrhea, heartburn, nausea and vomiting.  Genitourinary: Negative for dysuria, hematuria and urgency.  Musculoskeletal: Negative for joint pain and myalgias.  Skin: Negative for itching and rash.  Neurological: Negative for dizziness, tingling, weakness and headaches.  Endo/Heme/Allergies: Negative for environmental allergies. Does not bruise/bleed easily.  Psychiatric/Behavioral: Negative for depression. The patient is not nervous/anxious and does not have insomnia.   All other systems reviewed and are negative.  Objective:  Physical Exam Vitals reviewed.  Constitutional:      General: She is not in acute distress.    Appearance: She is well-developed.  HENT:     Head: Normocephalic and atraumatic.     Mouth/Throat:     Pharynx: No oropharyngeal exudate.  Eyes:     Conjunctiva/sclera: Conjunctivae normal.     Pupils: Pupils are equal, round, and reactive to light.  Neck:     Vascular: No JVD.     Trachea: No tracheal deviation.     Comments: Loss of supraclavicular fat Cardiovascular:     Rate and Rhythm: Normal rate and regular rhythm.     Heart sounds: S1 normal and S2 normal.     Comments: Distant heart tones Pulmonary:     Effort: No tachypnea or accessory muscle usage.     Breath sounds: No stridor. Decreased breath sounds (throughout all lung fields) present. No wheezing, rhonchi or rales.  Abdominal:     General: Bowel sounds are normal. There is no distension.     Palpations: Abdomen is soft.     Tenderness: There is no abdominal tenderness.  Musculoskeletal:        General: Deformity (muscle wasting ) present.  Skin:    General: Skin is warm and dry.     Capillary Refill: Capillary refill takes less than 2 seconds.     Findings: No rash.  Neurological:     Mental Status: She is alert and oriented to person, place, and time.  Psychiatric:         Behavior: Behavior normal.      Vitals:   05/20/20 1234  BP: 108/60  Pulse: (!) 107  Temp: 98.1 F (36.7 C)  TempSrc: Tympanic  SpO2: 99%  Weight: 152 lb (68.9 kg)   99% on  BMI Readings from Last 3 Encounters:  05/20/20 25.29 kg/m  05/07/20 25.96 kg/m  04/29/20 25.98 kg/m   Wt Readings from Last 3 Encounters:  05/20/20 152 lb (68.9 kg)  05/07/20 156 lb (70.8 kg)  04/29/20 156 lb 1.6 oz (70.8 kg)     CBC    Component Value Date/Time   WBC 8.0 05/01/2020 0232   RBC 4.01 05/01/2020 0232   HGB 10.5 (L) 05/01/2020 0232   HCT 34.9 (L) 05/01/2020 0232   PLT 215 05/01/2020 0232   MCV 87.0 05/01/2020 0232   MCH 26.2 05/01/2020 0232   MCHC 30.1 05/01/2020 0232   RDW 21.5 (H) 05/01/2020 0232    Chest Imaging: October 2020 CT chest: Right middle lobe groundglass opacity adjacent to the fissure line of the posterior, lower lobe.  CT scan of the chest August 2021: Persistent right middle lobe groundglass opacity and, now more solid in appearance.  Also has curved edge of lobulation. This is concerning for primary bronchogenic carcinoma. The patient's images have been independently reviewed by me.      Pulmonary Functions Testing Results: PFT Results Latest Ref Rng & Units 04/16/2020  FVC-Pre L 2.58  FVC-Predicted Pre % 84  FVC-Post L 2.48  FVC-Predicted Post % 81  Pre FEV1/FVC % % 74  Post FEV1/FCV % % 79  FEV1-Pre L 1.91  FEV1-Predicted Pre % 83  FEV1-Post L 1.96  DLCO uncorrected ml/min/mmHg 14.54  DLCO UNC% % 72  DLCO corrected ml/min/mmHg 14.54  DLCO COR %Predicted % 72  DLVA Predicted % 90    FeNO:   Pathology:   FINAL MICROSCOPIC DIAGNOSIS:   A. LUNG, RIGHT MIDDLE LOBE, LOBECTOMY:  -  Adenocarcinoma, 0.6 cm.  - Margins not involved.  - Tumor involves subpleural connective tissue.  - Peribronchial lymph node with no metastatic carcinoma (0/1).   B. LYMPH NODE, HILAR, EXCISION:  - Anthracotic lymph node with no metastatic carcinoma (0/1).    C. LYMPH NODE, HILAR #2, EXCISION:  - Anthracotic lymph node with no metastatic carcinoma (0/1).   D. LYMPH NODE, HILAR #3, EXCISION:  - Anthracotic lymph node with no metastatic carcinoma (0/1).   E. LYMPH NODE, HILAR #4, EXCISION:  - Anthracotic lymph node with no metastatic carcinoma (0/1).   F. LYMPH NODE, 9, EXCISION:  - Anthracotic lymph node with no metastatic carcinoma (0/1).   G. LYMPH NODE, 7, EXCISION:  - Anthracotic lymph node with no metastatic carcinoma (0/1).   H. LYMPH NODE, 7 #2, EXCISION:  - Anthracotic lymph node with no metastatic carcinoma (0/1).   Echocardiogram:   Heart Catheterization:      Assessment & Plan:     ICD-10-CM   1. Ground glass opacity present on imaging of lung  R91.8   2. Malignant neoplasm of unspecified part of unspecified bronchus or lung ([facility])  C34.90 CT Chest Wo Contrast  3. Nodule of middle lobe of right lung  R91.1   4. Adenocarcinoma, lung, right ([facility])  C34.91   5. Centrilobular emphysema ([facility])  J43.2     Assessment:   This is a 74 year old female with a persistent semisolid groundglass nodule within the right middle lobe adjacent to fissure line that was concerning for a slow-growing primary bronchogenic carcinoma.  Patient was taken for surgical lung biopsy wedge resection confirmed an adenocarcinoma patient had a completion lobectomy by Dr. Kipp Brood robotically.  Patient was discharged from the hospital and doing well now postoperatively.  Pulmonary function test were completed for preoperative evaluation which revealed a FEV1 of 1.96 L, 85% predicted, normal ratio.  Initial imaging also with some mild emphysema.  Plan: We will start bronchodilators to see if her symptoms from her breathlessness improved postoperatively. She does have mild emphysema on her CT imaging however relatively normal PFTs. Patient given samples today of Stiolto. Prescription also sent for Stiolto. Patient was given a as needed albuterol inhaler  to be used for breathlessness and dyspnea to see if this helps with her symptoms. A repeat noncontrasted CT of the chest will be scheduled 1 year from surgery for follow-up to look for any recurrence. We appreciate cardiothoracic surgery help in this case.    Current Outpatient Medications:  .  acetaminophen (TYLENOL) 500 MG tablet, Take 2 tablets (1,000 mg total) by mouth every 6 (six) hours as needed., Disp: 30 tablet, Rfl: 0 .  Azelastine HCl 0.15 % SOLN, Place 1 spray into both nostrils in the morning and at bedtime. , Disp: , Rfl:  .  Cholecalciferol (VITAMIN D) 50 MCG (2000 UT) CAPS, Take 2,000 Units by mouth daily., Disp: , Rfl:  .  escitalopram (LEXAPRO) 10 MG tablet, Take 10 mg by mouth daily. , Disp: , Rfl:  .  ferrous sulfate 325 (65 FE) MG tablet, Take 325 mg by mouth 2 (two) times daily with a meal., Disp: , Rfl:  .  lisinopril-hydrochlorothiazide (ZESTORETIC) 20-25 MG tablet, Take 1 tablet by mouth daily. , Disp: , Rfl:  .  montelukast (SINGULAIR) 10 MG tablet, Take 10 mg by mouth daily., Disp: , Rfl:  .  Multiple Vitamin (MULTIVITAMIN) tablet, Take 1 tablet by mouth daily., Disp: , Rfl:  .  oxybutynin (DITROPAN-XL) 5 MG 24 hr  tablet, Take 5 mg by mouth at bedtime. , Disp: , Rfl:  .  potassium chloride (KLOR-CON) 10 MEQ tablet, Take 1 tablet (10 mEq total) by mouth daily., Disp: 90 tablet, Rfl: 3   Garner Nash, DO [facility] 05/20/2020 12:48 PM

## 2020-05-20 NOTE — Patient Instructions (Addendum)
Thank you for visiting Dr. Valeta Harms at [facility]. Today we recommend the following: Orders Placed This Encounter  Procedures  . CT Chest Wo Contrast   Meds ordered this encounter  Medications  . Tiotropium Bromide-Olodaterol (STIOLTO RESPIMAT) 2.5-2.5 MCG/ACT AERS    Sig: Inhale 2 puffs into the lungs daily.    Dispense:  3 each    Refill:  2  . albuterol (VENTOLIN HFA) 108 (90 Base) MCG/ACT inhaler    Sig: Inhale 2 puffs into the lungs every 6 (six) hours as needed for wheezing or shortness of breath.    Dispense:  8 g    Refill:  6   Return in about 3 months (around 08/20/2020), or if symptoms worsen or fail to improve, for with APP or Dr. Valeta Harms.    Please do your part to reduce the spread of COVID-19.

## 2020-06-03 ENCOUNTER — Other Ambulatory Visit: Payer: Self-pay | Admitting: Thoracic Surgery (Cardiothoracic Vascular Surgery)

## 2020-06-03 DIAGNOSIS — R911 Solitary pulmonary nodule: Secondary | ICD-10-CM

## 2020-06-04 ENCOUNTER — Other Ambulatory Visit: Payer: Self-pay

## 2020-06-04 ENCOUNTER — Ambulatory Visit
Admission: RE | Admit: 2020-06-04 | Discharge: 2020-06-04 | Disposition: A | Discharge status: Home / Self Care | Payer: Medicare Other | Source: Ambulatory Visit | Attending: Thoracic Surgery (Cardiothoracic Vascular Surgery) | Admitting: Thoracic Surgery (Cardiothoracic Vascular Surgery)

## 2020-06-04 ENCOUNTER — Encounter: Payer: Self-pay | Admitting: Thoracic Surgery (Cardiothoracic Vascular Surgery)

## 2020-06-04 ENCOUNTER — Ambulatory Visit (INDEPENDENT_AMBULATORY_CARE_PROVIDER_SITE_OTHER): Payer: Self-pay | Admitting: Thoracic Surgery (Cardiothoracic Vascular Surgery)

## 2020-06-04 VITALS — BP 104/62 | HR 148 | Resp 18 | Ht 65.0 in | Wt 149.8 lb

## 2020-06-04 DIAGNOSIS — R911 Solitary pulmonary nodule: Secondary | ICD-10-CM

## 2020-06-04 DIAGNOSIS — Z902 Acquired absence of lung [part of]: Secondary | ICD-10-CM

## 2020-06-04 NOTE — Progress Notes (Signed)
      [address]       [place], [address]             [phone]        Kimberly Beck [place] Medical Record #[id number] Date of Birth: [birth year]  Referring: Garner Nash, DO Primary Care: Hulan Fess, MD Primary Cardiologist:No primary care provider on file.  Reason for visit:   follow-up  History of Present Illness:     Kimberly Beck comes in for 1 month appointment.  Overall she is doing quite well.  She saw the ENT physician in regards to the lower jaw avidity it was seen on PET scan.  Their notes are unavailable in our system but per her report there is nothing that needs to be done.  She did state that she was tachycardic at that visit as well, and remained so today in our appointment.  She denies any shortness of breath or chest pain.  She occasionally has a cough that is worse mostly in the mornings.  Physical Exam: BP 104/62 (BP Location: Right Arm, Patient Position: Sitting)   Pulse (!) 148   Resp 18   Ht 5\' 5"  (1.651 m)   Wt 149 lb 12.8 oz (67.9 kg)   SpO2 95% Comment: RA with mask on  BMI 24.93 kg/m   Alert NAD Incision clean.  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  She also has a moderate sized hiatal hernia and a chronic cough.  She is tachycardic again in clinic, and at her last appointment she underwent an EKG which showed PACs.  I do think that she will require evaluation by cardiologist given the recurrent nature of her tachycardia.  I will speak to her primary care physician in regards to referrals.  In regards to her lung cancer she will require ongoing surveillance, and is scheduled for a repeat CT chest in 6 months.  In regards to her hiatal hernia she states that she wants to be completely healed from this prior to undergoing  another operation, but would like something done in the beginning of the year.  I will see her back in clinic in January to discuss hiatal hernia repair.   Lajuana Matte 06/04/2020 1:04 PM

## 2020-07-09 ENCOUNTER — Ambulatory Visit: Payer: Self-pay | Admitting: Thoracic Surgery (Cardiothoracic Vascular Surgery)

## 2020-07-09 ENCOUNTER — Encounter: Payer: Self-pay | Admitting: Thoracic Surgery (Cardiothoracic Vascular Surgery)

## 2020-07-09 ENCOUNTER — Encounter: Payer: Self-pay | Admitting: *Deleted

## 2020-07-09 ENCOUNTER — Other Ambulatory Visit: Payer: Self-pay

## 2020-07-09 ENCOUNTER — Other Ambulatory Visit: Payer: Self-pay | Admitting: *Deleted

## 2020-07-09 ENCOUNTER — Telehealth: Payer: Self-pay

## 2020-07-09 VITALS — BP 160/90 | HR 130 | Resp 20 | Ht 65.0 in | Wt 149.0 lb

## 2020-07-09 DIAGNOSIS — K449 Diaphragmatic hernia without obstruction or gangrene: Secondary | ICD-10-CM

## 2020-07-09 NOTE — Progress Notes (Signed)
     [address]       [place] [address]             [phone]       Ms. Rippetoe comes in for regular follow-up appointment.  She underwent right middle lobectomy that was uncomplicated.  Postoperatively she has had this persistent tachycardia which has been evaluated in the A. fib clinic, and she was only noted to have frequent PACs.  In clinic today she is tachycardic to the 130s and asymptomatic.  She has been seen by her primary care physician, who was not yet referred her to her cardiologist.  She also has a pretty significant hiatal hernia and she would like this fixed.  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  I have made a referral to Dr. Gwenlyn Found to evaluate the source of her tachycardia.  If this is clear then I think that it would be safe to proceed with a robotic assisted paraesophageal hernia repair.  I will follow up with her in the next weeks after her appointment with Dr. Gwenlyn Found, and tentatively schedule her for the end of the January for her paraesophageal hernia repair.  Renwick Asman Bary Leriche

## 2020-07-09 NOTE — H&P (View-Only) (Signed)
     [address]       [place] [address]             [phone]       Ms. Crutchfield comes in for regular follow-up appointment.  She underwent right middle lobectomy that was uncomplicated.  Postoperatively she has had this persistent tachycardia which has been evaluated in the A. fib clinic, and she was only noted to have frequent PACs.  In clinic today she is tachycardic to the 130s and asymptomatic.  She has been seen by her primary care physician, who was not yet referred her to her cardiologist.  She also has a pretty significant hiatal hernia and she would like this fixed.  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  I have made a referral to Dr. Gwenlyn Found to evaluate the source of her tachycardia.  If this is clear then I think that it would be safe to proceed with a robotic assisted paraesophageal hernia repair.  I will follow up with her in the next weeks after her appointment with Dr. Gwenlyn Found, and tentatively schedule her for the end of the January for her paraesophageal hernia repair.  Sydny Schnitzler Bary Leriche

## 2020-07-09 NOTE — Telephone Encounter (Signed)
Spoke with pt. Pt able to make appointment for Wed. 1/12 at 11:45am.

## 2020-07-09 NOTE — Telephone Encounter (Signed)
-----   Message from Lorretta Harp, MD sent at 07/09/2020  5:02 PM EST ----- Happy to [place]. Rexanne Mano, can you fit this lady into my schedule sometime next week??  Thx, JJB ----- Message ----- From: Lajuana Matte, MD Sent: 07/09/2020   4:50 PM EST To: Lorretta Harp, MD  Dr. Gwenlyn Found, Would you mind taking a look at Ms. Gerilyn Nestle?  I did a right middle lobectomy on her back in October, and since then she has had this persistent tachycardia.  She has had a couple of EKGs that only showed PACs, so I do not really have a good source for this.  I am also working her up for a paraesophageal hernia repair but wanted to make sure that she was cleared to proceed with that.  Thanks,  H.

## 2020-07-14 ENCOUNTER — Encounter: Payer: Self-pay | Admitting: Cardiovascular Disease

## 2020-07-14 ENCOUNTER — Other Ambulatory Visit: Payer: Self-pay

## 2020-07-14 ENCOUNTER — Ambulatory Visit: Payer: Medicare Other | Admitting: Cardiovascular Disease

## 2020-07-14 DIAGNOSIS — I10 Essential (primary) hypertension: Secondary | ICD-10-CM | POA: Diagnosis not present

## 2020-07-14 DIAGNOSIS — R000 Tachycardia, unspecified: Secondary | ICD-10-CM | POA: Diagnosis not present

## 2020-07-14 MED ORDER — METOPROLOL SUCCINATE ER 25 MG PO TB24: 25.0000 mg | ORAL_TABLET | Freq: Every day | ORAL | 3 refills | Status: DC

## 2020-07-14 NOTE — Patient Instructions (Signed)
Medication Instructions:  Start taking Toprolol- XL 25mg  once daily.  *If you need a refill on your cardiac medications before your next appointment, please call your pharmacy*  Testing/Procedures: Your physician has requested that you have an echocardiogram. Echocardiography is a painless test that uses sound waves to create images of your heart. It provides your doctor with information about the size and shape of your heart and how well your heart's chambers and valves are working. This procedure takes approximately one hour. There are no restrictions for this procedure. [place]. [employer]. 3rd Floor.   Follow-Up: At [facility], you and your health needs are our priority.  As part of our continuing mission to provide you with exceptional heart care, we have created designated Provider Care Teams.  These Care Teams include your primary Cardiologist (physician) and Advanced Practice Providers (APPs -  Physician Assistants and Nurse Practitioners) who all work together to provide you with the care you need, when you need it.  We recommend signing up for the patient portal called "MyChart".  Sign up information is provided on this After Visit Summary.  MyChart is used to connect with patients for Virtual Visits (Telemedicine).  Patients are able to view lab/test results, encounter notes, upcoming appointments, etc.  Non-urgent messages can be sent to your provider as well.   To learn more about what you can do with MyChart, go to [email or web address].    Your next appointment:   3 month(s)  The format for your next appointment:   In Person  Provider:   Quay Burow, MD   Other Instructions Cleared for surgery with Dr. Kipp Brood.

## 2020-07-14 NOTE — Assessment & Plan Note (Signed)
Resting heart rate in the low 100 range.  TSH is normal.  She is a symptomatic and unaware of this.  I am going to obtain a 2D echo for LV function and begin her on a low-dose beta-blocker.

## 2020-07-14 NOTE — Progress Notes (Signed)
07/14/2020 Kimberly Beck   04-28-1946  [id number]  Primary Physician Hulan Fess, MD Primary Cardiologist: Lorretta Harp MD FACP, [place], [place], [place]  HPI:  Kimberly Beck is a 75 y.o. mildly overweight separated Caucasian female with no children referred by Dr. Kipp Brood for preoperative clearance before robotic paraesophageal hernia repair of a large hiatal hernia.  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  She smoked remotely and stopped in 1987.  She has treated hypertension.  Her father did die of a myocardial infarction at age 42.  She is never had a heart or stroke.  She denies chest pain or shortness of breath.  She had resting tachycardia for an unknown known period of time although she is unaware of this.  TSH is normal.  This was reviewed by Roderic Palau, FNP for concern that it was A. fib but it was sinus tach with PACs.   Current Meds  Medication Sig  . acetaminophen (TYLENOL) 500 MG tablet Take 2 tablets (1,000 mg total) by mouth every 6 (six) hours as needed.  Marland Kitchen albuterol (VENTOLIN HFA) 108 (90 Base) MCG/ACT inhaler Inhale 2 puffs into the lungs every 6 (six) hours as needed for wheezing or shortness of breath.  Marland Kitchen aspirin EC 81 MG tablet Take 81 mg by mouth daily. Swallow whole.  . Azelastine HCl 0.15 % SOLN Place 1 spray into both nostrils in the morning and at bedtime.   . Cholecalciferol (VITAMIN D) 50 MCG (2000 UT) CAPS Take 2,000 Units by mouth daily.  Marland Kitchen escitalopram (LEXAPRO) 10 MG tablet Take 10 mg by mouth daily.   . ferrous sulfate 325 (65 FE) MG tablet Take 325 mg by mouth 2 (two) times daily with a meal.  . lisinopril-hydrochlorothiazide (ZESTORETIC) 20-25 MG tablet Take 1 tablet by mouth daily.   . montelukast (SINGULAIR) 10 MG tablet Take 10 mg by mouth daily.  . Multiple Vitamin (MULTIVITAMIN) tablet Take 1 tablet by mouth daily.  Marland Kitchen oxybutynin (DITROPAN-XL) 5 MG 24 hr tablet Take 5 mg by mouth at bedtime.   . potassium  chloride (KLOR-CON) 10 MEQ tablet Take 1 tablet (10 mEq total) by mouth daily.  . Tiotropium Bromide-Olodaterol (STIOLTO RESPIMAT) 2.5-2.5 MCG/ACT AERS Inhale 2 puffs into the lungs daily.     Allergies  Allergen Reactions  . Codeine Itching  . Fluticasone Propionate     Other reaction(s): dizziness  . Nsaids Other (See Comments)  . Other     Other reaction(s): diarrhea and headache Other reaction(s): Global anesthesia Other reaction(s): edema  . Penicillins Rash    Dry rash    Social History   Socioeconomic History  . Marital status: Single    Spouse name: Not on file  . Number of children: Not on file  . Years of education: Not on file  . Highest education level: Not on file  Occupational History  . Not on file  Tobacco Use  . Smoking status: Former Smoker    Packs/day: 3.00    Years: 25.00    Pack years: 75.00    Start date: 1962    Quit date: 1987    Years since quitting: 35.0  . Smokeless tobacco: Never Used  Vaping Use  . Vaping Use: Never used  Substance and Sexual Activity  . Alcohol use: Never  . Drug use: Never  . Sexual activity: Not on file  Other Topics Concern  . Not on file  Social History Narrative  .  Not on file   Social Determinants of Health   Financial Resource Strain: Not on file  Food Insecurity: Not on file  Transportation Needs: Not on file  Physical Activity: Not on file  Stress: Not on file  Social Connections: Not on file  Intimate Partner Violence: Not on file     Review of Systems: General: negative for chills, fever, night sweats or weight changes.  Cardiovascular: negative for chest pain, dyspnea on exertion, edema, orthopnea, palpitations, paroxysmal nocturnal dyspnea or shortness of breath Dermatological: negative for rash Respiratory: negative for cough or wheezing Urologic: negative for hematuria Abdominal: negative for nausea, vomiting, diarrhea, bright red blood per rectum, melena, or hematemesis Neurologic:  negative for visual changes, syncope, or dizziness All other systems reviewed and are otherwise negative except as noted above.    Blood pressure 116/78, pulse (!) 113, height 5\' 5"  (1.651 m), weight 148 lb (67.1 kg), SpO2 97 %.  General appearance: alert and no distress Neck: no adenopathy, no carotid bruit, no JVD, supple, symmetrical, trachea midline and thyroid not enlarged, symmetric, no tenderness/mass/nodules Lungs: clear to auscultation bilaterally Heart: regular rate and rhythm, S1, S2 normal, no murmur, click, rub or gallop Extremities: extremities normal, atraumatic, no cyanosis or edema Pulses: 2+ and symmetric Skin: Skin color, texture, turgor normal. No rashes or lesions Neurologic: Alert and oriented X 3, normal strength and tone. Normal symmetric reflexes. Normal coordination and gait  EKG sinus tachycardia 113 without ST or T wave changes.  Personally reviewed this EKG.  ASSESSMENT AND PLAN:   Essential hypertension History of essential hypertension a blood pressure measured today at 116/78.  She is on Zestoretic 20/25.  Sinus tachycardia Resting heart rate in the low 100 range.  TSH is normal.  She is a symptomatic and unaware of this.  I am going to obtain a 2D echo for LV function and begin her on a low-dose beta-blocker.      Lorretta Harp MD FACP,FACC,FAHA, [facility] 07/14/2020 12:37 PM

## 2020-07-14 NOTE — Assessment & Plan Note (Signed)
History of essential hypertension a blood pressure measured today at 116/78.  She is on Zestoretic 20/25.

## 2020-07-20 ENCOUNTER — Other Ambulatory Visit (HOSPITAL_COMMUNITY): Payer: Medicare Other

## 2020-07-21 ENCOUNTER — Ambulatory Visit (HOSPITAL_COMMUNITY): Payer: Medicare Other | Attending: Cardiovascular Disease

## 2020-07-21 ENCOUNTER — Other Ambulatory Visit: Payer: Self-pay

## 2020-07-21 DIAGNOSIS — R000 Tachycardia, unspecified: Secondary | ICD-10-CM

## 2020-07-21 DIAGNOSIS — I10 Essential (primary) hypertension: Secondary | ICD-10-CM | POA: Diagnosis not present

## 2020-07-21 LAB — ECHOCARDIOGRAM COMPLETE
Area-P 1/2: 5.62 cm2
Calc EF: 52.2 %
S' Lateral: 2.2 cm
Single Plane A2C EF: 53.4 %
Single Plane A4C EF: 51.9 %

## 2020-07-27 ENCOUNTER — Encounter (HOSPITAL_COMMUNITY): Payer: Self-pay

## 2020-07-27 ENCOUNTER — Other Ambulatory Visit (HOSPITAL_COMMUNITY)
Admission: RE | Admit: 2020-07-27 | Discharge: 2020-07-27 | Disposition: A | Discharge status: Home / Self Care | Payer: Medicare Other | Source: Ambulatory Visit | Attending: Thoracic Surgery (Cardiothoracic Vascular Surgery) | Admitting: Thoracic Surgery (Cardiothoracic Vascular Surgery)

## 2020-07-27 ENCOUNTER — Ambulatory Visit (HOSPITAL_COMMUNITY)
Admission: RE | Admit: 2020-07-27 | Discharge: 2020-07-27 | Disposition: A | Discharge status: Home / Self Care | Payer: Medicare Other | Source: Ambulatory Visit | Attending: Thoracic Surgery (Cardiothoracic Vascular Surgery) | Admitting: Thoracic Surgery (Cardiothoracic Vascular Surgery)

## 2020-07-27 ENCOUNTER — Encounter (HOSPITAL_COMMUNITY)
Admission: RE | Admit: 2020-07-27 | Discharge: 2020-07-27 | Disposition: A | Discharge status: Home / Self Care | Payer: Medicare Other | Source: Ambulatory Visit | Attending: Thoracic Surgery (Cardiothoracic Vascular Surgery) | Admitting: Thoracic Surgery (Cardiothoracic Vascular Surgery)

## 2020-07-27 ENCOUNTER — Other Ambulatory Visit: Payer: Self-pay

## 2020-07-27 DIAGNOSIS — Z01818 Encounter for other preprocedural examination: Secondary | ICD-10-CM

## 2020-07-27 DIAGNOSIS — Z20822 Contact with and (suspected) exposure to covid-19: Secondary | ICD-10-CM | POA: Diagnosis not present

## 2020-07-27 DIAGNOSIS — Z902 Acquired absence of lung [part of]: Secondary | ICD-10-CM | POA: Diagnosis not present

## 2020-07-27 DIAGNOSIS — F32A Depression, unspecified: Secondary | ICD-10-CM | POA: Diagnosis not present

## 2020-07-27 DIAGNOSIS — Z9889 Other specified postprocedural states: Secondary | ICD-10-CM | POA: Diagnosis not present

## 2020-07-27 DIAGNOSIS — I10 Essential (primary) hypertension: Secondary | ICD-10-CM | POA: Diagnosis not present

## 2020-07-27 DIAGNOSIS — J9811 Atelectasis: Secondary | ICD-10-CM | POA: Diagnosis not present

## 2020-07-27 DIAGNOSIS — K7689 Other specified diseases of liver: Secondary | ICD-10-CM | POA: Diagnosis not present

## 2020-07-27 DIAGNOSIS — Z87891 Personal history of nicotine dependence: Secondary | ICD-10-CM | POA: Diagnosis not present

## 2020-07-27 DIAGNOSIS — Z01812 Encounter for preprocedural laboratory examination: Secondary | ICD-10-CM | POA: Insufficient documentation

## 2020-07-27 DIAGNOSIS — J449 Chronic obstructive pulmonary disease, unspecified: Secondary | ICD-10-CM | POA: Diagnosis not present

## 2020-07-27 DIAGNOSIS — Z85118 Personal history of other malignant neoplasm of bronchus and lung: Secondary | ICD-10-CM | POA: Diagnosis not present

## 2020-07-27 DIAGNOSIS — K449 Diaphragmatic hernia without obstruction or gangrene: Secondary | ICD-10-CM

## 2020-07-27 HISTORY — DX: Other specified postprocedural states: Z98.890

## 2020-07-27 HISTORY — DX: Malignant neoplasm of unspecified part of unspecified bronchus or lung: C34.90

## 2020-07-27 HISTORY — DX: Malignant (primary) neoplasm, unspecified: C80.1

## 2020-07-27 HISTORY — DX: Personal history of other diseases of the digestive system: Z87.19

## 2020-07-27 HISTORY — DX: Nausea with vomiting, unspecified: R11.2

## 2020-07-27 HISTORY — DX: Prediabetes: R73.03

## 2020-07-27 HISTORY — DX: Other complications of anesthesia, initial encounter: T88.59XA

## 2020-07-27 LAB — CBC
HCT: 42 % (ref 36.0–46.0)
Hemoglobin: 13.1 g/dL (ref 12.0–15.0)
MCH: 28.2 pg (ref 26.0–34.0)
MCHC: 31.2 g/dL (ref 30.0–36.0)
MCV: 90.3 fL (ref 80.0–100.0)
Platelets: 241 10*3/uL (ref 150–400)
RBC: 4.65 MIL/uL (ref 3.87–5.11)
RDW: 15.5 % (ref 11.5–15.5)
WBC: 9.4 10*3/uL (ref 4.0–10.5)
nRBC: 0 % (ref 0.0–0.2)

## 2020-07-27 LAB — BLOOD GAS, ARTERIAL
Acid-Base Excess: 0 mmol/L (ref 0.0–2.0)
Bicarbonate: 23 mmol/L (ref 20.0–28.0)
Drawn by: 602861
FIO2: 21
O2 Saturation: 98.3 %
Patient temperature: 37
pCO2 arterial: 30.1 mmHg — ABNORMAL LOW (ref 32.0–48.0)
pH, Arterial: 7.495 — ABNORMAL HIGH (ref 7.350–7.450)
pO2, Arterial: 101 mmHg (ref 83.0–108.0)

## 2020-07-27 LAB — URINALYSIS, ROUTINE W REFLEX MICROSCOPIC
Bilirubin Urine: NEGATIVE
Glucose, UA: NEGATIVE mg/dL
Ketones, ur: NEGATIVE mg/dL
Leukocytes,Ua: NEGATIVE
Nitrite: NEGATIVE
Protein, ur: NEGATIVE mg/dL
Specific Gravity, Urine: 1.02 (ref 1.005–1.030)
pH: 5 (ref 5.0–8.0)

## 2020-07-27 LAB — COMPREHENSIVE METABOLIC PANEL
ALT: 11 U/L (ref 0–44)
AST: 18 U/L (ref 15–41)
Albumin: 3.8 g/dL (ref 3.5–5.0)
Alkaline Phosphatase: 60 U/L (ref 38–126)
Anion gap: 13 (ref 5–15)
BUN: 20 mg/dL (ref 8–23)
CO2: 21 mmol/L — ABNORMAL LOW (ref 22–32)
Calcium: 9 mg/dL (ref 8.9–10.3)
Chloride: 105 mmol/L (ref 98–111)
Creatinine, Ser: 1.16 mg/dL — ABNORMAL HIGH (ref 0.44–1.00)
GFR, Estimated: 50 mL/min — ABNORMAL LOW (ref >60)
Glucose, Bld: 105 mg/dL — ABNORMAL HIGH (ref 70–99)
Potassium: 3.5 mmol/L (ref 3.5–5.1)
Sodium: 139 mmol/L (ref 135–145)
Total Bilirubin: 0.8 mg/dL (ref 0.3–1.2)
Total Protein: 6.7 g/dL (ref 6.5–8.1)

## 2020-07-27 LAB — TYPE AND SCREEN
ABO/RH(D): O POS
Antibody Screen: NEGATIVE

## 2020-07-27 LAB — SURGICAL PCR SCREEN
MRSA, PCR: NEGATIVE
Staphylococcus aureus: NEGATIVE

## 2020-07-27 LAB — SARS CORONAVIRUS 2 (TAT 6-24 HRS): SARS Coronavirus 2: NEGATIVE

## 2020-07-27 LAB — PROTIME-INR
INR: 0.9 (ref 0.8–1.2)
Prothrombin Time: 12.2 seconds (ref 11.4–15.2)

## 2020-07-27 LAB — GLUCOSE, CAPILLARY: Glucose-Capillary: 102 mg/dL — ABNORMAL HIGH (ref 70–99)

## 2020-07-27 LAB — HEMOGLOBIN A1C
Hgb A1c MFr Bld: 5.6 % (ref 4.8–5.6)
Mean Plasma Glucose: 114.02 mg/dL

## 2020-07-27 LAB — APTT: aPTT: 26 seconds (ref 24–36)

## 2020-07-27 NOTE — Progress Notes (Addendum)
PCP - Dr. Lawernce Pitts  Cardiologist - Dr. Adora Fridge C.C. 07/14/20  Chest x-ray - 07/27/20  EKG - 07/14/20 (E)  Stress Test - Denies  ECHO - 07/21/20 (E)  Cardiac Cath - Denies  AICD-na PM-na LOOP-na  Sleep Study - Denies CPAP - Denies  LABS- 07/27/20: CBC, CMP, PT, PTT, ABG, T/S, PCR, UA, COVID  ASA- LD- 1/25  ERAS- No  HA1C- 07/27/20 Fasting Blood Sugar - 115-125, today 102 Checks Blood Sugar ___1__ times a day- Pt does bs checks, but she is not on any medication at this time.  Anesthesia- Yes- C.C.  Pt denies having chest pain, sob, or fever at this time. All instructions explained to the pt, with a verbal understanding of the material. Pt agrees to go over the instructions while at home for a better understanding. Pt also instructed to self quarantine after being tested for COVID-19. The opportunity to ask questions was provided.   Coronavirus Screening  Have you experienced the following symptoms:  Cough yes/no: No Fever (>100.3F)  yes/no: No Runny nose yes/no: No Sore throat yes/no: No Difficulty breathing/shortness of breath  yes/no: No  Have you or a family member traveled in the last 14 days and where? yes/no: No   If the patient indicates "YES" to the above questions, their PAT will be rescheduled to limit the exposure to others and, the surgeon will be notified. THE PATIENT WILL NEED TO BE ASYMPTOMATIC FOR 14 DAYS.   If the patient is not experiencing any of these symptoms, the PAT nurse will instruct them to NOT bring anyone with them to their appointment since they may have these symptoms or traveled as well.   Please remind your patients and families that hospital visitation restrictions are in effect and the importance of the restrictions.

## 2020-07-27 NOTE — Pre-Procedure Instructions (Signed)
Kimberly Beck  07/27/2020     Your procedure is scheduled on Thurs., Jan. 27, 2022 from 7:30AM-10:30AM  Report to [facility] Entrance "A" at 5:30AM  Call this number if you have problems the morning of surgery:  [phone]   Remember:  Do not eat or drink after midnight on Jan. 26th    Take these medicines the morning of surgery with A SIP OF WATER: Escitalopram (LEXAPRO) Metoprolol succinate (TOPROL-XL) Montelukast (SINGULAIR)  If Needed: Acetaminophen (TYLENOL)  As of today, STOP taking all Aspirin (unless instructed by your doctor) and Other Aspirin containing products, Vitamins, Fish oils, and Herbal medications. Also stop all NSAIDS i.e. Advil, Ibuprofen, Motrin, Aleve, Anaprox, Naproxen, BC, Goody Powders, and all Supplements.   No Smoking of any kind, Tobacco/Vaping, or Alcohol products 24 hours prior to your procedure. If you use a Cpap at night, you may bring all equipment for your overnight stay.    Day of Surgery:  Do not wear jewelry, make-up or nail polish.  Do not wear lotions, powders, or perfumes, or deodorant.  Do not shave 48 hours prior to surgery.    Do not bring valuables to the hospital.  [facility] is not responsible for any belongings or valuables.  Contacts, dentures or bridgework may not be worn into surgery.    Special instructions:   - Preparing For Surgery  Before surgery, you can play an important role. Because skin is not sterile, your skin needs to be as free of germs as possible. You can reduce the number of germs on your skin by washing with CHG (chlorahexidine gluconate) Soap before surgery.  CHG is an antiseptic cleaner which kills germs and bonds with the skin to continue killing germs even after washing.    Oral Hygiene is also important to reduce your risk of infection.  Remember - BRUSH YOUR TEETH THE MORNING OF SURGERY WITH YOUR REGULAR TOOTHPASTE  Please do not use if you have an allergy to CHG or  antibacterial soaps. If your skin becomes reddened/irritated stop using the CHG.  Do not shave (including legs and underarms) for at least 48 hours prior to first CHG shower. It is OK to shave your face.  Please follow these instructions carefully.   1. Shower the NIGHT BEFORE SURGERY and the MORNING OF SURGERY with CHG.   2. If you chose to wash your hair, wash your hair first as usual with your normal shampoo.  3. After you shampoo, rinse your hair and body thoroughly to remove the shampoo.  4. Use CHG as you would any other liquid soap. You can apply CHG directly to the skin and wash gently with a scrungie or a clean washcloth.   5. Apply the CHG Soap to your body ONLY FROM THE NECK DOWN.  Do not use on open wounds or open sores. Avoid contact with your eyes, ears, mouth and genitals (private parts). Wash Face and genitals (private parts)  with your normal soap.  6. Wash thoroughly, paying special attention to the area where your surgery will be performed.  7. Thoroughly rinse your body with warm water from the neck down.  8. DO NOT shower/wash with your normal soap after using and rinsing off the CHG Soap.  9. Pat yourself dry with a CLEAN TOWEL.  10. Wear CLEAN PAJAMAS to bed the night before surgery, wear comfortable clothes the morning of surgery  11. Place CLEAN SHEETS on your bed the night of your first  shower and DO NOT SLEEP WITH PETS.  Reminders: Do not apply any deodorants/lotions.  Please wear clean clothes to the hospital/surgery center.   Remember to brush your teeth WITH YOUR REGULAR TOOTHPASTE.  For patients admitted to the hospital, discharge time will be determined by your treatment team.  Patients discharged the day of surgery will not be allowed to drive home, and someone age 74 and over needs to stay with them for 24 hours.  Please read over the following fact sheets that you were given.

## 2020-07-28 NOTE — Anesthesia Preprocedure Evaluation (Addendum)
Anesthesia Evaluation  Patient identified by MRN, date of birth, ID band Patient awake    Reviewed: Allergy & Precautions, NPO status , Patient's Chart, lab work & pertinent test results, reviewed documented beta blocker date and time   History of Anesthesia Complications (+) PONV  Airway Mallampati: II  TM Distance: >3 FB Neck ROM: Full   Comment: Small mouth Dental  (+) Dental Advisory Given   Pulmonary COPD,  COPD inhaler, former smoker,  H/o lung cancer 07/27/2020 SARS coronavirus NEG   breath sounds clear to auscultation       Cardiovascular hypertension, Pt. on medications and Pt. on home beta blockers (-) angina Rhythm:Regular Rate:Normal  07/21/2020 ECHO: EF 50-55%, abnormal septal motion with inferior hypokinesis, trivial MR with mitral annular calcification   Neuro/Psych Depression negative neurological ROS     GI/Hepatic Neg liver ROS, hiatal hernia,   Endo/Other  negative endocrine ROS  Renal/GU negative Renal ROS     Musculoskeletal   Abdominal   Peds  Hematology negative hematology ROS (+)   Anesthesia Other Findings   Reproductive/Obstetrics                           Anesthesia Physical Anesthesia Plan  ASA: III  Anesthesia Plan: General   Post-op Pain Management:    Induction: Intravenous  PONV Risk Score and Plan: 4 or greater and Ondansetron, Dexamethasone, Treatment may vary due to age or medical condition and Diphenhydramine  Airway Management Planned: Oral ETT and Video Laryngoscope Planned  Additional Equipment: Arterial line  Intra-op Plan:   Post-operative Plan: Extubation in OR  Informed Consent: I have reviewed the patients History and Physical, chart, labs and discussed the procedure including the risks, benefits and alternatives for the proposed anesthesia with the patient or authorized representative who has indicated his/her understanding and  acceptance.     Dental advisory given  Plan Discussed with: CRNA and Surgeon  Anesthesia Plan Comments: (PAT note by Karoline Caldwell, PA-C: Patient was referred to cardiologist Dr. Gwenlyn Found by Dr. Kipp Brood for preoperative clearance before robotic paraesophageal hernia repair of large hiatal hernia.  Due to history of resting sinus tachycardia, 2D echo was ordered to assess LV function and she was started on a low-dose beta-blocker.  Dr. Gwenlyn Found commented on echo result stating, "Normal LV systolic function with grade 1 diastolic dysfunction." Clearance under letters tab dated 07/14/20 states, "[name] has surgical clearance from a cardiac standpoint per Dr. 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  She did well postoperatively.  Preop labs reviewed, unremarkable.  EKG 07/14/2020: Sinus tachycardia.  Rate 113.  Possible left atrial enlargement.  CHEST - 2 VIEW 07/27/2020: COMPARISON:  06/04/2020  FINDINGS: Hyperinflation. Heart size and pulmonary vascularity are normal. Lungs are clear and expanded. Interval resolution of previous right lung infiltrates. Mediastinal contours appear intact. Large esophageal hiatal hernia behind the heart.  IMPRESSION: Large esophageal hiatal hernia behind the heart. No evidence of active pulmonary disease.  TTE 07/21/2020: 1. Abnormal septal motion inferior hypokinesis . Left ventricular  ejection fraction, by estimation, is 50 to 55%. The left ventricle has low  normal function. The left ventricle demonstrates regional wall motion  abnormalities (see scoring diagram/findings for description). Left ventricular diastolic parameters  are consistent with Grade I diastolic dysfunction (impaired relaxation).  2. Right ventricular systolic function is normal. The right ventricular  size is normal.  3. The mitral valve is abnormal. Trivial mitral  valve regurgitation. No  evidence of mitral stenosis. Moderate mitral annular calcification.  4. The aortic valve is normal in structure. Aortic valve regurgitation is  not visualized. No aortic stenosis is present.  5. The inferior vena cava is normal in size with greater than 50%  respiratory variability, suggesting right atrial pressure of 3 mmHg.   PFT 04/16/2020: FVC-%Pred-Pre Latest Units: % 84 FEV1-%Pred-Pre Latest Units: % 83 FEV1FVC-%Pred-Pre Latest Units: % 98 DLCO unc % pred Latest Units: % 72  )      Anesthesia Quick Evaluation

## 2020-07-28 NOTE — Progress Notes (Signed)
Anesthesia Chart Review:  Patient was referred to cardiologist Dr. Gwenlyn Found by Dr. Kipp Brood for preoperative clearance before robotic paraesophageal hernia repair of large hiatal hernia.  Due to history of resting sinus tachycardia, 2D echo was ordered to assess LV function and she was started on a low-dose beta-blocker.  Dr. Gwenlyn Found commented on echo result stating, "Normal LV systolic function with grade 1 diastolic dysfunction." Clearance under letters tab dated 07/14/20 states, "[name] has surgical clearance from a cardiac standpoint per Dr. 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  She did well postoperatively.  Preop labs reviewed, unremarkable.  EKG 07/14/2020: Sinus tachycardia.  Rate 113.  Possible left atrial enlargement.  CHEST - 2 VIEW 07/27/2020: COMPARISON:  06/04/2020  FINDINGS: Hyperinflation. Heart size and pulmonary vascularity are normal. Lungs are clear and expanded. Interval resolution of previous right lung infiltrates. Mediastinal contours appear intact. Large esophageal hiatal hernia behind the heart.  IMPRESSION: Large esophageal hiatal hernia behind the heart. No evidence of active pulmonary disease.  TTE 07/21/2020: 1. Abnormal septal motion inferior hypokinesis . Left ventricular  ejection fraction, by estimation, is 50 to 55%. The left ventricle has low  normal function. The left ventricle demonstrates regional wall motion  abnormalities (see scoring diagram/findings for description). Left ventricular diastolic parameters  are consistent with Grade I diastolic dysfunction (impaired relaxation).  2. Right ventricular systolic function is normal. The right ventricular  size is normal.  3. The mitral valve is abnormal. Trivial mitral valve regurgitation. No  evidence of mitral stenosis. Moderate mitral annular calcification.  4. The aortic valve  is normal in structure. Aortic valve regurgitation is  not visualized. No aortic stenosis is present.  5. The inferior vena cava is normal in size with greater than 50%  respiratory variability, suggesting right atrial pressure of 3 mmHg.   PFT 04/16/2020: FVC-%Pred-Pre Latest Units: % 84  FEV1-%Pred-Pre Latest Units: % 83  FEV1FVC-%Pred-Pre Latest Units: % 98  DLCO unc % pred Latest Units: % [place], PA-C [facility]/Anesthesiology Phone [phone] 07/28/2020 10:01 AM

## 2020-07-29 ENCOUNTER — Inpatient Hospital Stay (HOSPITAL_COMMUNITY): Payer: Medicare Other | Admitting: Physician Assistant

## 2020-07-29 ENCOUNTER — Inpatient Hospital Stay (HOSPITAL_COMMUNITY): Payer: Medicare Other | Admitting: Certified Registered"

## 2020-07-29 ENCOUNTER — Encounter (HOSPITAL_COMMUNITY): Payer: Self-pay | Admitting: Thoracic Surgery (Cardiothoracic Vascular Surgery)

## 2020-07-29 ENCOUNTER — Inpatient Hospital Stay (HOSPITAL_COMMUNITY)
Admission: RE | Admit: 2020-07-29 | Discharge: 2020-07-30 | DRG: 328 | Disposition: A | Discharge status: Home / Self Care | Payer: Medicare Other | Attending: Thoracic Surgery (Cardiothoracic Vascular Surgery) | Admitting: Thoracic Surgery (Cardiothoracic Vascular Surgery)

## 2020-07-29 ENCOUNTER — Other Ambulatory Visit: Payer: Self-pay

## 2020-07-29 ENCOUNTER — Inpatient Hospital Stay (HOSPITAL_COMMUNITY): Payer: Medicare Other

## 2020-07-29 ENCOUNTER — Encounter (HOSPITAL_COMMUNITY)
Admission: RE | Disposition: A | Discharge status: Home / Self Care | Payer: Self-pay | Source: Home / Self Care | Attending: Thoracic Surgery (Cardiothoracic Vascular Surgery)

## 2020-07-29 DIAGNOSIS — K7689 Other specified diseases of liver: Secondary | ICD-10-CM | POA: Diagnosis present

## 2020-07-29 DIAGNOSIS — Z20822 Contact with and (suspected) exposure to covid-19: Secondary | ICD-10-CM | POA: Diagnosis not present

## 2020-07-29 DIAGNOSIS — Z85118 Personal history of other malignant neoplasm of bronchus and lung: Secondary | ICD-10-CM | POA: Diagnosis not present

## 2020-07-29 DIAGNOSIS — K449 Diaphragmatic hernia without obstruction or gangrene: Principal | ICD-10-CM

## 2020-07-29 DIAGNOSIS — I10 Essential (primary) hypertension: Secondary | ICD-10-CM | POA: Diagnosis not present

## 2020-07-29 DIAGNOSIS — K9189 Other postprocedural complications and disorders of digestive system: Secondary | ICD-10-CM

## 2020-07-29 DIAGNOSIS — Z87891 Personal history of nicotine dependence: Secondary | ICD-10-CM

## 2020-07-29 DIAGNOSIS — J449 Chronic obstructive pulmonary disease, unspecified: Secondary | ICD-10-CM | POA: Diagnosis present

## 2020-07-29 DIAGNOSIS — Z902 Acquired absence of lung [part of]: Secondary | ICD-10-CM

## 2020-07-29 HISTORY — PX: ESOPHAGOGASTRODUODENOSCOPY: SHX5428

## 2020-07-29 HISTORY — PX: XI ROBOTIC ASSISTED PARAESOPHAGEAL HERNIA REPAIR: SHX6871

## 2020-07-29 LAB — GLUCOSE, CAPILLARY
Glucose-Capillary: 110 mg/dL — ABNORMAL HIGH (ref 70–99)
Glucose-Capillary: 123 mg/dL — ABNORMAL HIGH (ref 70–99)
Glucose-Capillary: 157 mg/dL — ABNORMAL HIGH (ref 70–99)

## 2020-07-29 SURGERY — REPAIR, HERNIA, PARAESOPHAGEAL, ROBOT-ASSISTED
Anesthesia: General | Site: Chest

## 2020-07-29 MED ORDER — OXYCODONE HCL 5 MG PO TABS: 5.0000 mg | ORAL_TABLET | Freq: Once | ORAL | Status: DC | PRN

## 2020-07-29 MED ORDER — DIPHENHYDRAMINE HCL 50 MG/ML IJ SOLN
INTRAMUSCULAR | Status: DC | PRN
  Administered 2020-07-29: 12.5 mg via INTRAVENOUS

## 2020-07-29 MED ORDER — LIDOCAINE 2% (20 MG/ML) 5 ML SYRINGE
INTRAMUSCULAR | Status: DC | PRN
  Administered 2020-07-29: 20 mg via INTRAVENOUS

## 2020-07-29 MED ORDER — DEXAMETHASONE SODIUM PHOSPHATE 10 MG/ML IJ SOLN
INTRAMUSCULAR | Status: DC | PRN
  Administered 2020-07-29: 5 mg via INTRAVENOUS

## 2020-07-29 MED ORDER — ORAL CARE MOUTH RINSE: 15.0000 mL | Freq: Once | OROMUCOSAL | Status: AC

## 2020-07-29 MED ORDER — PHENYLEPHRINE HCL-NACL 10-0.9 MG/250ML-% IV SOLN
INTRAVENOUS | Status: DC | PRN
  Administered 2020-07-29: 30 ug/min via INTRAVENOUS
  Administered 2020-07-29: 50 ug/min via INTRAVENOUS

## 2020-07-29 MED ORDER — ROCURONIUM BROMIDE 10 MG/ML (PF) SYRINGE
PREFILLED_SYRINGE | INTRAVENOUS | Status: DC | PRN
  Administered 2020-07-29: 20 mg via INTRAVENOUS
  Administered 2020-07-29: 60 mg via INTRAVENOUS

## 2020-07-29 MED ORDER — PROPOFOL 10 MG/ML IV BOLUS
INTRAVENOUS | Status: AC
  Filled 2020-07-29: qty 20

## 2020-07-29 MED ORDER — ONDANSETRON HCL 4 MG/2ML IJ SOLN
4.0000 mg | INTRAMUSCULAR | Status: DC
  Administered 2020-07-29: 4 mg via INTRAVENOUS
  Filled 2020-07-29 (×2): qty 2

## 2020-07-29 MED ORDER — HYDROMORPHONE HCL 1 MG/ML IJ SOLN: 0.2500 mg | INTRAMUSCULAR | Status: DC | PRN

## 2020-07-29 MED ORDER — VANCOMYCIN HCL IN DEXTROSE 1-5 GM/200ML-% IV SOLN
1000.0000 mg | INTRAVENOUS | Status: AC
  Administered 2020-07-29: 1000 mg via INTRAVENOUS
  Filled 2020-07-29: qty 200

## 2020-07-29 MED ORDER — BUPIVACAINE HCL 0.5 % IJ SOLN
INTRAMUSCULAR | Status: DC | PRN
  Administered 2020-07-29: 30 mL

## 2020-07-29 MED ORDER — BUPIVACAINE HCL (PF) 0.5 % IJ SOLN
INTRAMUSCULAR | Status: AC
  Filled 2020-07-29: qty 30

## 2020-07-29 MED ORDER — SUGAMMADEX SODIUM 200 MG/2ML IV SOLN
INTRAVENOUS | Status: DC | PRN
  Administered 2020-07-29: 180 mg via INTRAVENOUS

## 2020-07-29 MED ORDER — ESMOLOL HCL 100 MG/10ML IV SOLN
INTRAVENOUS | Status: DC | PRN
  Administered 2020-07-29 (×2): 30 mg via INTRAVENOUS

## 2020-07-29 MED ORDER — ALBUMIN HUMAN 5 % IV SOLN: INTRAVENOUS | Status: DC | PRN

## 2020-07-29 MED ORDER — PROPOFOL 10 MG/ML IV BOLUS
INTRAVENOUS | Status: DC | PRN
  Administered 2020-07-29: 120 mg via INTRAVENOUS

## 2020-07-29 MED ORDER — ACETAMINOPHEN 10 MG/ML IV SOLN
INTRAVENOUS | Status: AC
  Filled 2020-07-29: qty 100

## 2020-07-29 MED ORDER — ENOXAPARIN SODIUM 30 MG/0.3ML ~~LOC~~ SOLN
30.0000 mg | SUBCUTANEOUS | Status: DC
  Administered 2020-07-29: 30 mg via SUBCUTANEOUS
  Filled 2020-07-29: qty 0.3

## 2020-07-29 MED ORDER — FENTANYL CITRATE (PF) 250 MCG/5ML IJ SOLN
INTRAMUSCULAR | Status: AC
  Filled 2020-07-29: qty 5

## 2020-07-29 MED ORDER — BUPIVACAINE LIPOSOME 1.3 % IJ SUSP
INTRAMUSCULAR | Status: DC | PRN
  Administered 2020-07-29: 20 mL

## 2020-07-29 MED ORDER — LACTATED RINGERS IV SOLN: INTRAVENOUS | Status: DC

## 2020-07-29 MED ORDER — MIDAZOLAM HCL 2 MG/2ML IJ SOLN
INTRAMUSCULAR | Status: AC
  Filled 2020-07-29: qty 2

## 2020-07-29 MED ORDER — VANCOMYCIN HCL IN DEXTROSE 1-5 GM/200ML-% IV SOLN
1000.0000 mg | Freq: Two times a day (BID) | INTRAVENOUS | Status: AC
  Administered 2020-07-29 (×2): 1000 mg via INTRAVENOUS
  Filled 2020-07-29: qty 200

## 2020-07-29 MED ORDER — PROMETHAZINE HCL 25 MG/ML IJ SOLN
6.2500 mg | INTRAMUSCULAR | Status: DC | PRN
Start: 2020-07-29 — End: 2020-07-29

## 2020-07-29 MED ORDER — INSULIN ASPART 100 UNIT/ML ~~LOC~~ SOLN: 0.0000 [IU] | Freq: Four times a day (QID) | SUBCUTANEOUS | Status: DC

## 2020-07-29 MED ORDER — 0.9 % SODIUM CHLORIDE (POUR BTL) OPTIME
TOPICAL | Status: DC | PRN
Start: 2020-07-29 — End: 2020-07-29
  Administered 2020-07-29: 2000 mL

## 2020-07-29 MED ORDER — MIDAZOLAM HCL 2 MG/2ML IJ SOLN: 0.5000 mg | Freq: Once | INTRAMUSCULAR | Status: DC | PRN

## 2020-07-29 MED ORDER — KETOROLAC TROMETHAMINE 15 MG/ML IJ SOLN
15.0000 mg | Freq: Four times a day (QID) | INTRAMUSCULAR | Status: DC
  Administered 2020-07-29 – 2020-07-30 (×3): 15 mg via INTRAVENOUS
  Filled 2020-07-29 (×2): qty 1

## 2020-07-29 MED ORDER — CHLORHEXIDINE GLUCONATE 0.12 % MT SOLN
15.0000 mL | Freq: Once | OROMUCOSAL | Status: AC
  Administered 2020-07-29: 15 mL via OROMUCOSAL
  Filled 2020-07-29: qty 15

## 2020-07-29 MED ORDER — MORPHINE SULFATE (PF) 2 MG/ML IV SOLN: 1.0000 mg | INTRAVENOUS | Status: DC | PRN

## 2020-07-29 MED ORDER — AZELASTINE HCL 0.15 % NA SOLN: 1.0000 | Freq: Every day | NASAL | Status: DC

## 2020-07-29 MED ORDER — OXYCODONE HCL 5 MG/5ML PO SOLN: 5.0000 mg | Freq: Once | ORAL | Status: DC | PRN

## 2020-07-29 MED ORDER — KETOROLAC TROMETHAMINE 15 MG/ML IJ SOLN
INTRAMUSCULAR | Status: AC
  Filled 2020-07-29: qty 1

## 2020-07-29 MED ORDER — EPHEDRINE SULFATE-NACL 50-0.9 MG/10ML-% IV SOSY
PREFILLED_SYRINGE | INTRAVENOUS | Status: DC | PRN
  Administered 2020-07-29: 5 mg via INTRAVENOUS

## 2020-07-29 MED ORDER — MEPERIDINE HCL 25 MG/ML IJ SOLN: 6.2500 mg | INTRAMUSCULAR | Status: DC | PRN

## 2020-07-29 MED ORDER — FENTANYL CITRATE (PF) 100 MCG/2ML IJ SOLN
INTRAMUSCULAR | Status: DC | PRN
  Administered 2020-07-29 (×2): 50 ug via INTRAVENOUS
  Administered 2020-07-29: 150 ug via INTRAVENOUS
  Administered 2020-07-29 (×2): 50 ug via INTRAVENOUS

## 2020-07-29 MED ORDER — ACETAMINOPHEN 500 MG PO TABS
1000.0000 mg | ORAL_TABLET | Freq: Once | ORAL | Status: AC
  Administered 2020-07-29: 1000 mg via ORAL
  Filled 2020-07-29: qty 2

## 2020-07-29 MED ORDER — BUPIVACAINE LIPOSOME 1.3 % IJ SUSP
20.0000 mL | INTRAMUSCULAR | Status: DC
  Filled 2020-07-29: qty 20

## 2020-07-29 MED ORDER — LACTATED RINGERS IV SOLN: INTRAVENOUS | Status: DC | PRN

## 2020-07-29 MED ORDER — ONDANSETRON HCL 4 MG/2ML IJ SOLN
INTRAMUSCULAR | Status: DC | PRN
  Administered 2020-07-29: 4 mg via INTRAVENOUS

## 2020-07-29 SURGICAL SUPPLY — 67 items
ADH SKN CLS APL DERMABOND .7 (GAUZE/BANDAGES/DRESSINGS) ×3
BLADE SURG 11 STRL SS (BLADE) ×4 IMPLANT
CANISTER SUCT 3000ML PPV (MISCELLANEOUS) ×8 IMPLANT
CANNULA REDUC XI 12-8 STAPL (CANNULA)
CANNULA REDUCER 12-8 DVNC XI (CANNULA) IMPLANT
CATH THORACIC 28FR (CATHETERS) IMPLANT
DEFOGGER SCOPE WARMER CLEARIFY (MISCELLANEOUS) ×4 IMPLANT
DERMABOND ADVANCED (GAUZE/BANDAGES/DRESSINGS) ×1
DERMABOND ADVANCED .7 DNX12 (GAUZE/BANDAGES/DRESSINGS) ×5 IMPLANT
DEVICE SUTURE ENDOST 10MM (ENDOMECHANICALS) ×1 IMPLANT
DRAIN PENROSE 1/4X12 LTX STRL (WOUND CARE) ×4 IMPLANT
DRAPE COLUMN DVNC XI (DISPOSABLE) ×12 IMPLANT
DRAPE CV SPLIT W-CLR ANES SCRN (DRAPES) ×4 IMPLANT
DRAPE DA VINCI XI COLUMN (DISPOSABLE) ×16
DRAPE INCISE IOBAN 66X45 STRL (DRAPES) IMPLANT
DRAPE ORTHO SPLIT 77X108 STRL (DRAPES) ×4
DRAPE SURG ORHT 6 SPLT 77X108 (DRAPES) ×3 IMPLANT
ELECT REM PT RETURN 9FT ADLT (ELECTROSURGICAL) ×4
ELECTRODE REM PT RTRN 9FT ADLT (ELECTROSURGICAL) ×3 IMPLANT
FELT TEFLON 1X6 (MISCELLANEOUS) IMPLANT
GAUZE SPONGE 4X4 12PLY STRL (GAUZE/BANDAGES/DRESSINGS) IMPLANT
GLOVE BIO SURGEON STRL SZ7.5 (GLOVE) ×8 IMPLANT
GOWN STRL REUS W/ TWL LRG LVL3 (GOWN DISPOSABLE) ×3 IMPLANT
GOWN STRL REUS W/ TWL XL LVL3 (GOWN DISPOSABLE) ×6 IMPLANT
GOWN STRL REUS W/TWL 2XL LVL3 (GOWN DISPOSABLE) ×4 IMPLANT
GOWN STRL REUS W/TWL LRG LVL3 (GOWN DISPOSABLE) ×4
GOWN STRL REUS W/TWL XL LVL3 (GOWN DISPOSABLE) ×8
GRASPER SUT TROCAR 14GX15 (MISCELLANEOUS) IMPLANT
HEMOSTAT SURGICEL 2X14 (HEMOSTASIS) IMPLANT
IV NS 1000ML (IV SOLUTION)
IV NS 1000ML BAXH (IV SOLUTION) IMPLANT
KIT BASIN OR (CUSTOM PROCEDURE TRAY) ×4 IMPLANT
KIT TURNOVER KIT B (KITS) ×4 IMPLANT
MATRIX GENTRIX 7.5X6 HIATAL (Tissue) ×3 IMPLANT
NDL 18GX1X1/2 (RX/OR ONLY) (NEEDLE) IMPLANT
NEEDLE 18GX1X1/2 (RX/OR ONLY) (NEEDLE) IMPLANT
NEEDLE HYPO 22GX1.5 SAFETY (NEEDLE) ×4 IMPLANT
NS IRRIG 1000ML POUR BTL (IV SOLUTION) ×8 IMPLANT
PACK CHEST (CUSTOM PROCEDURE TRAY) ×4 IMPLANT
PAD ARMBOARD 7.5X6 YLW CONV (MISCELLANEOUS) ×8 IMPLANT
PORT ACCESS TROCAR AIRSEAL 12 (TROCAR) ×3 IMPLANT
PORT ACCESS TROCAR AIRSEAL 5M (TROCAR) ×1
SEAL CANN UNIV 5-8 DVNC XI (MISCELLANEOUS) ×12 IMPLANT
SEAL XI 5MM-8MM UNIVERSAL (MISCELLANEOUS) ×16
SEALER SYNCHRO 8 IS4000 DV (MISCELLANEOUS) ×4
SEALER SYNCHRO 8 IS4000 DVNC (MISCELLANEOUS) ×1 IMPLANT
SET TRI-LUMEN FLTR TB AIRSEAL (TUBING) ×4 IMPLANT
SHEET MEDIUM DRAPE 40X70 STRL (DRAPES) ×4 IMPLANT
STAPLER CANNULA SEAL DVNC XI (STAPLE) IMPLANT
STAPLER CANNULA SEAL XI (STAPLE)
SUT ETHIBOND 0 36 GRN (SUTURE) ×8 IMPLANT
SUT SILK  1 MH (SUTURE) ×4
SUT SILK 1 MH (SUTURE) ×3 IMPLANT
SUT SURGIDAC NAB ES-9 0 48 120 (SUTURE) IMPLANT
SUT VIC AB 3-0 SH 27 (SUTURE) ×8
SUT VIC AB 3-0 SH 27X BRD (SUTURE) ×6 IMPLANT
SUT VIC AB 3-0 X1 27 (SUTURE) ×8 IMPLANT
SUT VICRYL 0 UR6 27IN ABS (SUTURE) ×8 IMPLANT
SYR 10ML LL (SYRINGE) IMPLANT
SYR 20CC LL (SYRINGE) ×8 IMPLANT
SYSTEM SAHARA CHEST DRAIN ATS (WOUND CARE) IMPLANT
TOWEL GREEN STERILE (TOWEL DISPOSABLE) ×4 IMPLANT
TOWEL GREEN STERILE FF (TOWEL DISPOSABLE) ×4 IMPLANT
TRAY FOLEY MTR SLVR 16FR STAT (SET/KITS/TRAYS/PACK) ×4 IMPLANT
TROCAR XCEL BLADELESS 5X75MML (TROCAR) IMPLANT
TROCAR XCEL NON-BLD 5MMX100MML (ENDOMECHANICALS) IMPLANT
WATER STERILE IRR 1000ML POUR (IV SOLUTION) ×4 IMPLANT

## 2020-07-29 NOTE — Transfer of Care (Signed)
Immediate Anesthesia Transfer of Care Note  Patient: Kimberly Beck  Procedure(s) Performed: XI ROBOTIC ASSISTED PARAESOPHAGEAL HERNIA REPAIR (N/A Chest) ESOPHAGOGASTRODUODENOSCOPY (EGD) (N/A ) XI ROBOTIC ASSISTED LAPAROSCOPIC FUNDOPLICATION  Patient Location: PACU  Anesthesia Type:General  Level of Consciousness: awake, alert , oriented and patient cooperative  Airway & Oxygen Therapy: Patient Spontanous Breathing and Patient connected to face mask oxygen  Post-op Assessment: Report given to RN and Post -op Vital signs reviewed and stable  Post vital signs: Reviewed and stable  Last Vitals:  Vitals Value Taken Time  BP 116/74 07/29/20 1134  Temp    Pulse 81 07/29/20 1140  Resp 14 07/29/20 1140  SpO2 96 % 07/29/20 1140  Vitals shown include unvalidated device data.  Last Pain:  Vitals:   07/29/20 0600  TempSrc: Oral         Complications: No complications documented.

## 2020-07-29 NOTE — Brief Op Note (Addendum)
07/29/2020  11:07 AM  PATIENT:  Kimberly Beck  75 y.o. female  PRE-OPERATIVE DIAGNOSIS:  PARAESOPHAGEAL HERNIA  POST-OPERATIVE DIAGNOSIS:  PARAESOPHAGEAL HERNIA  PROCEDURE:XI ROBOTIC ASSISTED PARAESOPHAGEAL HERNIA REPAIR, ESOPHAGOGASTRODUODENOSCOPY (EGD), XI ROBOTIC ASSISTED LAPAROSCOPIC FUNDOPLICATION Findings: incidental cyst on liver  SURGEON:  Surgeon(s) and Role:    Lightfoot, Lucile Crater, MD - Primary  PHYSICIAN ASSISTANT: Lars Pinks PA-C  ANESTHESIA:   general  EBL:  30 mL   BLOOD ADMINISTERED:none  DRAINS: none   LOCAL MEDICATIONS USED:  OTHER Exparel  SPECIMEN:  Source of Specimen:  Hernia sac  DISPOSITION OF SPECIMEN:  PATHOLOGY  COUNTS CORRECT:  YES  DICTATION: .Dragon Dictation  PLAN OF CARE: Admit to inpatient   PATIENT DISPOSITION:  PACU - hemodynamically stable.   Delay start of Pharmacological VTE agent (>24hrs) due to surgical blood loss or risk of bleeding: yes

## 2020-07-29 NOTE — Anesthesia Procedure Notes (Signed)
Procedure Name: Intubation Performed by: Cleda Daub, CRNA Pre-anesthesia Checklist: Patient identified, Emergency Drugs available, Suction available and Patient being monitored Patient Re-evaluated:Patient Re-evaluated prior to induction Oxygen Delivery Method: Circle system utilized Preoxygenation: Pre-oxygenation with 100% oxygen Induction Type: IV induction Ventilation: Mask ventilation without difficulty Laryngoscope Size: Glidescope and 3 Grade View: Grade I Tube type: Oral Tube size: 7.0 mm Number of attempts: 1 Airway Equipment and Method: Stylet and Oral airway Placement Confirmation: ETT inserted through vocal cords under direct vision,  positive ETCO2 and breath sounds checked- equal and bilateral Secured at: 19 cm Tube secured with: Tape Dental Injury: Teeth and Oropharynx as per pre-operative assessment

## 2020-07-29 NOTE — Interval H&P Note (Signed)
History and Physical Interval Note:  Slightly anxious today.  She has been evaluated by cardiology, and her tachycardia was evaluated, and required no further interventions.    Vitals:   07/29/20 0600  BP: (!) 129/56  Pulse: 61  Resp: 17  Temp: 98 F (36.7 C)  SpO2: 100%   Alert NAD Sinus brady EWOB Abd soft    07/29/2020 7:38 AM  Kimberly Beck  has presented today for surgery, with the diagnosis of PEH.  The various methods of treatment have been discussed with the patient and family. After consideration of risks, benefits and other options for treatment, the patient has consented to  Procedure(s): XI ROBOTIC ASSISTED PARAESOPHAGEAL HERNIA REPAIR (N/A) as a surgical intervention.  The patient's history has been reviewed, patient examined, no change in status, stable for surgery.  I have reviewed the patient's chart and labs.  Questions were answered to the patient's satisfaction.     Kimberly Beck

## 2020-07-29 NOTE — Discharge Summary (Addendum)
Physician Discharge Summary       [address]       [place] [address]             [phone]    Patient ID: Kimberly Beck MRN: [id number] DOB/AGE: Aug 13, 1945 75 y.o.  Admit date: 07/29/2020 Discharge date: 07/30/2020  Admission Diagnoses: Paraesophageal hernia  Discharge Diagnoses:  1. S/p XI ROBOTIC ASSISTED PARAESOPHAGEAL HERNIA REPAIR, ESOPHAGOGASTRODUODENOSCOPY (EGD), XI ROBOTIC ASSISTED LAPAROSCOPIC FUNDOPLICATION 2. History of essential hypertension 3. History of sinus tachycardia 4. 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 History of remote tobacco abuse  Procedure (s):   Procedure: - Esophagoscopy - Robotic assisted laparoscopy - Paraesophageal hernia repair with A-cell mesh - Dor Fundoplication  History of Presenting Illness: [name], known to Dr. Kipp Brood from previous robotic assisted right middle lobectomy, lymph node dissection, and intercostal nerve block on 04/29/2020, who was found to have a paraesophageal/large hiatal hernia. She was found to have sinus tachycardia (with PAC's) so she was evaluated by Dr. Gwenlyn Found, prior to discussion about surgical intervention for her hernia. TSH was checked and was normal.   Echo was done and showed LVEF 50-55% and no significant valvular disease or pericardial effusion. She was put on Toprol XL. She was cleared from a cardiology standpoint. Dr. Kipp Brood discussed robotic assisted repair of the paraesophageal hernia. Potential risks, benefits, and complications of the surgery were discussed with the patient and she agreed to proceed with surgery. She underwent   Robotic assisted paraesophageal hernia repair, EGD, and laparoscopic fundoplication on 39/76/7341.  Brief Hospital Course:   The patient tolerated the procedure without difficulty.  The patient was extubated and taken to the PACU in stable condition.  The patient has done well post operatively.   She underwent a swallow study which did not show evidence of a leak.  She will be started on a soft diet.  This will be continued for several weeks.  She will need to crush all tablet medications for 1 week post operatively.  She had a mild elevation in her creatinine, felt to be related to Toradol use.  This medication was subsequently discontinued.  She was treated with some IV fluids as well.  Her surgical incisions are healing without infection.  She is ambulating without difficulty.  She is medically stable for discharge home today.  Latest Vital Signs: Blood pressure 137/63, pulse 69, temperature 98.1 F (36.7 C), temperature source Oral, resp. rate 11, height 5\' 5"  (1.651 m), weight 67.6 kg, SpO2 100 %.  Physical Exam: (by Jadene Pierini PA-C)  General appearance: alert, cooperative and no distress Heart: regular rate and rhythm Lungs: min dim in bases Abdomen: soft, nontender Extremities: PAS in place, no edema Wound: incis healing well  Discharge Condition: Stable and discharged to home.  Recent laboratory studies:  Lab Results  Component Value Date   WBC 12.3 (H) 07/30/2020   HGB 11.5 (L) 07/30/2020   HCT 37.7 07/30/2020   MCV 92.0 07/30/2020   PLT 199 07/30/2020   Lab Results  Component Value Date   NA 138 07/30/2020   K 4.1 07/30/2020   CL 102 07/30/2020   CO2 24 07/30/2020   CREATININE 1.28 (H) 07/30/2020   GLUCOSE 121 (H) 07/30/2020      Diagnostic Studies: DG Chest 2 View  Result Date: 07/27/2020 CLINICAL DATA:  Paraesophageal hernia EXAM: CHEST - 2 VIEW COMPARISON:  06/04/2020 FINDINGS: Hyperinflation. Heart size and  pulmonary vascularity are normal. Lungs are clear and expanded. Interval resolution of previous right lung infiltrates. Mediastinal contours appear intact. Large esophageal hiatal hernia behind the heart. IMPRESSION: Large esophageal hiatal hernia behind the heart. No evidence of active pulmonary disease. Electronically Signed   By: Lucienne Capers  M.D.   On: 07/27/2020 21:03   DG Chest Port 1 View  Result Date: 07/29/2020 CLINICAL DATA:  Follow-up esophageal hernia repair. EXAM: PORTABLE CHEST 1 VIEW COMPARISON:  07/27/2018 FINDINGS: Heart size remains normal. The patient has had repair of a paraesophageal hernia. There continues to be an abnormal chest radiographic appearance in that region which could not be differentiated from residual or recurrent hernia, but this could possibly represent a post reduction space that will resolve over time. Mild bibasilar atelectasis. IMPRESSION: Status post repair of a paraesophageal hernia. Persistent abnormal chest radiographic appearance in that region. This could not be differentiated from residual or recurrent hernia, but this could possibly represent a post reduction space that will resolve over time. Electronically Signed   By: Nelson Chimes M.D.   On: 07/29/2020 12:33   ECHOCARDIOGRAM COMPLETE  Result Date: 07/21/2020    ECHOCARDIOGRAM REPORT   Patient Name:   Kimberly Beck Date of Exam: 07/21/2020 Medical Rec #:  [id number]          Height:       65.0 in Accession #:    [id number]         Weight:       148.0 lb Date of Birth:  1945/11/10          BSA:          1.741 m Patient Age:    14 years           BP:           116/78 mmHg Patient Gender: F                  HR:           107 bpm. Exam Location:  [address] Procedure: 2D Echo, Cardiac Doppler and Color Doppler Indications:    Encounter for pre-operative cardiovascular clearance [740101]  History:        Patient has no prior history of Echocardiogram examinations.                 Risk Factors:Former Smoker and Hypertension.  Sonographer:    Vickie Epley RDCS Referring Phys: [id number] Pearle[address] [place]ge BERRY  Sonographer Comments: Image acquisition challenging due to respiratory motion. IMPRESSIONS  1. Abnormal septal motion inferior hypokinesis . Left ventricular ejection fraction, by estimation, is 50 to 55%. The left ventricle has low normal function. The  left ventricle demonstrates regional wall motion abnormalities (see scoring diagram/findings for description). Left ventricular diastolic parameters are consistent with Grade I diastolic dysfunction (impaired relaxation).  2. Right ventricular systolic function is normal. The right ventricular size is normal.  3. The mitral valve is abnormal. Trivial mitral valve regurgitation. No evidence of mitral stenosis. Moderate mitral annular calcification.  4. The aortic valve is normal in structure. Aortic valve regurgitation is not visualized. No aortic stenosis is present.  5. The inferior vena cava is normal in size with greater than 50% respiratory variability, suggesting right atrial pressure of 3 mmHg. FINDINGS  Left Ventricle: Abnormal septal motion inferior hypokinesis. Left ventricular ejection fraction, by estimation, is 50 to 55%. The left ventricle has low normal function. The left ventricle demonstrates regional wall motion abnormalities. The  left ventricular internal cavity size was normal in size. There is no left ventricular hypertrophy. Left ventricular diastolic parameters are consistent with Grade I diastolic dysfunction (impaired relaxation). Right Ventricle: The right ventricular size is normal. No increase in right ventricular wall thickness. Right ventricular systolic function is normal. Left Atrium: Left atrial size was normal in size. Right Atrium: Right atrial size was normal in size. Pericardium: There is no evidence of pericardial effusion. Mitral Valve: The mitral valve is abnormal. Moderate mitral annular calcification. Trivial mitral valve regurgitation. No evidence of mitral valve stenosis. Tricuspid Valve: The tricuspid valve is normal in structure. Tricuspid valve regurgitation is not demonstrated. No evidence of tricuspid stenosis. Aortic Valve: The aortic valve is normal in structure. Aortic valve regurgitation is not visualized. No aortic stenosis is present. Pulmonic Valve: The pulmonic  valve was normal in structure. Pulmonic valve regurgitation is not visualized. No evidence of pulmonic stenosis. Aorta: The aortic root is normal in size and structure. Venous: The inferior vena cava is normal in size with greater than 50% respiratory variability, suggesting right atrial pressure of 3 mmHg. IAS/Shunts: No atrial level shunt detected by color flow Doppler.  LEFT VENTRICLE PLAX 2D LVIDd:         3.30 cm     Diastology LVIDs:         2.20 cm     LV e' medial:    3.73 cm/s LV PW:         0.70 cm     LV E/e' medial:  12.4 LV IVS:        0.70 cm     LV e' lateral:   5.59 cm/s LVOT diam:     1.80 cm     LV E/e' lateral: 8.3 LV SV:         36 LV SV Index:   21 LVOT Area:     2.54 cm  LV Volumes (MOD) LV vol d, MOD A2C: 49.4 ml LV vol d, MOD A4C: 52.0 ml LV vol s, MOD A2C: 23.0 ml LV vol s, MOD A4C: 25.0 ml LV SV MOD A2C:     26.4 ml LV SV MOD A4C:     52.0 ml LV SV MOD BP:      26.8 ml RIGHT VENTRICLE RV S prime:     8.17 cm/s TAPSE (M-mode): 1.9 cm LEFT ATRIUM           Index       RIGHT ATRIUM           Index LA diam:      2.40 cm 1.38 cm/m  RA Area:     12.30 cm LA Vol (A2C): 23.2 ml 13.33 ml/m RA Volume:   29.10 ml  16.72 ml/m LA Vol (A4C): 32.9 ml 18.90 ml/m  AORTIC VALVE LVOT Vmax:   66.20 cm/s LVOT Vmean:  48.300 cm/s LVOT VTI:    0.141 m  AORTA Ao Root diam: 3.20 cm MITRAL VALVE MV Area (PHT): 5.62 cm    SHUNTS MV Decel Time: 135 msec    Systemic VTI:  0.14 m MV E velocity: 46.30 cm/s  Systemic Diam: 1.80 cm MV A velocity: 68.20 cm/s MV E/A ratio:  0.68 Jenkins Rouge MD Electronically signed by Jenkins Rouge MD Signature Date/Time: 07/21/2020/10:11:01 AM    Final    DG ESOPHAGUS W SINGLE CM (SOL OR THIN BA)  Result Date: 07/30/2020 CLINICAL DATA:  Post prior hiatal hernia repair EXAM: ESOPHOGRAM/BARIUM SWALLOW TECHNIQUE: Single contrast examination  was performed using water-soluble contrast. FLUOROSCOPY TIME:  Fluoroscopy Time:  36 seconds Radiation Exposure Index (if provided by the  fluoroscopic device): 23.9 mGy COMPARISON:  None. FINDINGS: Patient swallowed contrast without difficulty. Contrast readily passes into the stomach. Mild delay and retention at the gastroesophageal junction probably reflects postoperative edema. There is no evidence of extravasation. No hiatal hernia. IMPRESSION: No evidence of leak. Contrast readily passes into the stomach with mild delay/retention probably secondary to postoperative edema. Electronically Signed   By: Macy Mis M.D.   On: 07/30/2020 08:38    Discharge Medications: Allergies as of 07/30/2020      Reactions   Codeine Itching   Fluticasone Propionate    Other reaction(s): dizziness   Nsaids Other (See Comments)   Liver    Other    Other reaction(s): diarrhea and headache Other reaction(s): Global anesthesia Other reaction(s): edema   Penicillins Rash   Dry rash      Medication List    STOP taking these medications   acetaminophen 500 MG tablet Commonly known as: TYLENOL     TAKE these medications   albuterol 108 (90 Base) MCG/ACT inhaler Commonly known as: VENTOLIN HFA Inhale 2 puffs into the lungs every 6 (six) hours as needed for wheezing or shortness of breath.   aspirin EC 81 MG tablet Take 81 mg by mouth daily. Swallow whole.   Azelastine HCl 0.15 % Soln Place 1 spray into both nostrils in the morning and at bedtime.   escitalopram 10 MG tablet Commonly known as: LEXAPRO Take 10 mg by mouth daily.   HYDROcodone-acetaminophen 7.5-325 mg/15 ml solution Commonly known as: HYCET Take 15 mLs by mouth every 4 (four) hours as needed for moderate pain.   lisinopril-hydrochlorothiazide 20-25 MG tablet Commonly known as: ZESTORETIC Take 1 tablet by mouth daily.   metoprolol succinate 25 MG 24 hr tablet Commonly known as: TOPROL-XL Take 1 tablet (25 mg total) by mouth daily. Take with or immediately following a meal.   montelukast 10 MG tablet Commonly known as: SINGULAIR Take 10 mg by mouth  daily.   multivitamin tablet Take 1 tablet by mouth daily.   ondansetron 8 MG disintegrating tablet Commonly known as: Zofran ODT Take 1 tablet (8 mg total) by mouth every 8 (eight) hours as needed for nausea or vomiting.   oxybutynin 5 MG 24 hr tablet Commonly known as: DITROPAN-XL Take 5 mg by mouth at bedtime.   potassium chloride 10 MEQ tablet Commonly known as: KLOR-CON Take 1 tablet (10 mEq total) by mouth daily.   Stiolto Respimat 2.5-2.5 MCG/ACT Aers Generic drug: Tiotropium Bromide-Olodaterol Inhale 2 puffs into the lungs daily.   Vitamin D 50 MCG (2000 UT) Caps Take 2,000 Units by mouth daily.       Follow Up Appointments:  Follow-up Information    Lajuana Matte, MD. Go on 07/29/2020.   Specialty: Cardiothoracic Surgery Contact information: [address] [place] [address] [phone]               Signed:  Ellamae Sia 07/30/2020, 9:50 AM

## 2020-07-29 NOTE — Anesthesia Procedure Notes (Signed)
Arterial Line Insertion Start/End1/27/2022 7:15 AM, 07/29/2020 7:25 AM Performed by: Murvin Natal, MD, anesthesiologist  Patient location: Pre-op. Preanesthetic checklist: patient identified, IV checked, site marked, risks and benefits discussed, surgical consent, monitors and equipment checked, pre-op evaluation, timeout performed and anesthesia consent Lidocaine 1% used for infiltration Left, radial was placed Catheter size: 20 Fr Hand hygiene performed , maximum sterile barriers used  and Seldinger technique used  Attempts: 1 Procedure performed using ultrasound guided technique. Ultrasound Notes:anatomy identified, needle tip was noted to be adjacent to the nerve/plexus identified, no ultrasound evidence of intravascular and/or intraneural injection and image(s) printed for medical record Following insertion, dressing applied and Biopatch. Post procedure assessment: normal and unchanged  Patient tolerated the procedure well with no immediate complications.

## 2020-07-29 NOTE — Anesthesia Postprocedure Evaluation (Signed)
Anesthesia Post Note  Patient: [name]  Procedure(s) Performed: XI ROBOTIC ASSISTED PARAESOPHAGEAL HERNIA REPAIR (N/A Chest) ESOPHAGOGASTRODUODENOSCOPY (EGD) (N/A ) XI ROBOTIC ASSISTED LAPAROSCOPIC FUNDOPLICATION     Patient location during evaluation: PACU Anesthesia Type: General Level of consciousness: awake and alert, patient cooperative and oriented Pain management: pain level controlled Vital Signs Assessment: post-procedure vital signs reviewed and stable Respiratory status: spontaneous breathing, nonlabored ventilation, respiratory function stable and patient connected to nasal cannula oxygen Cardiovascular status: blood pressure returned to baseline and stable Postop Assessment: no apparent nausea or vomiting Anesthetic complications: no   No complications documented.  Last Vitals:  Vitals:   07/29/20 1430 07/29/20 1445  BP:    Pulse: 71 71  Resp: 13 11  Temp:    SpO2: 100% 100%    Last Pain:  Vitals:   07/29/20 1345  TempSrc:   PainSc: Asleep                 Elsbeth Yearick,E. Azha Constantin

## 2020-07-29 NOTE — Op Note (Signed)
[address]       [place] [address]             [phone]        07/29/2020  Patient:  [name] Pre-Op Dx:  Paraesophageal hernia   History of Stage 1adenocarcinoma of the right middle Post-op Dx:  same Procedure: - Esophagoscopy - Robotic assisted laparoscopy - Paraesophageal hernia repair with A-cell mesh - Dor Fundoplication   Surgeon and Role:      * Emberlyn Burlison, Lucile Crater, MD - Primary    Josie Saunders, PA-C - assisting Anesthesia  general EBL:  minmal  Blood Administration: none Specimen:  Hernia sac   Counts: correct   Indications: Ms. Abrams underwent right middle lobectomy that was uncomplicated.  Postoperatively she has had this persistent tachycardia which has been evaluated in the [facility] clinic, and she was only noted to have frequent PACs.  She also has a pretty significant hiatal hernia and she would like this fixed.  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  She has been evaluated by cardiology and it was deemed safe to proceed with a robotic assisted paraesophageal hernia repair.    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  She had a large hiatal defect with stomach and omentum in the hernia sac.  We achieve good intra-abdominal length of the esophagus.  After completion of the repair, we performed a Dor fundoplication, and pexied the stomach to the right crus  Operative Technique: After the risks, benefits and alternatives were thoroughly discussed, the patient was brought to the operative theatre.  Anesthesia was induced, and the esophagoscope was passed through the oropharynx down to the stomach.  The scope was retroflexed and the hiatal hernia was clearly evident.  The scope was pulled back and the mucosal surface of the esophagus was visualized.    The esophagus was pallatous.  The scope was then parked at 25 cm  from the incisors.  The patient was then prepped and draped in normal sterile fashion.  An appropriate surgical pause was performed, and pre-operative antibiotics were dosed accordingly.  We began with a 1 cm incision 15 cm caudad from the xiphoid and slightly lateral to the umbilicus.  Using an Optiview we entered the peritoneal space.  The abdomen was then insufflated with CO2.  3 other robotic ports were placed to triangulate the hiatus.  Another 12 mm port was placed in place at the level of the umbilicus laterally for an assistant port and another 5 mm trocar was placed in the right lower quadrant for liver retractor.  The patient was then placed in steep reverse Trendelenburg and the liver was elevated to expose the esophageal hiatus.  And then the robot was docked.  We began by dividing the gastrohepatic ligament to expose the right diaphragmatic crus and then dissected the hernia sac in a clockwise fashion to mobilize there the stomach and esophagus.  We then divided the short gastrics and moved towards the right crus and completed our dissection along the esophageal hiatus.  A Penrose drain was then used to encircle the the esophagus and we continued our dissection up into the mediastinum.  Once we had achieved 3 to 4 cm of intra-abdominal esophagus we then proceeded to reapproximate the crura with 0 Ethibond sutures and the A-cell mesh as pledgets in an interrupted fashion.  The U-shaped patch was then sutured to the inferior portion of the hiatus.  The gastroscope was passed down through the lower esophageal sphincter into the stomach and would act as our bougie during this repair.  Next the stomach was passed  anterior to the esophagus and a Dor fundoplication was performed.  An air leak test was performed using the gastroscope.  No leak was evident.  The liver retractor was removed and all ports were removed under direct visualization.  The skin and soft tissue were closed with absorbable suture     The patient tolerated the procedure without any immediate complications, and was transferred to the PACU in stable condition.  Avalie Oconnor Bary Leriche

## 2020-07-30 ENCOUNTER — Encounter (HOSPITAL_COMMUNITY): Payer: Self-pay | Admitting: Thoracic Surgery (Cardiothoracic Vascular Surgery)

## 2020-07-30 ENCOUNTER — Inpatient Hospital Stay (HOSPITAL_COMMUNITY): Payer: Medicare Other

## 2020-07-30 LAB — BASIC METABOLIC PANEL
Anion gap: 12 (ref 5–15)
BUN: 26 mg/dL — ABNORMAL HIGH (ref 8–23)
CO2: 24 mmol/L (ref 22–32)
Calcium: 8.1 mg/dL — ABNORMAL LOW (ref 8.9–10.3)
Chloride: 102 mmol/L (ref 98–111)
Creatinine, Ser: 1.28 mg/dL — ABNORMAL HIGH (ref 0.44–1.00)
GFR, Estimated: 44 mL/min — ABNORMAL LOW (ref >60)
Glucose, Bld: 121 mg/dL — ABNORMAL HIGH (ref 70–99)
Potassium: 4.1 mmol/L (ref 3.5–5.1)
Sodium: 138 mmol/L (ref 135–145)

## 2020-07-30 LAB — CBC
HCT: 37.7 % (ref 36.0–46.0)
Hemoglobin: 11.5 g/dL — ABNORMAL LOW (ref 12.0–15.0)
MCH: 28 pg (ref 26.0–34.0)
MCHC: 30.5 g/dL (ref 30.0–36.0)
MCV: 92 fL (ref 80.0–100.0)
Platelets: 199 10*3/uL (ref 150–400)
RBC: 4.1 MIL/uL (ref 3.87–5.11)
RDW: 15.5 % (ref 11.5–15.5)
WBC: 12.3 10*3/uL — ABNORMAL HIGH (ref 4.0–10.5)
nRBC: 0 % (ref 0.0–0.2)

## 2020-07-30 LAB — GLUCOSE, CAPILLARY
Glucose-Capillary: 116 mg/dL — ABNORMAL HIGH (ref 70–99)
Glucose-Capillary: 109 mg/dL — ABNORMAL HIGH (ref 70–99)
Glucose-Capillary: 115 mg/dL — ABNORMAL HIGH (ref 70–99)

## 2020-07-30 LAB — SURGICAL PATHOLOGY

## 2020-07-30 MED ORDER — ONDANSETRON 8 MG PO TBDP: 8.0000 mg | ORAL_TABLET | Freq: Three times a day (TID) | ORAL | 0 refills | Status: DC | PRN

## 2020-07-30 MED ORDER — HYDROCODONE-ACETAMINOPHEN 7.5-325 MG/15ML PO SOLN: 15.0000 mL | ORAL | 0 refills | Status: DC | PRN

## 2020-07-30 MED ORDER — IOHEXOL 300 MG/ML  SOLN
150.0000 mL | Freq: Once | INTRAMUSCULAR | Status: AC | PRN
  Administered 2020-07-30: 150 mL via ORAL

## 2020-07-30 MED ORDER — DEXTROSE-NACL 5-0.9 % IV SOLN: INTRAVENOUS | Status: DC

## 2020-07-30 NOTE — Discharge Instructions (Signed)
1. All Medications Must Be Crushed for 7 days 2.  Please do not advance diet past soft foods until instructed by Dr. Kipp Brood, diet guidance is pasted below   Soft-Food Eating Plan A soft-food eating plan includes foods that are safe and easy to chew and swallow. Your health care provider or dietitian can help you find foods and flavors that fit into this plan. Follow this plan until your health care provider or dietitian says it is safe to start eating other foods and food textures. What are tips for following this plan? General guidelines  Take small bites of food, or cut food into pieces about  inch or smaller. Bite-sized pieces of food are easier to chew and swallow.  Eat moist foods. Avoid overly dry foods.  Avoid foods that: ? Are difficult to swallow, such as dry, chunky, crispy, or sticky foods. ? Are difficult to chew, such as hard, tough, or stringy foods. ? Contain nuts, seeds, or fruits.  Follow instructions from your dietitian about the types of liquids that are safe for you to swallow. You may be allowed to have: ? Thick liquids only. This includes only liquids that are thicker than honey. ? Thin and thick liquids. This includes all beverages and foods that become liquid at room temperature.  To make thick liquids: ? Purchase a commercial liquid thickening powder. These are available at grocery stores and pharmacies. ? Mix the thickener into liquids according to instructions on the label. ? Purchase ready-made thickened liquids. ? Thicken soup by pureeing, straining to remove chunks, and adding flour, potato flakes, or corn starch. ? Add commercial thickener to foods that become liquid at room temperature, such as milk shakes, yogurt, ice cream, gelatin, and sherbet.  Ask your health care provider whether you need to take a fiber supplement.   Cooking  Cook meats so they stay tender and moist. Use methods like braising, stewing, or baking in liquid.  Cook vegetables  and fruit until they are soft enough to be mashed with a fork.  Peel soft, fresh fruits such as peaches, nectarines, and melons.  When making soup, make sure chunks of meat and vegetables are smaller than  inch.  Reheat leftover foods slowly so that a tough crust does not form. What foods are allowed? The items listed below may not be a complete list. Talk with your dietitian about what dietary choices are best for you. Grains Breads, muffins, pancakes, or waffles moistened with syrup, jelly, or butter. Dry cereals well-moistened with milk. Moist, cooked cereals. Well-cooked pasta and rice. Vegetables All soft-cooked vegetables. Shredded lettuce. Fruits All canned and cooked fruits. Soft, peeled fresh fruits. Strawberries. Dairy Milk. Cream. Yogurt. Cottage cheese. Soft cheese without the rind. Meats and other protein foods Tender, moist ground meat, poultry, or fish. Meat cooked in gravy or sauces. Eggs. Sweets and desserts Ice cream. Milk shakes. Sherbet. Pudding. Fats and oils Butter. Margarine. Olive, canola, sunflower, and grapeseed oil. Smooth salad dressing. Smooth cream cheese. Mayonnaise. Gravy. What foods are not allowed? The items listed bemay not be a complete list. Talk with your dietitian about what dietary choices are best for you. Grains Coarse or dry cereals, such as bran, granola, and shredded wheat. Tough or chewy crusty breads, such as [place] bread or baguettes. Breads with nuts, seeds, or fruit. Vegetables All raw vegetables. Cooked corn. Cooked vegetables that are tough or stringy. Tough, crisp, fried potatoes and potato skins. Fruits Fresh fruits with skins or seeds, or both, such as  apples, pears, and grapes. Stringy, high-pulp fruits, such as papaya, pineapple, coconut, and mango. Fruit leather and all dried fruit. Dairy Yogurt with nuts or coconut. Meats and other protein foods Hard, dry sausages. Dry meat, poultry, or fish. Meats with gristle. Fish with  bones. Fried meat or fish. Lunch meat and hotdogs. Nuts and seeds. Chunky peanut butter or other nut butters. Sweets and desserts Cakes or cookies that are very dry or chewy. Desserts with dried fruit, nuts, or coconut. Fried pastries. Very rich pastries. Fats and oils Cream cheese with fruit or nuts. Salad dressings with seeds or chunks. Summary  A soft-food eating plan includes foods that are safe and easy to swallow. Generally, the foods should be soft enough to be mashed with a fork.  Avoid foods that are dry, hard to chew, crunchy, sticky, stringy, or crispy.  Ask your health care provider whether you need to thicken your liquids and if you need to take a fiber supplement. This information is not intended to replace advice given to you by your health care provider. Make sure you discuss any questions you have with your health care provider. Document Revised: 10/10/2018 Document Reviewed: 08/22/2016 Elsevier Patient Education  2021 [place] [facility], Care After The following information offers guidance on how to care for yourself after your procedure. Your health care provider may also give you more specific instructions. If you have problems or questions, contact your health care provider. What can I expect after the procedure? After the procedure, it is common to have:  Some pain and aches in the area of your surgical incisions.  Pain when breathing in (inhaling) and coughing.  Tiredness (fatigue).  Trouble sleeping.  Constipation. Follow these instructions at home: Medicines  Take over-the-counter and prescription medicines only as told by your health care provider.  If you were prescribed an antibiotic medicine, take it as told by your health care provider. Do not stop taking the antibiotic even if you start to feel better.  Talk with your health care provider about safe and effective ways to manage pain after your procedure. Pain  management should fit your specific health needs.  Take pain medicine before pain becomes severe. Relieving and controlling your pain will make breathing easier for you.  Ask your health care provider if the medicine prescribed to you requires you to avoid driving or using machinery. Eating and drinking Follow instructions from your health care provider about eating or drinking restrictions. These will vary depending on what procedure you had. Your health care provider may recommend:  A liquid diet or soft diet for the first few days.  Meals that are smaller and more frequent.  A diet of fruits, vegetables, whole grains, and low-fat proteins.  Limiting foods that are high in fat and processed sugar, including fried or sweet foods. Incision care  Follow instructions from your health care provider about how to take care of your incisions. Make sure you: ? Wash your hands with soap and water for at least 20 seconds before and after you change your bandage (dressing). If soap and water are not available, use hand sanitizer. ? Change your dressing as told by your health care provider. ? Leave stitches (sutures), skin glue, or adhesive strips in place. These skin closures may need to stay in place for 2 weeks or longer. If adhesive strip edges start to loosen and curl up, you may trim the loose edges. Do not remove adhesive strips completely unless  your health care provider tells you to do that.  Check your incision area every day for signs of infection. Check for: ? Redness, swelling, or more pain. ? Fluid or blood. ? Warmth. ? Pus or a bad smell. Activity  Return to your normal activities as told by your health care provider. Ask your health care provider what activities are safe for you.  Ask your health care provider when it is safe for you to drive.  Do not lift anything that is heavier than 10 lb (4.5 kg), or the limit that you are told, until your health care provider says that it  is safe.  Rest as told by your health care provider.  Avoid sitting for a long time without moving. Get up to take short walks every 1-2 hours. This is important to improve blood flow and breathing. Ask for help if you feel weak or unsteady.  Do exercises as told by your health care provider. Pneumonia prevention  Do deep breathing exercises and cough regularly as directed. This helps clear mucus and opens your lungs. Doing this helps prevent lung infection (pneumonia).  If you were given an incentive spirometer, use it as told. An incentive spirometer is a tool that measures how well you are filling your lungs with each breath.  Coughing may hurt less if you try to support your chest. This is called splinting. Try one of these when you cough: ? Hold a pillow against your chest. ? Place the palms of both hands on top of your incision area.  Do not use any products that contain nicotine or tobacco. These products include cigarettes, chewing tobacco, and vaping devices, such as e-cigarettes. If you need help quitting, ask your health care provider.  Avoid secondhand smoke.   General instructions  If you have a drainage tube: ? Follow instructions from your health care provider about how to take care of it. ? Do not travel by airplane after your tube is removed until your health care provider tells you it is safe.  You may need to take these actions to prevent or treat constipation: ? Drink enough fluid to keep your urine pale yellow. ? Take over-the-counter or prescription medicines. ? Eat foods that are high in fiber, such as beans, whole grains, and fresh fruits and vegetables. ? Limit foods that are high in fat and processed sugars, such as fried or sweet foods.  Keep all follow-up visits. This is important. Contact a health care provider if:  You have redness, swelling, or more pain around an incision.  You have fluid or blood coming from an incision.  An incision feels warm  to the touch.  You have pus or a bad smell coming from an incision.  You have a fever.  You cannot eat or drink without vomiting.  Your pain medicine is not controlling your pain. Get help right away if:  You have chest pain.  Your heart is beating quickly.  You have trouble breathing.  You have trouble speaking.  You are confused.  You feel weak or dizzy, or you faint. These symptoms may represent a serious problem that is an emergency. Do not wait to see if the symptoms will go away. Get medical help right away. Call your local emergency services (911 in the [place] Do not drive yourself to the hospital. Summary  Talk with your health care provider about safe and effective ways to manage pain after your procedure. Pain management should fit your specific health needs.  Return to your normal activities as told by your health care provider. Ask your health care provider what activities are safe for you.  Do deep breathing exercises and cough regularly as directed. This helps to clear mucus and prevent pneumonia. If it hurts to cough, ease pain by holding a pillow against your chest or by placing the palms of both hands over your incisions. This information is not intended to replace advice given to you by your health care provider. Make sure you discuss any questions you have with your health care provider. Document Revised: 03/12/2020 Document Reviewed: 03/12/2020 Elsevier Patient Education  2021 [employer].

## 2020-07-30 NOTE — Plan of Care (Signed)

## 2020-07-30 NOTE — Progress Notes (Addendum)
[facility]       [place] [address]             [phone]      1 Day Post-Op Procedure(s) (LRB): XI ROBOTIC ASSISTED PARAESOPHAGEAL HERNIA REPAIR (N/A) ESOPHAGOGASTRODUODENOSCOPY (EGD) (N/A) XI ROBOTIC ASSISTED LAPAROSCOPIC FUNDOPLICATION Subjective: Min discomfort, no nausea  Objective: Vital signs in last 24 hours: Temp:  [97.9 F (36.6 C)-98.7 F (37.1 C)] 98 F (36.7 C) (01/28 0356) Pulse Rate:  [69-83] 69 (01/28 0356) Cardiac Rhythm: Normal sinus rhythm (01/27 2000) Resp:  [9-20] 14 (01/28 0356) BP: (116-136)/(55-74) 132/61 (01/28 0356) SpO2:  [95 %-100 %] 100 % (01/28 0356) Arterial Line BP: (142-156)/(54-62) 156/61 (01/27 1515)  Hemodynamic parameters for last 24 hours:    Intake/Output from previous day: 01/27 0701 - 01/28 0700 In: 1900 [I.V.:1650; IV Piggyback:250] Out: 160 [Urine:130; Blood:30] Intake/Output this shift: No intake/output data recorded.  General appearance: alert, cooperative and no distress Heart: regular rate and rhythm Lungs: min dim in bases Abdomen: soft, nontender Extremities: PAS in place, no edema Wound: incis healing well  Lab Results: Recent Labs    07/27/20 1500 07/30/20 0051  WBC 9.4 12.3*  HGB 13.1 11.5*  HCT 42.0 37.7  PLT 241 199   BMET:  Recent Labs    07/27/20 1500 07/30/20 0051  NA 139 138  K 3.5 4.1  CL 105 102  CO2 21* 24  GLUCOSE 105* 121*  BUN 20 26*  CREATININE 1.16* 1.28*  CALCIUM 9.0 8.1*    PT/INR:  Recent Labs    07/27/20 1500  LABPROT 12.2  INR 0.9   ABG    Component Value Date/Time   PHART 7.495 (H) 07/27/2020 1515   HCO3 23.0 07/27/2020 1515   TCO2 28 04/29/2020 0945   O2SAT 98.3 07/27/2020 1515   CBG (last 3)  Recent Labs    07/29/20 2331 07/30/20 0107 07/30/20 0634  GLUCAP 123* 116* 109*    Meds Scheduled Meds: . enoxaparin (LOVENOX) injection  30 mg Subcutaneous Q24H  . insulin aspart  0-24 Units Subcutaneous Q6H  . ketorolac  15 mg  Intravenous Q6H  . ondansetron (ZOFRAN) IV  4 mg Intravenous Q4H   Continuous Infusions: PRN Meds:.morphine injection  Xrays DG Chest Port 1 View  Result Date: 07/29/2020 CLINICAL DATA:  Follow-up esophageal hernia repair. EXAM: PORTABLE CHEST 1 VIEW COMPARISON:  07/27/2018 FINDINGS: Heart size remains normal. The patient has had repair of a paraesophageal hernia. There continues to be an abnormal chest radiographic appearance in that region which could not be differentiated from residual or recurrent hernia, but this could possibly represent a post reduction space that will resolve over time. Mild bibasilar atelectasis. IMPRESSION: Status post repair of a paraesophageal hernia. Persistent abnormal chest radiographic appearance in that region. This could not be differentiated from residual or recurrent hernia, but this could possibly represent a post reduction space that will resolve over time. Electronically Signed   By: Nelson Chimes M.D.   On: 07/29/2020 12:33    Assessment/Plan: S/P Procedure(s) (LRB): XI ROBOTIC ASSISTED PARAESOPHAGEAL HERNIA REPAIR (N/A) ESOPHAGOGASTRODUODENOSCOPY (EGD) (N/A) XI ROBOTIC ASSISTED LAPAROSCOPIC FUNDOPLICATION  1 afeb, VSS 2 sats ok on 2 liters 3 BS adeq control- no DM hx 4 minor expected ABLA - monitor clinically 5 slight increase in creat, UOP - says she hasn't urinated- monitor - will stop toradol- minimal pain, will start low dose IVF 6 minor leukocytosis- reactive, monitor clinically 7 swallow study today, then  advance diet dep on results 8 routine pulm toilet/rehab    LOS: 1 day    John Giovanni PA-C Pager [phone] 07/30/2020   Agree with above Swallow clear Advancing diet If pain controlled will go home on Zofran, crushed tramadol, and soft mechanical diet.  All meds crushed for the first week.  [name]

## 2020-07-30 NOTE — Discharge Summary (Deleted)
Physician Discharge Summary  Patient ID: Kimberly Beck MRN: [id number] DOB/AGE: 74-Oct-1947 74 y.o.  Admit date: 07/29/2020 Discharge date: 07/30/2020  Admission Diagnoses:  Discharge Diagnoses:  Active Problems:   Paraesophageal hernia  Patient Active Problem List   Diagnosis Date Noted  . Paraesophageal hernia 07/29/2020  . Essential hypertension 07/14/2020  . Sinus tachycardia 07/14/2020  . S/P lobectomy of lung 04/29/2020  . Lung nodule 04/23/2020   HPI the patient is a 75 year old female who was referred to Dr. Kipp Beck for robotic paraesophageal hernia repair.  She has undergone previous right middle lobectomy that was uncomplicated.  Postoperatively she had a persistent tachycardia and was found to have frequent PACs.  She has been seen preoperatively by cardiology.  She is felt to have a very significant hiatal hernia with her stomach noted to be intrathoracic and behind the heart and they actually be contributing to the persistent tachycardia.  After evaluation it was felt that she was an adequate candidate to proceed with surgery and she was admitted this hospitalization for the procedure.  Discharged Condition: good  Hospital Course: Patient was admitted electively and on 07/29/2020 taken to the operating room where she underwent a robotic assisted laparoscopy with paraesophageal hernia repair with ACell mesh.  The full procedure as noted below.  She tolerated the procedure well and was taken to the postanesthesia care unit in stable condition.  Postoperative hospital course: The patient was doing well.  She has maintained stable hemodynamics and sinus rhythm.  She has a very mild expected acute blood loss anemia.  Oxygen was weaned and she maintains good saturations on room air.  Incisions are noted to be healing well without evidence of infection.  Her swallow study showed no evidence of leak.  The contrast readily passes into the stomach with mild delay/retention probably  secondary to postoperative edema.  Her diet is being advanced.  For the first week all medications including pain meds will be crushed.  Diet at discharge will be mechanical soft.  Overall at the time of discharge the patient is felt to be quite stable.   Consults: None  Significant Diagnostic Studies: labs: ROUTINE  Treatments: surgery:    07/29/2020  Patient:  Kimberly Beck Pre-Op Dx:     Paraesophageal hernia                         History of Stage 1adenocarcinoma of the right middle Post-op Dx:  same Procedure: - Esophagoscopy - Robotic assisted laparoscopy - Paraesophageal hernia repair with A-cell mesh - Dor Fundoplication   Surgeon and Role:      * Lightfoot, Lucile Crater, MD - Primary    Josie Saunders, PA-C - assisting Anesthesia  general EBL:  minmal  Blood Administration: none Specimen:  Hernia sac Discharge Exam: Blood pressure 137/63, pulse 69, temperature 98.1 F (36.7 C), temperature source Oral, resp. rate 11, height 5\' 5"  (1.651 m), weight 67.6 kg, SpO2 100 %.    General appearance: alert, cooperative and no distress Heart: regular rate and rhythm Lungs: min dim in bases Abdomen: soft, nontender Extremities: PAS in place, no edema Wound: incis healing well   Disposition: Discharge disposition: 01-Home or Self Care        Allergies as of 07/30/2020      Reactions   Codeine Itching   Fluticasone Propionate    Other reaction(s): dizziness   Nsaids Other (See Comments)   Liver    Other  Other reaction(s): diarrhea and headache Other reaction(s): Global anesthesia Other reaction(s): edema   Penicillins Rash   Dry rash      Medication List    STOP taking these medications   acetaminophen 500 MG tablet Commonly known as: TYLENOL     TAKE these medications   albuterol 108 (90 Base) MCG/ACT inhaler Commonly known as: VENTOLIN HFA Inhale 2 puffs into the lungs every 6 (six) hours as needed for wheezing or shortness of  breath.   aspirin EC 81 MG tablet Take 81 mg by mouth daily. Swallow whole.   Azelastine HCl 0.15 % Soln Place 1 spray into both nostrils in the morning and at bedtime.   escitalopram 10 MG tablet Commonly known as: LEXAPRO Take 10 mg by mouth daily.   HYDROcodone-acetaminophen 7.5-325 mg/15 ml solution Commonly known as: HYCET Take 15 mLs by mouth every 4 (four) hours as needed for moderate pain.   lisinopril-hydrochlorothiazide 20-25 MG tablet Commonly known as: ZESTORETIC Take 1 tablet by mouth daily.   metoprolol succinate 25 MG 24 hr tablet Commonly known as: TOPROL-XL Take 1 tablet (25 mg total) by mouth daily. Take with or immediately following a meal.   montelukast 10 MG tablet Commonly known as: SINGULAIR Take 10 mg by mouth daily.   multivitamin tablet Take 1 tablet by mouth daily.   ondansetron 8 MG disintegrating tablet Commonly known as: Zofran ODT Take 1 tablet (8 mg total) by mouth every 8 (eight) hours as needed for nausea or vomiting.   oxybutynin 5 MG 24 hr tablet Commonly known as: DITROPAN-XL Take 5 mg by mouth at bedtime.   potassium chloride 10 MEQ tablet Commonly known as: KLOR-CON Take 1 tablet (10 mEq total) by mouth daily.   Stiolto Respimat 2.5-2.5 MCG/ACT Aers Generic drug: Tiotropium Bromide-Olodaterol Inhale 2 puffs into the lungs daily.   Vitamin D 50 MCG (2000 UT) Caps Take 2,000 Units by mouth daily.       Follow-up Information    Lajuana Matte, MD. Go on 08/06/2020.   Specialty: Cardiothoracic Surgery Why: Appointment is at 12:30 Contact information: [place] [address] [phone]               Signed: John Giovanni PA-C 07/30/2020, 10:01 AM

## 2020-07-30 NOTE — Plan of Care (Signed)
  Problem: Education: Goal: Knowledge of General Education information will improve Description: Including pain rating scale, medication(s)/side effects and non-pharmacologic comfort measures Outcome: Progressing   Problem: Clinical Measurements: Goal: Ability to maintain clinical measurements within normal limits will improve Outcome: Progressing Goal: Will remain free from infection Outcome: Progressing Goal: Respiratory complications will improve Outcome: Progressing Goal: Cardiovascular complication will be avoided Outcome: Progressing   Problem: Coping: Goal: Level of anxiety will decrease Outcome: Progressing   Problem: Elimination: Goal: Will not experience complications related to bowel motility Outcome: Progressing

## 2020-07-30 NOTE — Progress Notes (Signed)
Discharge instructions reviewed with patient and questions answered.  Patient verbalized understanding of instructions and follow up appointments and medication use.  Patient via wheelchair to waiting vehicle in stable condition

## 2020-08-06 ENCOUNTER — Other Ambulatory Visit: Payer: Self-pay

## 2020-08-06 ENCOUNTER — Ambulatory Visit (INDEPENDENT_AMBULATORY_CARE_PROVIDER_SITE_OTHER): Payer: Self-pay | Admitting: Thoracic Surgery (Cardiothoracic Vascular Surgery)

## 2020-08-06 ENCOUNTER — Encounter: Payer: Self-pay | Admitting: Thoracic Surgery (Cardiothoracic Vascular Surgery)

## 2020-08-06 DIAGNOSIS — Z9889 Other specified postprocedural states: Secondary | ICD-10-CM

## 2020-08-06 DIAGNOSIS — Z8719 Personal history of other diseases of the digestive system: Secondary | ICD-10-CM | POA: Insufficient documentation

## 2020-08-06 NOTE — Progress Notes (Signed)
      [address]       [place] [address]             [phone]        Marybeth P Castagnola [place] Medical Record #[id number] Date of Birth: [birth year]  Referring: Lorretta Harp, MD Primary Care: Hulan Fess, MD Primary Cardiologist:No primary care provider on file.  Reason for visit:   follow-up  History of Present Illness:     Mrs. Quesnel has done extremely well.  She only complains of some incisional soreness.  No dysphagia or odynophagia.  She denies any reflux  Physical Exam: BP 127/81 (BP Location: Right Arm, Patient Position: Sitting)   Pulse 81   Resp 20   Wt 148 lb (67.1 kg)   SpO2 92% Comment: RA  BMI 24.63 kg/m   Alert NAD Incision clean Abdomen soft, ND no peripheral edema       Assessment / Plan:   75 yo female w/ hx of stage I adenocarcinoma of the RML s/p resection, now s/p PEH repair Doing well Will advance diet. RTC in 1 month   Lajuana Matte 08/06/2020 5:14 PM

## 2020-08-16 ENCOUNTER — Encounter: Payer: Self-pay | Admitting: Pulmonary Disease

## 2020-08-16 ENCOUNTER — Other Ambulatory Visit: Payer: Self-pay

## 2020-08-16 ENCOUNTER — Ambulatory Visit: Payer: Medicare Other | Admitting: Pulmonary Disease

## 2020-08-16 VITALS — BP 120/80 | HR 82 | Temp 98.5°F | Ht 65.0 in | Wt 148.2 lb

## 2020-08-16 DIAGNOSIS — Z902 Acquired absence of lung [part of]: Secondary | ICD-10-CM

## 2020-08-16 DIAGNOSIS — Z87891 Personal history of nicotine dependence: Secondary | ICD-10-CM | POA: Diagnosis not present

## 2020-08-16 DIAGNOSIS — J432 Centrilobular emphysema: Secondary | ICD-10-CM | POA: Diagnosis not present

## 2020-08-16 DIAGNOSIS — C3490 Malignant neoplasm of unspecified part of unspecified bronchus or lung: Secondary | ICD-10-CM

## 2020-08-16 DIAGNOSIS — C3491 Malignant neoplasm of unspecified part of right bronchus or lung: Secondary | ICD-10-CM

## 2020-08-16 NOTE — Progress Notes (Signed)
Synopsis: Referred in Oct 2021 for lung nodule PCP by Hulan Fess, MD  Subjective:   PATIENT ID: Kimberly Beck GENDER: female DOB: 10-25-1945, MRN: [id number]  Chief Complaint  Patient presents with  . Follow-up    3 month follow up.  Pt stated that she is doing well.     75 yo FM PMH HTN, DVT, RML GGO, with a semi-solid component concerning for malignancy, former smoker quit in 1987, 34 pack year history.  Patient is able to complete her activities of daily living.  She is not on any breathing treatments for respiratory complaints.  She has not had pulmonary function test in the past.  Here today for follow-up regarding CT scan imaging.  Initial imaging was in October 2020.  She had follow-up images in August 2021 which showed a right middle lobe groundglass nodule with a semisolid component that has more formed organization concerning for potential primary malignancy of the lung.  Patient denies fevers chills night sweats weight loss.  Or hemoptysis.  OV 05/20/2020: Patient seen today for follow-up after recent robotic lobectomy by Dr. Kipp Brood.  Patient was diagnosed with a stage T1 a adenocarcinoma.  Patient did well with surgery.  She still has some breathlessness associated with her exertional activity.  She is currently not on any inhalers.  She feels like she is slowly recovering back to her baseline.  She had follow-up with Dr. Kipp Brood on 05/07/2020.  Office note reviewed.  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  Case was discussed in medical thoracic oncology tumor board and no adjuvant chemotherapy is necessary.  She has a 1 month follow-up scheduled with Dr. Kipp Brood.  OV 08/16/2020: recent hiatal hernia repair by Dr. Kipp Brood. Doing well post-op from that.  Patient from a respiratory standpoint is doing well.  She has recovered nicely from both procedures.  She has her 76-month noncontrasted CT follow-up in April  2022 which will be 6 months from her surgical lobectomy.  Patient denies fever chills night sweats weight loss.  She has not been using her inhalers.  As needed use of albuterol still.  She felt like the Stiolto made her cough and she did not notice much difference in her breathing.   Past Medical History:  Diagnosis Date  . Allergy   . Anemia   . Cancer ([facility])   . Complication of anesthesia   . Depression   . DVT, lower extremity, recurrent, right ([facility])   . History of hiatal hernia   . HTN (hypertension)   . Lung cancer ([facility])    Right  . Pneumonia   . PONV (postoperative nausea and vomiting)    N/V only after receiving ether.  . Pre-diabetes    BS checks daily, takes no medicines     Family History  Problem Relation Age of Onset  . Heart attack Father   . Stroke Maternal Grandfather      Past Surgical History:  Procedure Laterality Date  . ESOPHAGOGASTRODUODENOSCOPY N/A 07/29/2020   Procedure: ESOPHAGOGASTRODUODENOSCOPY (EGD);  Surgeon: Lajuana Matte, MD;  Location: [facility];  Service: Thoracic;  Laterality: N/A;  . EYE SURGERY     Bilateral cataract removal  . INTERCOSTAL NERVE BLOCK Right 04/29/2020   Procedure: INTERCOSTAL NERVE BLOCK;  Surgeon: Lajuana Matte, MD;  Location: [facility];  Service: Thoracic;  Laterality: Right;  . LYMPH NODE DISSECTION Right 04/29/2020   Procedure: LYMPH NODE DISSECTION;  Surgeon: Kipp Brood,  Lucile Crater, MD;  Location: [facility];  Service: Thoracic;  Laterality: Right;  . TONSILLECTOMY    . TONSILLECTOMY AND ADENOIDECTOMY    . TUBAL LIGATION    . XI ROBOTIC ASSISTED PARAESOPHAGEAL HERNIA REPAIR N/A 07/29/2020   Procedure: XI ROBOTIC ASSISTED PARAESOPHAGEAL HERNIA REPAIR;  Surgeon: Lajuana Matte, MD;  Location: [facility];  Service: Thoracic;  Laterality: N/A;    Social History   Socioeconomic History  . Marital status: Single    Spouse name: Not on file  . Number of children: Not on file  . Years of education: Not on file  .  Highest education level: Not on file  Occupational History  . Not on file  Tobacco Use  . Smoking status: Former Smoker    Packs/day: 3.00    Years: 25.00    Pack years: 75.00    Start date: 1962    Quit date: 1987    Years since quitting: 35.1  . Smokeless tobacco: Never Used  Vaping Use  . Vaping Use: Never used  Substance and Sexual Activity  . Alcohol use: Never  . Drug use: Never  . Sexual activity: Not on file  Other Topics Concern  . Not on file  Social History Narrative  . Not on file   Social Determinants of Health   Financial Resource Strain: Not on file  Food Insecurity: Not on file  Transportation Needs: Not on file  Physical Activity: Not on file  Stress: Not on file  Social Connections: Not on file  Intimate Partner Violence: Not on file     Allergies  Allergen Reactions  . Codeine Itching  . Fluticasone Propionate     Other reaction(s): dizziness  . Nsaids Other (See Comments)    Liver   . Other     Other reaction(s): diarrhea and headache Other reaction(s): Global anesthesia Other reaction(s): edema  . Penicillins Rash    Dry rash     Outpatient Medications Prior to Visit  Medication Sig Dispense Refill  . aspirin EC 81 MG tablet Take 81 mg by mouth daily. Swallow whole.    . Azelastine HCl 0.15 % SOLN Place 1 spray into both nostrils in the morning and at bedtime.     . Cholecalciferol (VITAMIN D) 50 MCG (2000 UT) CAPS Take 2,000 Units by mouth daily.    Marland Kitchen escitalopram (LEXAPRO) 10 MG tablet Take 10 mg by mouth daily.     Marland Kitchen lisinopril-hydrochlorothiazide (ZESTORETIC) 20-25 MG tablet Take 1 tablet by mouth daily.     . metoprolol succinate (TOPROL-XL) 25 MG 24 hr tablet Take 1 tablet (25 mg total) by mouth daily. Take with or immediately following a meal. 90 tablet 3  . montelukast (SINGULAIR) 10 MG tablet Take 10 mg by mouth daily.    . Multiple Vitamin (MULTIVITAMIN) tablet Take 1 tablet by mouth daily.    . ondansetron (ZOFRAN ODT) 8 MG  disintegrating tablet Take 1 tablet (8 mg total) by mouth every 8 (eight) hours as needed for nausea or vomiting. 30 tablet 0  . oxybutynin (DITROPAN-XL) 5 MG 24 hr tablet Take 5 mg by mouth at bedtime.     . potassium chloride (KLOR-CON) 10 MEQ tablet Take 1 tablet (10 mEq total) by mouth daily. 90 tablet 3  . albuterol (VENTOLIN HFA) 108 (90 Base) MCG/ACT inhaler Inhale 2 puffs into the lungs every 6 (six) hours as needed for wheezing or shortness of breath. (Patient not taking: No sig reported) 8 g 6  .  HYDROcodone-acetaminophen (HYCET) 7.5-325 mg/15 ml solution Take 15 mLs by mouth every 4 (four) hours as needed for moderate pain. (Patient not taking: No sig reported) 600 mL 0  . Tiotropium Bromide-Olodaterol (STIOLTO RESPIMAT) 2.5-2.5 MCG/ACT AERS Inhale 2 puffs into the lungs daily. (Patient not taking: No sig reported) 3 each 2   No facility-administered medications prior to visit.    Review of Systems  Constitutional: Negative for chills, fever, malaise/fatigue and weight loss.  HENT: Negative for hearing loss, sore throat and tinnitus.   Eyes: Negative for blurred vision and double vision.  Respiratory: Negative for cough, hemoptysis, sputum production, shortness of breath, wheezing and stridor.   Cardiovascular: Negative for chest pain, palpitations, orthopnea, leg swelling and PND.  Gastrointestinal: Negative for abdominal pain, constipation, diarrhea, heartburn, nausea and vomiting.  Genitourinary: Negative for dysuria, hematuria and urgency.  Musculoskeletal: Negative for joint pain and myalgias.  Skin: Negative for itching and rash.  Neurological: Negative for dizziness, tingling, weakness and headaches.  Endo/Heme/Allergies: Negative for environmental allergies. Does not bruise/bleed easily.  Psychiatric/Behavioral: Negative for depression. The patient is not nervous/anxious and does not have insomnia.   All other systems reviewed and are negative.    Objective:  Physical  Exam Vitals reviewed.  Constitutional:      General: She is not in acute distress.    Appearance: She is well-developed and well-nourished.  HENT:     Head: Normocephalic and atraumatic.     Mouth/Throat:     Mouth: Oropharynx is clear and moist.  Eyes:     General: No scleral icterus.    Conjunctiva/sclera: Conjunctivae normal.     Pupils: Pupils are equal, round, and reactive to light.  Neck:     Vascular: No JVD.     Trachea: No tracheal deviation.  Cardiovascular:     Rate and Rhythm: Normal rate and regular rhythm.     Pulses: Intact distal pulses.     Heart sounds: Normal heart sounds. No murmur heard.   Pulmonary:     Effort: Pulmonary effort is normal. No tachypnea, accessory muscle usage or respiratory distress.     Breath sounds: Normal breath sounds. No stridor. No wheezing, rhonchi or rales.  Abdominal:     General: Bowel sounds are normal. There is no distension.     Palpations: Abdomen is soft.     Tenderness: There is no abdominal tenderness.  Musculoskeletal:        General: No tenderness or edema.     Cervical back: Neck supple.  Lymphadenopathy:     Cervical: No cervical adenopathy.  Skin:    General: Skin is warm and dry.     Capillary Refill: Capillary refill takes less than 2 seconds.     Findings: No rash.  Neurological:     Mental Status: She is alert and oriented to person, place, and time.  Psychiatric:        Mood and Affect: Mood and affect normal.        Behavior: Behavior normal.      Vitals:   08/16/20 1156  BP: 120/80  Pulse: 82  Temp: 98.5 F (36.9 C)  TempSrc: Tympanic  SpO2: 99%  Weight: 148 lb 4 oz (67.2 kg)  Height: 5\' 5"  (1.651 m)   99% on  BMI Readings from Last 3 Encounters:  08/16/20 24.67 kg/m  08/06/20 24.63 kg/m  07/29/20 24.79 kg/m   Wt Readings from Last 3 Encounters:  08/16/20 148 lb 4 oz (67.2 kg)  08/06/20  148 lb (67.1 kg)  07/29/20 149 lb (67.6 kg)     CBC    Component Value Date/Time   WBC  12.3 (H) 07/30/2020 0051   RBC 4.10 07/30/2020 0051   HGB 11.5 (L) 07/30/2020 0051   HCT 37.7 07/30/2020 0051   PLT 199 07/30/2020 0051   MCV 92.0 07/30/2020 0051   MCH 28.0 07/30/2020 0051   MCHC 30.5 07/30/2020 0051   RDW 15.5 07/30/2020 0051    Chest Imaging: October 2020 CT chest: Right middle lobe groundglass opacity adjacent to the fissure line of the posterior, lower lobe.  CT scan of the chest August 2021: Persistent right middle lobe groundglass opacity and, now more solid in appearance.  Also has curved edge of lobulation. This is concerning for primary bronchogenic carcinoma. The patient's images have been independently reviewed by me.      Pulmonary Functions Testing Results: PFT Results Latest Ref Rng & Units 04/16/2020  FVC-Pre L 2.58  FVC-Predicted Pre % 84  FVC-Post L 2.48  FVC-Predicted Post % 81  Pre FEV1/FVC % % 74  Post FEV1/FCV % % 79  FEV1-Pre L 1.91  FEV1-Predicted Pre % 83  FEV1-Post L 1.96  DLCO uncorrected ml/min/mmHg 14.54  DLCO UNC% % 72  DLCO corrected ml/min/mmHg 14.54  DLCO COR %Predicted % 72  DLVA Predicted % 90    FeNO:   Pathology:   FINAL MICROSCOPIC DIAGNOSIS:   A. LUNG, RIGHT MIDDLE LOBE, LOBECTOMY:  - Adenocarcinoma, 0.6 cm.  - Margins not involved.  - Tumor involves subpleural connective tissue.  - Peribronchial lymph node with no metastatic carcinoma (0/1).   B. LYMPH NODE, HILAR, EXCISION:  - Anthracotic lymph node with no metastatic carcinoma (0/1).   C. LYMPH NODE, HILAR #2, EXCISION:  - Anthracotic lymph node with no metastatic carcinoma (0/1).   D. LYMPH NODE, HILAR #3, EXCISION:  - Anthracotic lymph node with no metastatic carcinoma (0/1).   E. LYMPH NODE, HILAR #4, EXCISION:  - Anthracotic lymph node with no metastatic carcinoma (0/1).   F. LYMPH NODE, 9, EXCISION:  - Anthracotic lymph node with no metastatic carcinoma (0/1).   G. LYMPH NODE, 7, EXCISION:  - Anthracotic lymph node with no metastatic  carcinoma (0/1).   H. LYMPH NODE, 7 #2, EXCISION:  - Anthracotic lymph node with no metastatic carcinoma (0/1).   Echocardiogram:   Heart Catheterization:      Assessment & Plan:     ICD-10-CM   1. Adenocarcinoma, lung, right (HCC)  C34.91 CT Chest Wo Contrast  2. Centrilobular emphysema ([facility])  J43.2   3. Former smoker  Z87.891   4. Malignant neoplasm of unspecified part of unspecified bronchus or lung ([facility]CC)  C34.90 CT Chest Wo Contrast  5. S/P lobectomy of lung  Z90.2 CT Chest Wo Contrast    Assessment:   75 year old female, persistent semisolid groundglass nodule of the right middle lobe status post lobectomy by Dr. Kipp Brood, positive diagnosis of adenocarcinoma of the right lung.  Patient doing well post resection.  She does have evidence of emphysema on CT.  Currently managed with as needed albuterol.  Also underwent hiatal hernia repair recently doing well from this.  Plan: 30-month noncontrast CT chest scheduled for April 2022. I will call her with the results of her 62-month postop film. She will also need a repeat image in October 2022.  This is also been ordered and she can see me after her 1 year. I will call her with the results of  the April CT. Continue routine follow-up with cardiothoracic surgery as scheduled.    Current Outpatient Medications:  .  aspirin EC 81 MG tablet, Take 81 mg by mouth daily. Swallow whole., Disp: , Rfl:  .  Azelastine HCl 0.15 % SOLN, Place 1 spray into both nostrils in the morning and at bedtime. , Disp: , Rfl:  .  Cholecalciferol (VITAMIN D) 50 MCG (2000 UT) CAPS, Take 2,000 Units by mouth daily., Disp: , Rfl:  .  escitalopram (LEXAPRO) 10 MG tablet, Take 10 mg by mouth daily. , Disp: , Rfl:  .  lisinopril-hydrochlorothiazide (ZESTORETIC) 20-25 MG tablet, Take 1 tablet by mouth daily. , Disp: , Rfl:  .  metoprolol succinate (TOPROL-XL) 25 MG 24 hr tablet, Take 1 tablet (25 mg total) by mouth daily. Take with or immediately following a  meal., Disp: 90 tablet, Rfl: 3 .  montelukast (SINGULAIR) 10 MG tablet, Take 10 mg by mouth daily., Disp: , Rfl:  .  Multiple Vitamin (MULTIVITAMIN) tablet, Take 1 tablet by mouth daily., Disp: , Rfl:  .  ondansetron (ZOFRAN ODT) 8 MG disintegrating tablet, Take 1 tablet (8 mg total) by mouth every 8 (eight) hours as needed for nausea or vomiting., Disp: 30 tablet, Rfl: 0 .  oxybutynin (DITROPAN-XL) 5 MG 24 hr tablet, Take 5 mg by mouth at bedtime. , Disp: , Rfl:  .  potassium chloride (KLOR-CON) 10 MEQ tablet, Take 1 tablet (10 mEq total) by mouth daily., Disp: 90 tablet, Rfl: 3 .  albuterol (VENTOLIN HFA) 108 (90 Base) MCG/ACT inhaler, Inhale 2 puffs into the lungs every 6 (six) hours as needed for wheezing or shortness of breath. (Patient not taking: No sig reported), Disp: 8 g, Rfl: 6 .  HYDROcodone-acetaminophen (HYCET) 7.5-325 mg/15 ml solution, Take 15 mLs by mouth every 4 (four) hours as needed for moderate pain. (Patient not taking: No sig reported), Disp: 600 mL, Rfl: 0 .  Tiotropium Bromide-Olodaterol (STIOLTO RESPIMAT) 2.5-2.5 MCG/ACT AERS, Inhale 2 puffs into the lungs daily. (Patient not taking: No sig reported), Disp: 3 each, Rfl: 2   Garner Nash, DO [facility] 08/16/2020 12:05 PM

## 2020-08-16 NOTE — Patient Instructions (Signed)
Thank you for visiting Dr. Valeta Harms at [facility]. Today we recommend the following: Orders Placed This Encounter  Procedures  . CT Chest Wo Contrast   CT Chest follow up in April 2022 and in October 2022.  I will call you with the results of the April 6 month post op ct chest   Return in about 9 months (around 05/16/2021) for with APP or Dr. Valeta Harms.    Please do your part to reduce the spread of COVID-19.

## 2020-08-24 DIAGNOSIS — K219 Gastro-esophageal reflux disease without esophagitis: Secondary | ICD-10-CM | POA: Diagnosis not present

## 2020-08-24 DIAGNOSIS — M8580 Other specified disorders of bone density and structure, unspecified site: Secondary | ICD-10-CM | POA: Diagnosis not present

## 2020-08-24 DIAGNOSIS — N189 Chronic kidney disease, unspecified: Secondary | ICD-10-CM | POA: Diagnosis not present

## 2020-08-24 DIAGNOSIS — D649 Anemia, unspecified: Secondary | ICD-10-CM | POA: Diagnosis not present

## 2020-08-24 DIAGNOSIS — Z85118 Personal history of other malignant neoplasm of bronchus and lung: Secondary | ICD-10-CM | POA: Diagnosis not present

## 2020-08-24 DIAGNOSIS — M810 Age-related osteoporosis without current pathological fracture: Secondary | ICD-10-CM | POA: Diagnosis not present

## 2020-08-24 DIAGNOSIS — D509 Iron deficiency anemia, unspecified: Secondary | ICD-10-CM | POA: Diagnosis not present

## 2020-08-24 DIAGNOSIS — I10 Essential (primary) hypertension: Secondary | ICD-10-CM | POA: Diagnosis not present

## 2020-09-02 ENCOUNTER — Other Ambulatory Visit: Payer: Self-pay | Admitting: Thoracic Surgery (Cardiothoracic Vascular Surgery)

## 2020-09-02 DIAGNOSIS — R911 Solitary pulmonary nodule: Secondary | ICD-10-CM

## 2020-09-03 ENCOUNTER — Ambulatory Visit (INDEPENDENT_AMBULATORY_CARE_PROVIDER_SITE_OTHER): Payer: Self-pay | Admitting: Thoracic Surgery (Cardiothoracic Vascular Surgery)

## 2020-09-03 ENCOUNTER — Encounter: Payer: Self-pay | Admitting: Thoracic Surgery (Cardiothoracic Vascular Surgery)

## 2020-09-03 ENCOUNTER — Other Ambulatory Visit: Payer: Self-pay

## 2020-09-03 ENCOUNTER — Ambulatory Visit
Admission: RE | Admit: 2020-09-03 | Discharge: 2020-09-03 | Disposition: A | Discharge status: Home / Self Care | Payer: Medicare Other | Source: Ambulatory Visit | Attending: Thoracic Surgery (Cardiothoracic Vascular Surgery) | Admitting: Thoracic Surgery (Cardiothoracic Vascular Surgery)

## 2020-09-03 VITALS — BP 118/77 | HR 100 | Temp 97.6°F | Resp 20 | Ht 65.0 in | Wt 149.0 lb

## 2020-09-03 DIAGNOSIS — Z8719 Personal history of other diseases of the digestive system: Secondary | ICD-10-CM

## 2020-09-03 DIAGNOSIS — K449 Diaphragmatic hernia without obstruction or gangrene: Secondary | ICD-10-CM | POA: Diagnosis not present

## 2020-09-03 DIAGNOSIS — R911 Solitary pulmonary nodule: Secondary | ICD-10-CM

## 2020-09-03 DIAGNOSIS — Z9889 Other specified postprocedural states: Secondary | ICD-10-CM

## 2020-09-03 DIAGNOSIS — Z85118 Personal history of other malignant neoplasm of bronchus and lung: Secondary | ICD-10-CM | POA: Diagnosis not present

## 2020-09-03 NOTE — Progress Notes (Signed)
      [address]       [place] [address]             [phone]        Marsela P Limb [place] Medical Record #[id number] Date of Birth: 1946/05/22  Referring: Lorretta Harp, MD Primary Care: Hulan Fess, MD Primary Cardiologist:No primary care provider on file.  Reason for visit:   follow-up  History of Present Illness:     Ms. Grajeda comes in for her 1 month follow-up appointment from her paraesophageal hernia repair.  Overall she is doing quite well.  She is back on a regular diet and has no complaints.  She also denies any reflux.  Her tachycardia is also improved  Physical Exam: BP 118/77   Pulse 100   Temp 97.6 F (36.4 C) (Skin)   Resp 20   Ht 5\' 5"  (1.651 m)   Wt 149 lb (67.6 kg)   SpO2 98% Comment: RA  BMI 24.79 kg/m   Alert NAD Incision well-healed.   Abdomen soft, ND No peripheral edema   Diagnostic Studies & Laboratory data: CXR: clear     Assessment / Plan:   75 yo female with hx of Stage I adenocarcinoma of the RML, now s/p paraesophageal hernia repair presents in follow-up.  She is scheduled to get her surveillance CT chest in April, and follow-up with Dr. Valeta Harms.  I will see her back in 6 months to assess her swallowing function.   Lajuana Matte 09/03/2020 1:18 PM

## 2020-09-30 DIAGNOSIS — K219 Gastro-esophageal reflux disease without esophagitis: Secondary | ICD-10-CM | POA: Diagnosis not present

## 2020-09-30 DIAGNOSIS — I10 Essential (primary) hypertension: Secondary | ICD-10-CM | POA: Diagnosis not present

## 2020-09-30 DIAGNOSIS — D509 Iron deficiency anemia, unspecified: Secondary | ICD-10-CM | POA: Diagnosis not present

## 2020-09-30 DIAGNOSIS — Z85118 Personal history of other malignant neoplasm of bronchus and lung: Secondary | ICD-10-CM | POA: Diagnosis not present

## 2020-09-30 DIAGNOSIS — M8580 Other specified disorders of bone density and structure, unspecified site: Secondary | ICD-10-CM | POA: Diagnosis not present

## 2020-09-30 DIAGNOSIS — M810 Age-related osteoporosis without current pathological fracture: Secondary | ICD-10-CM | POA: Diagnosis not present

## 2020-09-30 DIAGNOSIS — D649 Anemia, unspecified: Secondary | ICD-10-CM | POA: Diagnosis not present

## 2020-09-30 DIAGNOSIS — N189 Chronic kidney disease, unspecified: Secondary | ICD-10-CM | POA: Diagnosis not present

## 2020-10-04 ENCOUNTER — Other Ambulatory Visit: Payer: Self-pay

## 2020-10-04 ENCOUNTER — Ambulatory Visit
Admission: RE | Admit: 2020-10-04 | Discharge: 2020-10-04 | Disposition: A | Discharge status: Home / Self Care | Payer: Medicare Other | Source: Ambulatory Visit | Attending: Pulmonary Disease | Admitting: Pulmonary Disease

## 2020-10-04 DIAGNOSIS — C3490 Malignant neoplasm of unspecified part of unspecified bronchus or lung: Secondary | ICD-10-CM

## 2020-10-04 DIAGNOSIS — C3491 Malignant neoplasm of unspecified part of right bronchus or lung: Secondary | ICD-10-CM

## 2020-10-04 DIAGNOSIS — Z902 Acquired absence of lung [part of]: Secondary | ICD-10-CM

## 2020-10-20 ENCOUNTER — Ambulatory Visit: Payer: Medicare Other | Admitting: Cardiovascular Disease

## 2020-10-20 ENCOUNTER — Other Ambulatory Visit: Payer: Self-pay

## 2020-10-20 ENCOUNTER — Encounter: Payer: Self-pay | Admitting: Cardiovascular Disease

## 2020-10-20 DIAGNOSIS — I10 Essential (primary) hypertension: Secondary | ICD-10-CM | POA: Diagnosis not present

## 2020-10-20 DIAGNOSIS — R000 Tachycardia, unspecified: Secondary | ICD-10-CM | POA: Diagnosis not present

## 2020-10-20 NOTE — Assessment & Plan Note (Signed)
History of sinus tachycardia with PACs improved with the addition of beta-blocker.

## 2020-10-20 NOTE — Assessment & Plan Note (Signed)
History of essential hypertension blood pressure measured today 118/78.  She is on metoprolol, lisinopril and hydrochlorothiazide.

## 2020-10-20 NOTE — Patient Instructions (Signed)

## 2020-10-20 NOTE — Progress Notes (Signed)
10/20/2020 Kimberly Beck   [birth year]  [id number]  Primary Physician Hulan Fess, MD Primary Cardiologist: Lorretta Harp MD FACP, [place], [place], [place]  HPI:  Kimberly Beck is a 75 y.o.   mildly overweight separated Caucasian female with no children referred by Dr. Kipp Brood for preoperative clearance before robotic paraesophageal hernia repair of a large hiatal hernia.    I last saw her in the office 07/14/2020.  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  She smoked remotely and stopped in 1987.  She has treated hypertension.  Her father did die of a myocardial infarction at age 76.  She is never had a heart or stroke.  She denies chest pain or shortness of breath.  She had resting tachycardia for an unknown known period of time although she is unaware of this.  TSH is normal.  This was reviewed by Roderic Palau, FNP for concern that it was A. fib but it was sinus tach with PACs.  Since I saw her 3 months ago she did undergo successful robotic para esophageal hernia repair 07/29/2020 by Dr. Kipp Brood.  She feels clinically improved.  Her heart rate has been better controlled on beta-blocker.  She denies chest pain or shortness of breath.    Current Meds  Medication Sig  . albuterol (VENTOLIN HFA) 108 (90 Base) MCG/ACT inhaler Inhale 2 puffs into the lungs every 6 (six) hours as needed for wheezing or shortness of breath.  Marland Kitchen aspirin EC 81 MG tablet Take 81 mg by mouth daily. Swallow whole.  . Azelastine HCl 0.15 % SOLN Place 1 spray into both nostrils in the morning and at bedtime.   . Cholecalciferol (VITAMIN D) 50 MCG (2000 UT) CAPS Take 2,000 Units by mouth daily.  Marland Kitchen escitalopram (LEXAPRO) 10 MG tablet Take 10 mg by mouth daily.   Marland Kitchen lisinopril-hydrochlorothiazide (ZESTORETIC) 20-25 MG tablet Take 1 tablet by mouth daily.   . metoprolol succinate (TOPROL-XL) 25 MG 24 hr tablet Take 1 tablet (25 mg total) by mouth daily. Take with or immediately following  a meal.  . montelukast (SINGULAIR) 10 MG tablet Take 10 mg by mouth daily.  . Multiple Vitamin (MULTIVITAMIN) tablet Take 1 tablet by mouth daily.  Marland Kitchen oxybutynin (DITROPAN-XL) 5 MG 24 hr tablet Take 5 mg by mouth at bedtime.   . potassium chloride (KLOR-CON) 10 MEQ tablet Take 1 tablet (10 mEq total) by mouth daily.     Allergies  Allergen Reactions  . Codeine Itching    Drunk feeling   . Fluticasone Propionate     Other reaction(s): dizziness  . Nsaids Other (See Comments)    Liver   . Other     Other reaction(s): diarrhea and headache Other reaction(s): Global anesthesia Other reaction(s): edema  . Penicillins Rash    Dry rash    Social History   Socioeconomic History  . Marital status: Single    Spouse name: Not on file  . Number of children: Not on file  . Years of education: Not on file  . Highest education level: Not on file  Occupational History  . Not on file  Tobacco Use  . Smoking status: Former Smoker    Packs/day: 3.00    Years: 25.00    Pack years: 75.00    Start date: 1962    Quit date: 1987    Years since quitting: 35.3  . Smokeless tobacco: Never Used  Vaping Use  . Vaping Use:  Never used  Substance and Sexual Activity  . Alcohol use: Never  . Drug use: Never  . Sexual activity: Not on file  Other Topics Concern  . Not on file  Social History Narrative  . Not on file   Social Determinants of Health   Financial Resource Strain: Not on file  Food Insecurity: Not on file  Transportation Needs: Not on file  Physical Activity: Not on file  Stress: Not on file  Social Connections: Not on file  Intimate Partner Violence: Not on file     Review of Systems: General: negative for chills, fever, night sweats or weight changes.  Cardiovascular: negative for chest pain, dyspnea on exertion, edema, orthopnea, palpitations, paroxysmal nocturnal dyspnea or shortness of breath Dermatological: negative for rash Respiratory: negative for cough or  wheezing Urologic: negative for hematuria Abdominal: negative for nausea, vomiting, diarrhea, bright red blood per rectum, melena, or hematemesis Neurologic: negative for visual changes, syncope, or dizziness All other systems reviewed and are otherwise negative except as noted above.    Blood pressure 118/78, pulse (!) 56, height 5\' 5"  (1.651 m), weight 157 lb 12.8 oz (71.6 kg), SpO2 97 %.  General appearance: alert and no distress Neck: no adenopathy, no carotid bruit, no JVD, supple, symmetrical, trachea midline and thyroid not enlarged, symmetric, no tenderness/mass/nodules Lungs: clear to auscultation bilaterally Heart: regular rate and rhythm, S1, S2 normal, no murmur, click, rub or gallop Extremities: extremities normal, atraumatic, no cyanosis or edema Pulses: 2+ and symmetric Skin: Skin color, texture, turgor normal. No rashes or lesions Neurologic: Alert and oriented X 3, normal strength and tone. Normal symmetric reflexes. Normal coordination and gait  EKG sinus bradycardia 56 without ST or T wave changes.  Personally reviewed this EKG.  ASSESSMENT AND PLAN:   Essential hypertension History of essential hypertension blood pressure measured today 118/78.  She is on metoprolol, lisinopril and hydrochlorothiazide.  Sinus tachycardia History of sinus tachycardia with PACs improved with the addition of beta-blocker.      Lorretta Harp MD FACP,FACC,FAHA, [facility] 10/20/2020 12:01 PM

## 2020-11-04 ENCOUNTER — Encounter: Payer: Self-pay | Admitting: *Deleted

## 2020-11-04 NOTE — Progress Notes (Signed)
Leigh, you can let her know that her CT scan is stable.  No evidence of recurrence of disease.  Follow up with Dr. Kipp Brood and thoracic surgery office.  I believe this is already scheduled  Thanks,  BLI  Garner Nash, DO [facility] 11/04/2020 7:14 PM

## 2020-11-17 DIAGNOSIS — Z85118 Personal history of other malignant neoplasm of bronchus and lung: Secondary | ICD-10-CM | POA: Diagnosis not present

## 2020-11-17 DIAGNOSIS — I10 Essential (primary) hypertension: Secondary | ICD-10-CM | POA: Diagnosis not present

## 2020-11-17 DIAGNOSIS — M8580 Other specified disorders of bone density and structure, unspecified site: Secondary | ICD-10-CM | POA: Diagnosis not present

## 2020-11-17 DIAGNOSIS — D649 Anemia, unspecified: Secondary | ICD-10-CM | POA: Diagnosis not present

## 2020-11-17 DIAGNOSIS — K219 Gastro-esophageal reflux disease without esophagitis: Secondary | ICD-10-CM | POA: Diagnosis not present

## 2020-11-17 DIAGNOSIS — M810 Age-related osteoporosis without current pathological fracture: Secondary | ICD-10-CM | POA: Diagnosis not present

## 2020-11-17 DIAGNOSIS — D509 Iron deficiency anemia, unspecified: Secondary | ICD-10-CM | POA: Diagnosis not present

## 2020-11-17 DIAGNOSIS — N189 Chronic kidney disease, unspecified: Secondary | ICD-10-CM | POA: Diagnosis not present

## 2021-01-19 DIAGNOSIS — M810 Age-related osteoporosis without current pathological fracture: Secondary | ICD-10-CM | POA: Diagnosis not present

## 2021-01-19 DIAGNOSIS — N189 Chronic kidney disease, unspecified: Secondary | ICD-10-CM | POA: Diagnosis not present

## 2021-01-19 DIAGNOSIS — M8580 Other specified disorders of bone density and structure, unspecified site: Secondary | ICD-10-CM | POA: Diagnosis not present

## 2021-01-19 DIAGNOSIS — D649 Anemia, unspecified: Secondary | ICD-10-CM | POA: Diagnosis not present

## 2021-01-19 DIAGNOSIS — D509 Iron deficiency anemia, unspecified: Secondary | ICD-10-CM | POA: Diagnosis not present

## 2021-01-19 DIAGNOSIS — I10 Essential (primary) hypertension: Secondary | ICD-10-CM | POA: Diagnosis not present

## 2021-01-19 DIAGNOSIS — K219 Gastro-esophageal reflux disease without esophagitis: Secondary | ICD-10-CM | POA: Diagnosis not present

## 2021-02-01 ENCOUNTER — Other Ambulatory Visit: Payer: Self-pay | Admitting: Family Medicine

## 2021-02-01 DIAGNOSIS — Z1231 Encounter for screening mammogram for malignant neoplasm of breast: Secondary | ICD-10-CM

## 2021-03-01 DIAGNOSIS — K219 Gastro-esophageal reflux disease without esophagitis: Secondary | ICD-10-CM | POA: Diagnosis not present

## 2021-03-01 DIAGNOSIS — M8580 Other specified disorders of bone density and structure, unspecified site: Secondary | ICD-10-CM | POA: Diagnosis not present

## 2021-03-01 DIAGNOSIS — M810 Age-related osteoporosis without current pathological fracture: Secondary | ICD-10-CM | POA: Diagnosis not present

## 2021-03-01 DIAGNOSIS — N189 Chronic kidney disease, unspecified: Secondary | ICD-10-CM | POA: Diagnosis not present

## 2021-03-01 DIAGNOSIS — D649 Anemia, unspecified: Secondary | ICD-10-CM | POA: Diagnosis not present

## 2021-03-01 DIAGNOSIS — I10 Essential (primary) hypertension: Secondary | ICD-10-CM | POA: Diagnosis not present

## 2021-03-01 DIAGNOSIS — D509 Iron deficiency anemia, unspecified: Secondary | ICD-10-CM | POA: Diagnosis not present

## 2021-03-02 DIAGNOSIS — J309 Allergic rhinitis, unspecified: Secondary | ICD-10-CM | POA: Diagnosis not present

## 2021-03-02 DIAGNOSIS — M810 Age-related osteoporosis without current pathological fracture: Secondary | ICD-10-CM | POA: Diagnosis not present

## 2021-03-02 DIAGNOSIS — E559 Vitamin D deficiency, unspecified: Secondary | ICD-10-CM | POA: Diagnosis not present

## 2021-03-02 DIAGNOSIS — I10 Essential (primary) hypertension: Secondary | ICD-10-CM | POA: Diagnosis not present

## 2021-03-02 DIAGNOSIS — Z0000 Encounter for general adult medical examination without abnormal findings: Secondary | ICD-10-CM | POA: Diagnosis not present

## 2021-03-02 DIAGNOSIS — N189 Chronic kidney disease, unspecified: Secondary | ICD-10-CM | POA: Diagnosis not present

## 2021-03-02 DIAGNOSIS — D649 Anemia, unspecified: Secondary | ICD-10-CM | POA: Diagnosis not present

## 2021-03-02 DIAGNOSIS — R7301 Impaired fasting glucose: Secondary | ICD-10-CM | POA: Diagnosis not present

## 2021-03-02 DIAGNOSIS — Z13220 Encounter for screening for lipoid disorders: Secondary | ICD-10-CM | POA: Diagnosis not present

## 2021-03-02 DIAGNOSIS — Z136 Encounter for screening for cardiovascular disorders: Secondary | ICD-10-CM | POA: Diagnosis not present

## 2021-03-03 ENCOUNTER — Other Ambulatory Visit: Payer: Self-pay | Admitting: Physician Assistant

## 2021-03-11 ENCOUNTER — Telehealth (INDEPENDENT_AMBULATORY_CARE_PROVIDER_SITE_OTHER): Payer: Medicare Other | Admitting: Thoracic Surgery (Cardiothoracic Vascular Surgery)

## 2021-03-11 ENCOUNTER — Other Ambulatory Visit: Payer: Self-pay

## 2021-03-11 DIAGNOSIS — K449 Diaphragmatic hernia without obstruction or gangrene: Secondary | ICD-10-CM | POA: Diagnosis not present

## 2021-03-11 MED ORDER — PANTOPRAZOLE SODIUM 40 MG PO TBEC: 40.0000 mg | DELAYED_RELEASE_TABLET | Freq: Every day | ORAL | 0 refills | Status: DC

## 2021-03-11 NOTE — Progress Notes (Signed)
     [address]       [place] [address]             [phone]       Patient: Home Provider: Office Consent for Telemedicine visit obtained.  Today's visit was completed via a real-time telehealth (see specific modality noted below). The patient/authorized person provided oral consent at the time of the visit to engage in a telemedicine encounter with the present provider at [facility]. The patient/authorized person was informed of the potential benefits, limitations, and risks of telemedicine. The patient/authorized person expressed understanding that the laws that protect confidentiality also apply to telemedicine. The patient/authorized person acknowledged understanding that telemedicine does not provide emergency services and that he or she would need to call 911 or proceed to the nearest hospital for help if such a need arose.   Total time spent in the clinical discussion 10 minutes.  Telehealth Modality: Phone visit (audio only)  I had a telephone visit with Mrs. Kimberly Beck.  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  And overall she is doing well however she has developed reflux over the last month.  This is resolved with some Tums.  She denies any issues from a respiratory standpoint.  She saw Dr. Valeta Harms back in April of this year for surveillance.  There is no evidence of any recurrence.  I have ordered an esophagram to assess repair.  I will see her in follow-up after this is performed.  I will also see her back in 6 months with a CT scan chest for ongoing surveillance.

## 2021-03-14 ENCOUNTER — Other Ambulatory Visit: Payer: Self-pay | Admitting: Thoracic Surgery (Cardiothoracic Vascular Surgery)

## 2021-03-14 DIAGNOSIS — K449 Diaphragmatic hernia without obstruction or gangrene: Secondary | ICD-10-CM

## 2021-03-18 ENCOUNTER — Ambulatory Visit
Admission: RE | Admit: 2021-03-18 | Discharge: 2021-03-18 | Disposition: A | Discharge status: Home / Self Care | Payer: Medicare Other | Source: Ambulatory Visit | Attending: Thoracic Surgery (Cardiothoracic Vascular Surgery) | Admitting: Thoracic Surgery (Cardiothoracic Vascular Surgery)

## 2021-03-18 ENCOUNTER — Other Ambulatory Visit: Payer: Self-pay | Admitting: Thoracic Surgery (Cardiothoracic Vascular Surgery)

## 2021-03-18 DIAGNOSIS — K449 Diaphragmatic hernia without obstruction or gangrene: Secondary | ICD-10-CM

## 2021-03-18 DIAGNOSIS — K219 Gastro-esophageal reflux disease without esophagitis: Secondary | ICD-10-CM | POA: Diagnosis not present

## 2021-03-23 ENCOUNTER — Ambulatory Visit
Admission: RE | Admit: 2021-03-23 | Discharge: 2021-03-23 | Disposition: A | Discharge status: Home / Self Care | Payer: Medicare Other | Source: Ambulatory Visit | Attending: Family Medicine | Admitting: Family Medicine

## 2021-03-23 ENCOUNTER — Other Ambulatory Visit: Payer: Self-pay

## 2021-03-23 DIAGNOSIS — Z1231 Encounter for screening mammogram for malignant neoplasm of breast: Secondary | ICD-10-CM

## 2021-03-24 ENCOUNTER — Ambulatory Visit (INDEPENDENT_AMBULATORY_CARE_PROVIDER_SITE_OTHER): Payer: Medicare Other | Admitting: Thoracic Surgery (Cardiothoracic Vascular Surgery)

## 2021-03-24 DIAGNOSIS — Z8719 Personal history of other diseases of the digestive system: Secondary | ICD-10-CM | POA: Diagnosis not present

## 2021-03-24 DIAGNOSIS — K219 Gastro-esophageal reflux disease without esophagitis: Secondary | ICD-10-CM

## 2021-03-24 DIAGNOSIS — Z9889 Other specified postprocedural states: Secondary | ICD-10-CM | POA: Diagnosis not present

## 2021-03-24 MED ORDER — PANTOPRAZOLE SODIUM 40 MG PO TBEC: 40.0000 mg | DELAYED_RELEASE_TABLET | Freq: Every day | ORAL | 6 refills | Status: DC

## 2021-03-24 NOTE — Progress Notes (Signed)
     [address]       [place] [address]             [phone]       Patient: Home Provider: Office Consent for Telemedicine visit obtained.  Today's visit was completed via a real-time telehealth (see specific modality noted below). The patient/authorized person provided oral consent at the time of the visit to engage in a telemedicine encounter with the present provider at [facility]. The patient/authorized person was informed of the potential benefits, limitations, and risks of telemedicine. The patient/authorized person expressed understanding that the laws that protect confidentiality also apply to telemedicine. The patient/authorized person acknowledged understanding that telemedicine does not provide emergency services and that he or she would need to call 911 or proceed to the nearest hospital for help if such a need arose.   Total time spent in the clinical discussion 10 minutes.  Telehealth Modality: Phone visit (audio only)  I had a telephone visit with Mr. Brocks.  She underwent paraesophageal hernia in April 2022 as well as a right middle lobectomy in October 2021.  She has developed a small recurrence, which is evident to mold swallow study from this month.  She does have some reflux symptoms.  This is controlled with the Protonix.  She is only had 1 bout of dysphagia while eating a hot dog.  We discussed the possibility of redo hiatal hernia surgery repair but she would like to wait.  I will see her back in 77months for ongoing surveillance of her lung cancer.  At that time we will discuss options for surgical repair of her hiatal hernia.

## 2021-03-25 ENCOUNTER — Telehealth: Payer: Medicare Other | Admitting: Thoracic Surgery (Cardiothoracic Vascular Surgery)

## 2021-04-04 ENCOUNTER — Other Ambulatory Visit: Payer: Self-pay | Admitting: Physician Assistant

## 2021-04-15 ENCOUNTER — Other Ambulatory Visit: Payer: Self-pay

## 2021-04-15 ENCOUNTER — Ambulatory Visit
Admission: RE | Admit: 2021-04-15 | Discharge: 2021-04-15 | Disposition: A | Discharge status: Home / Self Care | Payer: Medicare Other | Source: Ambulatory Visit | Attending: Pulmonary Disease | Admitting: Pulmonary Disease

## 2021-04-15 DIAGNOSIS — C3490 Malignant neoplasm of unspecified part of unspecified bronchus or lung: Secondary | ICD-10-CM | POA: Diagnosis not present

## 2021-04-15 DIAGNOSIS — J9811 Atelectasis: Secondary | ICD-10-CM | POA: Diagnosis not present

## 2021-04-15 DIAGNOSIS — I700 Atherosclerosis of aorta: Secondary | ICD-10-CM | POA: Diagnosis not present

## 2021-05-16 ENCOUNTER — Encounter: Payer: Self-pay | Admitting: Pulmonary Disease

## 2021-05-16 ENCOUNTER — Other Ambulatory Visit: Payer: Self-pay

## 2021-05-16 ENCOUNTER — Ambulatory Visit: Payer: Medicare Other | Admitting: Pulmonary Disease

## 2021-05-16 VITALS — BP 130/72 | HR 101 | Temp 98.0°F | Ht 65.0 in | Wt 166.0 lb

## 2021-05-16 DIAGNOSIS — Z23 Encounter for immunization: Secondary | ICD-10-CM | POA: Diagnosis not present

## 2021-05-16 DIAGNOSIS — C3490 Malignant neoplasm of unspecified part of unspecified bronchus or lung: Secondary | ICD-10-CM

## 2021-05-16 DIAGNOSIS — C3491 Malignant neoplasm of unspecified part of right bronchus or lung: Secondary | ICD-10-CM | POA: Diagnosis not present

## 2021-05-16 DIAGNOSIS — J432 Centrilobular emphysema: Secondary | ICD-10-CM

## 2021-05-16 DIAGNOSIS — Z902 Acquired absence of lung [part of]: Secondary | ICD-10-CM | POA: Diagnosis not present

## 2021-05-16 DIAGNOSIS — Z87891 Personal history of nicotine dependence: Secondary | ICD-10-CM | POA: Diagnosis not present

## 2021-05-16 DIAGNOSIS — I2584 Coronary atherosclerosis due to calcified coronary lesion: Secondary | ICD-10-CM

## 2021-05-16 DIAGNOSIS — I2510 Atherosclerotic heart disease of native coronary artery without angina pectoris: Secondary | ICD-10-CM | POA: Diagnosis not present

## 2021-05-16 NOTE — Progress Notes (Signed)
Synopsis: Referred in Oct 2021 for lung nodule PCP by Hulan Fess, MD  Subjective:   PATIENT ID: Kimberly Beck GENDER: female DOB: Jan 19, 1946, MRN: [id number]  Chief Complaint  Patient presents with   Follow-up    Follow up after CT    75 yo FM PMH HTN, DVT, RML GGO, with a semi-solid component concerning for malignancy, former smoker quit in 1987, 34 pack year history.  Patient is able to complete her activities of daily living.  She is not on any breathing treatments for respiratory complaints.  She has not had pulmonary function test in the past.  Here today for follow-up regarding CT scan imaging.  Initial imaging was in October 2020.  She had follow-up images in August 2021 which showed a right middle lobe groundglass nodule with a semisolid component that has more formed organization concerning for potential primary malignancy of the lung.  Patient denies fevers chills night sweats weight loss.  Or hemoptysis.  OV 05/20/2020: Patient seen today for follow-up after recent robotic lobectomy by Dr. Kipp Brood.  Patient was diagnosed with a stage T1 a adenocarcinoma.  Patient did well with surgery.  She still has some breathlessness associated with her exertional activity.  She is currently not on any inhalers.  She feels like she is slowly recovering back to her baseline.  She had follow-up with Dr. Kipp Brood on 05/07/2020.  Office note reviewed.  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  Case was discussed in medical thoracic oncology tumor board and no adjuvant chemotherapy is necessary.  She has a 1 month follow-up scheduled with Dr. Kipp Brood.  OV 08/16/2020: recent hiatal hernia repair by Dr. Kipp Brood. Doing well post-op from that.  Patient from a respiratory standpoint is doing well.  She has recovered nicely from both procedures.  She has her 76-month noncontrasted CT follow-up in April 2022 which will be 6 months from her  surgical lobectomy.  Patient denies fever chills night sweats weight loss.  She has not been using her inhalers.  As needed use of albuterol still.  She felt like the Stiolto made her cough and she did not notice much difference in her breathing.  OV 05/16/2021: Here today for follow-up for lung cancer.Patient had a CT scan of the chest in October 2022.  CT 04/15/2021 revealed no evidence of recurrent or metastatic disease stable postsurgical changes following a right middle lobectomy.  CT scan of the chest was reviewed today in the office with patient.  From respiratory standpoint she is doing well.  She does have some questions regarding her CT scan findings.  She has a few tree-in-bud opacities and we discussed the concept of chronic NTM which can be seen in the lung.  We also discussed coronary calcifications that were identified on CT imaging.  She plans to have follow-up scheduled with Dr. Gwenlyn Found her cardiologist.   Past Medical History:  Diagnosis Date   Allergy    Anemia    Cancer ([place])    Complication of anesthesia    Depression    DVT, lower extremity, recurrent, right ([place])    History of hiatal hernia    HTN (hypertension)    Lung cancer ([place])    Right   Pneumonia    PONV (postoperative nausea and vomiting)    N/V only after receiving ether.   Pre-diabetes    BS checks daily, takes no medicines     Family History  Problem  Relation Age of Onset   Heart attack Father    Stroke Maternal Grandfather      Past Surgical History:  Procedure Laterality Date   ESOPHAGOGASTRODUODENOSCOPY N/A 07/29/2020   Procedure: ESOPHAGOGASTRODUODENOSCOPY (EGD);  Surgeon: Lajuana Matte, MD;  Location: [facility];  Service: Thoracic;  Laterality: N/A;   EYE SURGERY     Bilateral cataract removal   INTERCOSTAL NERVE BLOCK Right 04/29/2020   Procedure: INTERCOSTAL NERVE BLOCK;  Surgeon: Lajuana Matte, MD;  Location: [facility];  Service: Thoracic;  Laterality: Right;   LYMPH NODE DISSECTION  Right 04/29/2020   Procedure: LYMPH NODE DISSECTION;  Surgeon: Lajuana Matte, MD;  Location: [facility];  Service: Thoracic;  Laterality: Right;   TONSILLECTOMY     TONSILLECTOMY AND ADENOIDECTOMY     TUBAL LIGATION     XI ROBOTIC ASSISTED PARAESOPHAGEAL HERNIA REPAIR N/A 07/29/2020   Procedure: XI ROBOTIC ASSISTED PARAESOPHAGEAL HERNIA REPAIR;  Surgeon: Lajuana Matte, MD;  Location: [facility];  Service: Thoracic;  Laterality: N/A;    Social History   Socioeconomic History   Marital status: Single    Spouse name: Not on file   Number of children: Not on file   Years of education: Not on file   Highest education level: Not on file  Occupational History   Not on file  Tobacco Use   Smoking status: Former    Packs/day: 3.00    Years: 25.00    Pack years: 75.00    Types: Cigarettes    Start date: 74    Quit date: 1987    Years since quitting: 35.8   Smokeless tobacco: Never  Vaping Use   Vaping Use: Never used  Substance and Sexual Activity   Alcohol use: Never   Drug use: Never   Sexual activity: Not on file  Other Topics Concern   Not on file  Social History Narrative   Not on file   Social Determinants of Health   Financial Resource Strain: Not on file  Food Insecurity: Not on file  Transportation Needs: Not on file  Physical Activity: Not on file  Stress: Not on file  Social Connections: Not on file  Intimate Partner Violence: Not on file     Allergies  Allergen Reactions   Codeine Itching    Drunk feeling    Nsaids Other (See Comments)    Liver    Other     Other reaction(s): diarrhea and headache Other reaction(s): Global anesthesia Other reaction(s): edema   Penicillins Rash    Dry rash     Outpatient Medications Prior to Visit  Medication Sig Dispense Refill   albuterol (VENTOLIN HFA) 108 (90 Base) MCG/ACT inhaler Inhale 2 puffs into the lungs every 6 (six) hours as needed for wheezing or shortness of breath. 8 g 6   aspirin EC 81 MG  tablet Take 81 mg by mouth daily. Swallow whole.     Azelastine HCl 0.15 % SOLN Place 1 spray into both nostrils in the morning and at bedtime.      Cholecalciferol (VITAMIN D) 50 MCG (2000 UT) CAPS Take 2,000 Units by mouth daily.     escitalopram (LEXAPRO) 10 MG tablet Take 10 mg by mouth daily.      lisinopril-hydrochlorothiazide (ZESTORETIC) 20-25 MG tablet Take 1 tablet by mouth daily.      montelukast (SINGULAIR) 10 MG tablet Take 10 mg by mouth daily.     Multiple Vitamin (MULTIVITAMIN) tablet Take 1 tablet by mouth  daily.     pantoprazole (PROTONIX) 40 MG tablet Take 1 tablet (40 mg total) by mouth daily. 30 tablet 6   potassium chloride (KLOR-CON) 10 MEQ tablet Take 1 tablet (10 mEq total) by mouth daily. 90 tablet 3   metoprolol succinate (TOPROL-XL) 25 MG 24 hr tablet Take 1 tablet (25 mg total) by mouth daily. Take with or immediately following a meal. 90 tablet 3   oxybutynin (DITROPAN-XL) 5 MG 24 hr tablet Take 5 mg by mouth at bedtime.  (Patient not taking: Reported on 05/16/2021)     No facility-administered medications prior to visit.    Review of Systems  Constitutional:  Negative for chills, fever, malaise/fatigue and weight loss.  HENT:  Negative for hearing loss, sore throat and tinnitus.   Eyes:  Negative for blurred vision and double vision.  Respiratory:  Negative for cough, hemoptysis, sputum production, shortness of breath, wheezing and stridor.   Cardiovascular:  Positive for chest pain (occasion at night). Negative for palpitations, orthopnea, leg swelling and PND.  Gastrointestinal:  Negative for abdominal pain, constipation, diarrhea, heartburn, nausea and vomiting.  Genitourinary:  Negative for dysuria, hematuria and urgency.  Musculoskeletal:  Negative for joint pain and myalgias.  Skin:  Negative for itching and rash.  Neurological:  Negative for dizziness, tingling, weakness and headaches.  Endo/Heme/Allergies:  Negative for environmental allergies. Does  not bruise/bleed easily.  Psychiatric/Behavioral:  Negative for depression. The patient is not nervous/anxious and does not have insomnia.   All other systems reviewed and are negative.   Objective:  Physical Exam Vitals reviewed.  Constitutional:      General: She is not in acute distress.    Appearance: She is well-developed.  HENT:     Head: Normocephalic and atraumatic.  Eyes:     General: No scleral icterus.    Conjunctiva/sclera: Conjunctivae normal.     Pupils: Pupils are equal, round, and reactive to light.  Neck:     Vascular: No JVD.     Trachea: No tracheal deviation.  Cardiovascular:     Rate and Rhythm: Normal rate and regular rhythm.     Heart sounds: Normal heart sounds. No murmur heard. Pulmonary:     Effort: Pulmonary effort is normal. No tachypnea, accessory muscle usage or respiratory distress.     Breath sounds: No stridor. No wheezing, rhonchi or rales.  Abdominal:     General: Bowel sounds are normal. There is no distension.     Palpations: Abdomen is soft.     Tenderness: There is no abdominal tenderness.  Musculoskeletal:        General: No tenderness.     Cervical back: Neck supple.  Lymphadenopathy:     Cervical: No cervical adenopathy.  Skin:    General: Skin is warm and dry.     Capillary Refill: Capillary refill takes less than 2 seconds.     Findings: No rash.  Neurological:     Mental Status: She is alert and oriented to person, place, and time.  Psychiatric:        Behavior: Behavior normal.     Vitals:   05/16/21 1058  BP: 130/72  Pulse: (!) 101  Temp: 98 F (36.7 C)  TempSrc: Oral  SpO2: 98%  Weight: 166 lb (75.3 kg)  Height: 5\' 5"  (1.651 m)   98% on  BMI Readings from Last 3 Encounters:  05/16/21 27.62 kg/m  10/20/20 26.26 kg/m  09/03/20 24.79 kg/m   Wt Readings from Last 3 Encounters:  05/16/21 166 lb (75.3 kg)  10/20/20 157 lb 12.8 oz (71.6 kg)  09/03/20 149 lb (67.6 kg)     CBC    Component Value  Date/Time   WBC 12.3 (H) 07/30/2020 0051   RBC 4.10 07/30/2020 0051   HGB 11.5 (L) 07/30/2020 0051   HCT 37.7 07/30/2020 0051   PLT 199 07/30/2020 0051   MCV 92.0 07/30/2020 0051   MCH 28.0 07/30/2020 0051   MCHC 30.5 07/30/2020 0051   RDW 15.5 07/30/2020 0051    Chest Imaging: October 2020 CT chest: Right middle lobe groundglass opacity adjacent to the fissure line of the posterior, lower lobe.  CT scan of the chest August 2021: Persistent right middle lobe groundglass opacity and, now more solid in appearance.  Also has curved edge of lobulation. This is concerning for primary bronchogenic carcinoma. The patient's images have been independently reviewed by me.    CT chest April 15, 2021: CT imaging reviewed.  No evidence of recurrent metastatic disease.  Right middle lobe postsurgical changes status post right middle lobe lobectomy.  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  Coronary calcifications The patient's images have been independently reviewed by me.      Pulmonary Functions Testing Results: PFT Results Latest Ref Rng & Units 04/16/2020  FVC-Pre L 2.58  FVC-Predicted Pre % 84  FVC-Post L 2.48  FVC-Predicted Post % 81  Pre FEV1/FVC % % 74  Post FEV1/FCV % % 79  FEV1-Pre L 1.91  FEV1-Predicted Pre % 83  FEV1-Post L 1.96  DLCO uncorrected ml/min/mmHg 14.54  DLCO UNC% % 72  DLCO corrected ml/min/mmHg 14.54  DLCO COR %Predicted % 72  DLVA Predicted % 90    FeNO:   Pathology:   FINAL MICROSCOPIC DIAGNOSIS:   A. LUNG, RIGHT MIDDLE LOBE, LOBECTOMY:  - Adenocarcinoma, 0.6 cm.  - Margins not involved.  - Tumor involves subpleural connective tissue.  - Peribronchial lymph node with no metastatic carcinoma (0/1).   B. LYMPH NODE, HILAR, EXCISION:  - Anthracotic lymph node with no metastatic carcinoma (0/1).   C. LYMPH NODE, HILAR #2, EXCISION:  - Anthracotic lymph node with no metastatic carcinoma (0/1).   D. LYMPH NODE, HILAR #3,  EXCISION:  - Anthracotic lymph node with no metastatic carcinoma (0/1).   E. LYMPH NODE, HILAR #4, EXCISION:  - Anthracotic lymph node with no metastatic carcinoma (0/1).   F. LYMPH NODE, 9, EXCISION:  - Anthracotic lymph node with no metastatic carcinoma (0/1).   G. LYMPH NODE, 7, EXCISION:  - Anthracotic lymph node with no metastatic carcinoma (0/1).   H. LYMPH NODE, 7 #2, EXCISION:  - Anthracotic lymph node with no metastatic carcinoma (0/1).   Echocardiogram:   Heart Catheterization:      Assessment & Plan:     ICD-10-CM   1. Adenocarcinoma, lung, right (HCC)  C34.91 CT Chest Wo Contrast    2. Centrilobular emphysema ([place])  J43.2     3. Former smoker  Z87.891     4. Malignant neoplasm of unspecified part of unspecified bronchus or lung (HCC)  C34.90 CT Chest Wo Contrast    5. S/P lobectomy of lung  Z90.2     6. Coronary artery calcification  I25.10    I25.84       Assessment:   [name] with a persistent semisolid groundglass nodule in the right middle lobe status post right middle lobectomy by Dr. Kipp Brood, positive for adenocarcinoma of the right lung.  Doing well post resection.  CT imaging does have evidence o of centrilobular emphysema she also underwent a hiatal hernia repair.  Patient would like continued follow-up of her coronary calcifications.  She already has established care with cardiology.  She plans to make an appointment to see them  Plan: Repeat noncontrasted CT scan of the chest in 6 months for lung cancer surveillance. She can see me or one of the nurse practitioners in follow-up in 6 months to review scan. Continue albuterol as needed. Currently not on a maintenance inhaler. At some point may benefit from being on maintenance COPD care. She is not interested in being on inhaler at this time. Follow-up with [place] in 6 months or as needed.  After CT chest.  April 2023      Current Outpatient Medications:    albuterol  (VENTOLIN HFA) 108 (90 Base) MCG/ACT inhaler, Inhale 2 puffs into the lungs every 6 (six) hours as needed for wheezing or shortness of breath., Disp: 8 g, Rfl: 6   aspirin EC 81 MG tablet, Take 81 mg by mouth daily. Swallow whole., Disp: , Rfl:    Azelastine HCl 0.15 % SOLN, Place 1 spray into both nostrils in the morning and at bedtime. , Disp: , Rfl:    Cholecalciferol (VITAMIN D) 50 MCG (2000 UT) CAPS, Take 2,000 Units by mouth daily., Disp: , Rfl:    escitalopram (LEXAPRO) 10 MG tablet, Take 10 mg by mouth daily. , Disp: , Rfl:    lisinopril-hydrochlorothiazide (ZESTORETIC) 20-25 MG tablet, Take 1 tablet by mouth daily. , Disp: , Rfl:    montelukast (SINGULAIR) 10 MG tablet, Take 10 mg by mouth daily., Disp: , Rfl:    Multiple Vitamin (MULTIVITAMIN) tablet, Take 1 tablet by mouth daily., Disp: , Rfl:    pantoprazole (PROTONIX) 40 MG tablet, Take 1 tablet (40 mg total) by mouth daily., Disp: 30 tablet, Rfl: 6   potassium chloride (KLOR-CON) 10 MEQ tablet, Take 1 tablet (10 mEq total) by mouth daily., Disp: 90 tablet, Rfl: 3   metoprolol succinate (TOPROL-XL) 25 MG 24 hr tablet, Take 1 tablet (25 mg total) by mouth daily. Take with or immediately following a meal., Disp: 90 tablet, Rfl: 3   oxybutynin (DITROPAN-XL) 5 MG 24 hr tablet, Take 5 mg by mouth at bedtime.  (Patient not taking: Reported on 05/16/2021), Disp: , Rfl:    Garner Nash, DO [facility] 05/16/2021 11:07 AM

## 2021-05-16 NOTE — Patient Instructions (Signed)
Thank you for visiting Dr. Valeta Harms at [facility]. Today we recommend the following:  Orders Placed This Encounter  Procedures   CT Chest Wo Contrast   Repeat CT Chest in 6 months See [place] afterwards.   Return in about 6 months (around 11/13/2021) for with APP or Dr. Valeta Harms.    Please do your part to reduce the spread of COVID-19.

## 2021-05-31 ENCOUNTER — Telehealth: Payer: Self-pay | Admitting: Cardiovascular Disease

## 2021-05-31 NOTE — Telephone Encounter (Signed)
No answer , left message to call [facility]  tomorrow to speak triage

## 2021-05-31 NOTE — Telephone Encounter (Signed)
Pt c/o of Chest Pain: STAT if CP now or developed within 24 hours  1. Are you having CP right now? no  2. Are you experiencing any other symptoms (ex. SOB, nausea, vomiting, sweating)? no  3. How long have you been experiencing CP? One month   4. Is your CP continuous or coming and going? Only at night when she is getting ready to go to sleep two times a week   5. Have you taken Nitroglycerin? no ?

## 2021-06-01 ENCOUNTER — Telehealth: Payer: Self-pay

## 2021-06-01 ENCOUNTER — Other Ambulatory Visit: Payer: Self-pay

## 2021-06-01 DIAGNOSIS — R079 Chest pain, unspecified: Secondary | ICD-10-CM

## 2021-06-01 DIAGNOSIS — Z79899 Other long term (current) drug therapy: Secondary | ICD-10-CM

## 2021-06-01 MED ORDER — METOPROLOL TARTRATE 25 MG PO TABS
ORAL_TABLET | ORAL | 0 refills | Status: DC
Start: 2021-06-01 — End: 2022-09-22

## 2021-06-01 MED ORDER — METOPROLOL SUCCINATE ER 25 MG PO TB24: 25.0000 mg | ORAL_TABLET | Freq: Every day | ORAL | 3 refills | Status: DC

## 2021-06-01 NOTE — Telephone Encounter (Signed)
Called pt back. She states she was at the pulmonary doctor's office. They said there is calcification in her heart. Pt reports chest pain only at night when laying down. "It's not every night but sometimes, it's a little sharp pain it last about 4-5 seconds and goes away. It has been going on for over a month." Pt denies numbness, SOB. "It's dead in the middle of my chest like it's my heart." It happens about 2-3 times a week. Sometimes it's more than one pain at night, 2-3 a night once in awhile. 1-2 in the last several weeks. Only when laying down, never when up and moving. Denies any other symptoms. Pt was recommends to set up an appt with Dr. Gwenlyn Found. Pt added to February 22nd, she would prefer to be seen sooner. Will get information to Dr. Gwenlyn Found and his nurse.

## 2021-06-01 NOTE — Progress Notes (Signed)
3-7 days before the CT scan get lab work drawn at Dr. Kennon Holter office. (No appointment needed for lab work). Zacarias Pontes will call you to set up the CT scan.       Your cardiac CT will be scheduled at one of the below locations:   [facility] [address] [place], [place] [address] [phone]  If scheduled at [facility], please arrive at the [facility] main entrance (entrance A) of [facility] 30 minutes prior to test start time. You can use the FREE valet parking offered at the main entrance (encouraged to control the heart rate for the test) Proceed to the [facility] Radiology Department (first floor) to check-in and test prep.  Please follow these instructions carefully (unless otherwise directed):   On the Night Before the Test: Be sure to Drink plenty of water. Do not consume any caffeinated/decaffeinated beverages or chocolate 12 hours prior to your test. Do not take any antihistamines 12 hours prior to your test.   On the Day of the Test: Drink plenty of water until 1 hour prior to the test. Do not eat any food 4 hours prior to the test. HOLD Lisinopril-Hydrochlorothiazide (Zestoretic) and Metoprolol  succinate (Toprol-XL) morning of the test. You may take your other regular medications prior to the test.  Take metoprolol tartrate (Lopressor) 25 mg two hours prior to test. (Prescription sent to pharmacy)  FEMALES- please wear underwire-free bra if available, avoid dresses & tight clothing      After the Test: Drink plenty of water. After receiving IV contrast, you may experience a mild flushed feeling. This is normal. On occasion, you may experience a mild rash up to 24 hours after the test. This is not dangerous. If this occurs, you can take Benadryl 25 mg and increase your fluid intake. If you experience trouble breathing, this can be serious. If it is severe call 911 IMMEDIATELY. If it is mild, please call our office. If you take any  of these medications: Glipizide/Metformin, Avandament, Glucavance, please do not take 48 hours after completing test unless otherwise instructed.  Please allow 2-4 weeks for scheduling of routine cardiac CTs. Some insurance companies require a pre-authorization which may delay scheduling of this test.   For non-scheduling related questions, please contact the cardiac imaging nurse navigator should you have any questions/concerns: Marchia Bond, Cardiac Imaging Nurse Navigator Gordy Clement, Cardiac Imaging Nurse Navigator [facility] Direct Office Dial: [phone]   For scheduling needs, including cancellations and rescheduling, please call [place], [phone].   Sent this instructions to patient. Pt verbalized understanding and encouraged to call back with any questions. Pt has a follow-up appointment 2/22 with Dr . Gwenlyn Found

## 2021-06-01 NOTE — Telephone Encounter (Signed)
Spoke to pt. See progress note.

## 2021-06-01 NOTE — Telephone Encounter (Signed)
Patient returning call to Triage

## 2021-06-01 NOTE — Telephone Encounter (Signed)
Spoke with patient regarding chest pain. She reports having a sharp pain on the left side of chest that lasts a few seconds a couple of times at night while in bed. She denies sob, dizziness, chest pain during the day or with any activity. Recommeded that if patient becomes symptomatic or the pain gets worse to go to the emergency room. Does have an appointment with Dr. Gwenlyn Found in February and will keep it. Pt understood conversation.

## 2021-06-09 DIAGNOSIS — R079 Chest pain, unspecified: Secondary | ICD-10-CM | POA: Diagnosis not present

## 2021-06-09 DIAGNOSIS — Z79899 Other long term (current) drug therapy: Secondary | ICD-10-CM | POA: Diagnosis not present

## 2021-06-09 LAB — BASIC METABOLIC PANEL
BUN/Creatinine Ratio: 13 (ref 12–28)
BUN: 14 mg/dL (ref 8–27)
CO2: 26 mmol/L (ref 20–29)
Calcium: 8.7 mg/dL (ref 8.7–10.3)
Chloride: 102 mmol/L (ref 96–106)
Creatinine, Ser: 1.06 mg/dL — ABNORMAL HIGH (ref 0.57–1.00)
Glucose: 103 mg/dL — ABNORMAL HIGH (ref 70–99)
Potassium: 3.5 mmol/L (ref 3.5–5.2)
Sodium: 145 mmol/L — ABNORMAL HIGH (ref 134–144)
eGFR: 55 mL/min/{1.73_m2} — ABNORMAL LOW (ref >59)

## 2021-06-13 ENCOUNTER — Telehealth (HOSPITAL_COMMUNITY): Payer: Self-pay | Admitting: *Deleted

## 2021-06-13 ENCOUNTER — Other Ambulatory Visit (HOSPITAL_COMMUNITY): Payer: Self-pay | Admitting: *Deleted

## 2021-06-13 MED ORDER — METOPROLOL TARTRATE 100 MG PO TABS: ORAL_TABLET | ORAL | 0 refills | Status: DC

## 2021-06-13 NOTE — Telephone Encounter (Signed)
Reaching out to patient to offer assistance regarding upcoming cardiac imaging study; pt verbalizes understanding of appt date/time, parking situation and where to check in, pre-test NPO status and medications ordered, and verified current allergies; name and call back number provided for further questions should they arise  [facility] [phone] office [phone] cell  After discussing with patient, she tells me her HR is 94 therefore 100mg  metoprolol tartrate was sent to pharmacy per protocol for test.  Patient verbalized understanding of how to take pill and she is aware to arrive at 7:15am for her 7:45am scan.

## 2021-06-14 ENCOUNTER — Ambulatory Visit (HOSPITAL_COMMUNITY)
Admission: RE | Admit: 2021-06-14 | Discharge: 2021-06-14 | Disposition: A | Discharge status: Home / Self Care | Payer: Medicare Other | Source: Ambulatory Visit | Attending: Cardiovascular Disease | Admitting: Cardiovascular Disease

## 2021-06-14 ENCOUNTER — Other Ambulatory Visit: Payer: Self-pay

## 2021-06-14 DIAGNOSIS — I700 Atherosclerosis of aorta: Secondary | ICD-10-CM | POA: Insufficient documentation

## 2021-06-14 DIAGNOSIS — I2510 Atherosclerotic heart disease of native coronary artery without angina pectoris: Secondary | ICD-10-CM | POA: Insufficient documentation

## 2021-06-14 DIAGNOSIS — R079 Chest pain, unspecified: Secondary | ICD-10-CM | POA: Diagnosis not present

## 2021-06-14 MED ORDER — NITROGLYCERIN 0.4 MG SL SUBL
SUBLINGUAL_TABLET | SUBLINGUAL | Status: AC
  Filled 2021-06-14: qty 2

## 2021-06-14 MED ORDER — IOHEXOL 350 MG/ML SOLN
95.0000 mL | Freq: Once | INTRAVENOUS | Status: AC | PRN
  Administered 2021-06-14: 95 mL via INTRAVENOUS

## 2021-06-14 MED ORDER — NITROGLYCERIN 0.4 MG SL SUBL
0.8000 mg | SUBLINGUAL_TABLET | Freq: Once | SUBLINGUAL | Status: AC
  Administered 2021-06-14: 0.8 mg via SUBLINGUAL

## 2021-06-15 NOTE — Telephone Encounter (Signed)
Late Entry: Returned patient's call regarding her chest pain that has been ongoing for one month at nighttime. Left a message for her to call back so I could help her.

## 2021-06-21 ENCOUNTER — Other Ambulatory Visit: Payer: Self-pay | Admitting: Physician Assistant

## 2021-07-07 ENCOUNTER — Other Ambulatory Visit: Payer: Self-pay

## 2021-07-07 ENCOUNTER — Encounter: Payer: Self-pay | Admitting: Cardiovascular Disease

## 2021-07-07 ENCOUNTER — Ambulatory Visit: Payer: Medicare Other | Admitting: Cardiovascular Disease

## 2021-07-07 DIAGNOSIS — E782 Mixed hyperlipidemia: Secondary | ICD-10-CM | POA: Diagnosis not present

## 2021-07-07 DIAGNOSIS — R931 Abnormal findings on diagnostic imaging of heart and coronary circulation: Secondary | ICD-10-CM | POA: Diagnosis not present

## 2021-07-07 DIAGNOSIS — E785 Hyperlipidemia, unspecified: Secondary | ICD-10-CM | POA: Insufficient documentation

## 2021-07-07 DIAGNOSIS — I10 Essential (primary) hypertension: Secondary | ICD-10-CM | POA: Diagnosis not present

## 2021-07-07 NOTE — Assessment & Plan Note (Signed)
History of essential hypertension a blood pressure measured today at 128/65.  She is on metoprolol, lisinopril and hydrochlorothiazide.

## 2021-07-07 NOTE — Assessment & Plan Note (Signed)
Coronary CTA done for preoperative clearance before robotic hernia repair by Dr. Kipp Brood 06/14/2021 revealed a coronary calcium score of 238 with mild nonobstructive CAD.

## 2021-07-07 NOTE — Patient Instructions (Signed)
Medication Instructions:  Your physician recommends that you continue on your current medications as directed. Please refer to the Current Medication list given to you today.   *If you need a refill on your cardiac medications before your next appointment, please call your pharmacy*   Lab Work: Your physician recommends that you have labs drawn today: fasting lipid/liver profile.  If you have labs (blood work) drawn today and your tests are completely normal, you will receive your results only by: [place] (if you have MyChart) OR A paper copy in the mail If you have any lab test that is abnormal or we need to change your treatment, we will call you to review the results.   Follow-Up: At [facility], you and your health needs are our priority.  As part of our continuing mission to provide you with exceptional heart care, we have created designated Provider Care Teams.  These Care Teams include your primary Cardiologist (physician) and Advanced Practice Providers (APPs -  Physician Assistants and Nurse Practitioners) who all work together to provide you with the care you need, when you need it.  We recommend signing up for the patient portal called "MyChart".  Sign up information is provided on this After Visit Summary.  MyChart is used to connect with patients for Virtual Visits (Telemedicine).  Patients are able to view lab/test results, encounter notes, upcoming appointments, etc.  Non-urgent messages can be sent to your provider as well.   To learn more about what you can do with MyChart, go to [email or web address].    Your next appointment:   12 month(s)  The format for your next appointment:   In Person  Provider:   Quay Burow, MD

## 2021-07-07 NOTE — Progress Notes (Signed)
07/07/2021 Kimberly Beck   June 22, 1946  [id number]  Primary Physician Marda Stalker, PA-C Primary Cardiologist: Lorretta Harp MD FACP, [place], [place], [place]  HPI:  Kimberly Beck is a 76 y.o.  mildly overweight separated Caucasian female with no children referred by Dr. Kipp Brood for preoperative clearance before robotic paraesophageal hernia repair of a large hiatal hernia.    I last saw her in the office 11/24/202. 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  She smoked remotely and stopped in 1987.  She has treated hypertension.  Her father did die of a myocardial infarction at age 66.  She is never had a heart or stroke.  She denies chest pain or shortness of breath.  She had resting tachycardia for an unknown known period of time although she is unaware of this.  TSH is normal.  This was reviewed by Roderic Palau, FNP for concern that it was A. fib but it was sinus tach with PACs.   She underwent successful robotic para esophageal hernia repair 07/29/2020 by Dr. Kipp Brood.  She feels clinically improved.  Her heart rate has been better controlled on beta-blocker.    Since I saw her 8 months ago she continues to do well.  She may need an additional robotic procedure however.  She was begun on low-dose statin therapy for elevated LDL in light of her coronary calcium score of 238 performed 06/14/2021.  She did have nonobstructive CAD on CTA.  A 2D echo performed 07/21/2020 revealed normal LV systolic function with grade 1 diastolic dysfunction and no valvular abnormalities.  Current Meds  Medication Sig   aspirin EC 81 MG tablet Take 81 mg by mouth daily. Swallow whole.   Azelastine HCl 0.15 % SOLN Place 1 spray into both nostrils in the morning and at bedtime.    Cholecalciferol (VITAMIN D) 50 MCG (2000 UT) CAPS Take 2,000 Units by mouth daily.   escitalopram (LEXAPRO) 10 MG tablet Take 10 mg by mouth daily.    lisinopril-hydrochlorothiazide (ZESTORETIC) 20-25  MG tablet Take 1 tablet by mouth daily.    metoprolol succinate (TOPROL-XL) 25 MG 24 hr tablet Take 1 tablet (25 mg total) by mouth daily. Take with or immediately following a meal.   metoprolol tartrate (LOPRESSOR) 100 MG tablet Take 1 tablet (100mg ) TWO hours prior to cardiac CT scan.   metoprolol tartrate (LOPRESSOR) 25 MG tablet Take 2 hours before CT scan.   montelukast (SINGULAIR) 10 MG tablet Take 10 mg by mouth daily.   Multiple Vitamin (MULTIVITAMIN) tablet Take 1 tablet by mouth daily.   pantoprazole (PROTONIX) 40 MG tablet Take 1 tablet (40 mg total) by mouth daily.   potassium chloride (KLOR-CON) 10 MEQ tablet Take 1 tablet (10 mEq total) by mouth daily.   rosuvastatin (CRESTOR) 5 MG tablet Take 1 tablet by mouth daily.     Allergies  Allergen Reactions   Codeine Itching    Drunk feeling    Nsaids Other (See Comments)    Liver    Other     Other reaction(s): diarrhea and headache Other reaction(s): Global anesthesia Other reaction(s): edema   Penicillins Rash    Dry rash    Social History   Socioeconomic History   Marital status: Single    Spouse name: Not on file   Number of children: Not on file   Years of education: Not on file   Highest education level: Not on file  Occupational History  Not on file  Tobacco Use   Smoking status: Former    Packs/day: 3.00    Years: 25.00    Pack years: 75.00    Types: Cigarettes    Start date: 68    Quit date: 1987    Years since quitting: 36.0   Smokeless tobacco: Never  Vaping Use   Vaping Use: Never used  Substance and Sexual Activity   Alcohol use: Never   Drug use: Never   Sexual activity: Not on file  Other Topics Concern   Not on file  Social History Narrative   Not on file   Social Determinants of Health   Financial Resource Strain: Not on file  Food Insecurity: Not on file  Transportation Needs: Not on file  Physical Activity: Not on file  Stress: Not on file  Social Connections: Not on file   Intimate Partner Violence: Not on file     Review of Systems: General: negative for chills, fever, night sweats or weight changes.  Cardiovascular: negative for chest pain, dyspnea on exertion, edema, orthopnea, palpitations, paroxysmal nocturnal dyspnea or shortness of breath Dermatological: negative for rash Respiratory: negative for cough or wheezing Urologic: negative for hematuria Abdominal: negative for nausea, vomiting, diarrhea, bright red blood per rectum, melena, or hematemesis Neurologic: negative for visual changes, syncope, or dizziness All other systems reviewed and are otherwise negative except as noted above.    Blood pressure 128/65, pulse 78, height 5\' 6"  (1.676 m), weight 162 lb 12.8 oz (73.8 kg), SpO2 99 %.  General appearance: alert and no distress Neck: no adenopathy, no carotid bruit, no JVD, supple, symmetrical, trachea midline, and thyroid not enlarged, symmetric, no tenderness/mass/nodules Lungs: clear to auscultation bilaterally Heart: regular rate and rhythm, S1, S2 normal, no murmur, click, rub or gallop Extremities: extremities normal, atraumatic, no cyanosis or edema Pulses: 2+ and symmetric Skin: Skin color, texture, turgor normal. No rashes or lesions Neurologic: Grossly normal  EKG sinus rhythm at 78 with nonspecific ST and T wave changes.  I personally reviewed this EKG.  ASSESSMENT AND PLAN:   Essential hypertension History of essential hypertension a blood pressure measured today at 128/65.  She is on metoprolol, lisinopril and hydrochlorothiazide.  Hyperlipidemia History of hyperlipidemia started on low-dose statin therapy by her PCP with lipid profile performed 03/02/2021 revealing total cholesterol 229, LDL of 135 and HDL of 70.  Given her elevated coronary calcium score of 238 I prefer her LDL be less than 70.  We will recheck a lipid liver profile today.  Elevated coronary artery calcium score Coronary CTA done for preoperative clearance  before robotic hernia repair by Dr. Kipp Brood 06/14/2021 revealed a coronary calcium score of 238 with mild nonobstructive CAD.     Lorretta Harp MD FACP,FACC,FAHA, [facility] 07/07/2021 11:50 AM

## 2021-07-07 NOTE — Assessment & Plan Note (Signed)
History of hyperlipidemia started on low-dose statin therapy by her PCP with lipid profile performed 03/02/2021 revealing total cholesterol 229, LDL of 135 and HDL of 70.  Given her elevated coronary calcium score of 238 I prefer her LDL be less than 70.  We will recheck a lipid liver profile today.

## 2021-07-08 LAB — LIPID PANEL
Chol/HDL Ratio: 2.5 ratio (ref 0.0–4.4)
Cholesterol, Total: 156 mg/dL (ref 100–199)
HDL: 62 mg/dL (ref >39)
LDL Chol Calc (NIH): 76 mg/dL (ref 0–99)
Triglycerides: 99 mg/dL (ref 0–149)
VLDL Cholesterol Cal: 18 mg/dL (ref 5–40)

## 2021-07-08 LAB — HEPATIC FUNCTION PANEL
ALT: 10 IU/L (ref 0–32)
AST: 21 IU/L (ref 0–40)
Albumin: 4.3 g/dL (ref 3.7–4.7)
Alkaline Phosphatase: 86 IU/L (ref 44–121)
Bilirubin Total: 0.4 mg/dL (ref 0.0–1.2)
Bilirubin, Direct: 0.13 mg/dL (ref 0.00–0.40)
Total Protein: 6.9 g/dL (ref 6.0–8.5)

## 2021-07-25 DIAGNOSIS — M8580 Other specified disorders of bone density and structure, unspecified site: Secondary | ICD-10-CM | POA: Diagnosis not present

## 2021-07-25 DIAGNOSIS — N189 Chronic kidney disease, unspecified: Secondary | ICD-10-CM | POA: Diagnosis not present

## 2021-07-25 DIAGNOSIS — H6980 Other specified disorders of Eustachian tube, unspecified ear: Secondary | ICD-10-CM | POA: Diagnosis not present

## 2021-07-25 DIAGNOSIS — I10 Essential (primary) hypertension: Secondary | ICD-10-CM | POA: Diagnosis not present

## 2021-07-25 DIAGNOSIS — K219 Gastro-esophageal reflux disease without esophagitis: Secondary | ICD-10-CM | POA: Diagnosis not present

## 2021-07-25 DIAGNOSIS — J019 Acute sinusitis, unspecified: Secondary | ICD-10-CM | POA: Diagnosis not present

## 2021-07-25 DIAGNOSIS — D509 Iron deficiency anemia, unspecified: Secondary | ICD-10-CM | POA: Diagnosis not present

## 2021-07-25 DIAGNOSIS — R0981 Nasal congestion: Secondary | ICD-10-CM | POA: Diagnosis not present

## 2021-07-25 DIAGNOSIS — M810 Age-related osteoporosis without current pathological fracture: Secondary | ICD-10-CM | POA: Diagnosis not present

## 2021-07-25 DIAGNOSIS — R0989 Other specified symptoms and signs involving the circulatory and respiratory systems: Secondary | ICD-10-CM | POA: Diagnosis not present

## 2021-07-25 DIAGNOSIS — J309 Allergic rhinitis, unspecified: Secondary | ICD-10-CM | POA: Diagnosis not present

## 2021-08-01 DIAGNOSIS — R0981 Nasal congestion: Secondary | ICD-10-CM | POA: Diagnosis not present

## 2021-08-01 DIAGNOSIS — R42 Dizziness and giddiness: Secondary | ICD-10-CM | POA: Diagnosis not present

## 2021-08-01 DIAGNOSIS — J019 Acute sinusitis, unspecified: Secondary | ICD-10-CM | POA: Diagnosis not present

## 2021-08-01 DIAGNOSIS — R059 Cough, unspecified: Secondary | ICD-10-CM | POA: Diagnosis not present

## 2021-08-19 ENCOUNTER — Other Ambulatory Visit: Payer: Self-pay | Admitting: Thoracic Surgery (Cardiothoracic Vascular Surgery)

## 2021-08-19 DIAGNOSIS — C3490 Malignant neoplasm of unspecified part of unspecified bronchus or lung: Secondary | ICD-10-CM

## 2021-08-24 ENCOUNTER — Ambulatory Visit: Payer: Medicare Other | Admitting: Cardiovascular Disease

## 2021-09-06 ENCOUNTER — Other Ambulatory Visit: Payer: Self-pay | Admitting: Physician Assistant

## 2021-09-16 ENCOUNTER — Other Ambulatory Visit: Payer: Self-pay

## 2021-09-16 ENCOUNTER — Ambulatory Visit
Admission: RE | Admit: 2021-09-16 | Discharge: 2021-09-16 | Disposition: A | Discharge status: Home / Self Care | Payer: Medicare Other | Source: Ambulatory Visit | Attending: Thoracic Surgery (Cardiothoracic Vascular Surgery) | Admitting: Thoracic Surgery (Cardiothoracic Vascular Surgery)

## 2021-09-16 ENCOUNTER — Ambulatory Visit: Payer: Medicare Other | Admitting: Thoracic Surgery (Cardiothoracic Vascular Surgery)

## 2021-09-16 VITALS — BP 140/80 | HR 94 | Resp 20 | Ht 66.0 in | Wt 162.0 lb

## 2021-09-16 DIAGNOSIS — Z85118 Personal history of other malignant neoplasm of bronchus and lung: Secondary | ICD-10-CM | POA: Diagnosis not present

## 2021-09-16 DIAGNOSIS — C3490 Malignant neoplasm of unspecified part of unspecified bronchus or lung: Secondary | ICD-10-CM | POA: Diagnosis not present

## 2021-09-16 DIAGNOSIS — Z902 Acquired absence of lung [part of]: Secondary | ICD-10-CM

## 2021-09-16 NOTE — Progress Notes (Signed)
? ?   ?  [address] ?      Kimberly Beck [address] ?            [phone]       ? ?Kimberly Beck ?[facility] Medical Record #[id number] ?Date of Birth: 04/28/1946 ? ?Referring: Runell Gess, MD ?Primary Care: Jarrett Soho, PA-C ?Primary Cardiologist:None ? ?Reason for visit:   follow-up ? ?History of Present Illness:     ?Kimberly Beck comes in for annual postop visit.  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  She did develop mild recurrence of her hiatal hernia which is treated with Protonix.  She denies any dysphagia or odynophagia.  She has no complaints today ? ?Physical Exam: ?BP 140/80   Pulse 94   Resp 20   Ht 5\' 6"  (1.676 m)   Wt 162 lb (73.5 kg)   SpO2 95% Comment: RA  BMI 26.15 kg/m?  ? ?Alert NAD ?Abdomen, ND ?No peripheral edema ? ? ?Diagnostic Studies & Laboratory data: ?CT chest: No evidence of recurrence or metastatic disease.  Her hiatal hernia is stable, and small. ?  ? ?Assessment / Plan:   ?76 year old female status post right middle lobectomy for stage I adenocarcinoma October 2021 status post paraesophageal hernia repair with mild recurrence.  In regards to the hiatal hernia she is minimally symptomatic and does not want to do anything about this.  She will continue surveillance for her lung cancer we will follow-up in a year virtually with cross-sectional imaging. ? ? ?Kimberly Beck ?09/16/2021 11:48 AM ? ? ? ? ? ? ?

## 2021-09-26 ENCOUNTER — Other Ambulatory Visit: Payer: Self-pay | Admitting: Thoracic Surgery (Cardiothoracic Vascular Surgery)

## 2021-10-17 ENCOUNTER — Telehealth: Payer: Self-pay | Admitting: Pulmonary Disease

## 2021-10-17 NOTE — Telephone Encounter (Signed)
Please advise if she needs a CT for this month since one was done 09/16/21. ?

## 2021-10-18 NOTE — Telephone Encounter (Signed)
Done

## 2021-10-21 ENCOUNTER — Other Ambulatory Visit: Payer: Self-pay | Admitting: *Deleted

## 2021-10-21 MED ORDER — PANTOPRAZOLE SODIUM 40 MG PO TBEC: 40.0000 mg | DELAYED_RELEASE_TABLET | Freq: Every day | ORAL | 11 refills | Status: DC

## 2021-10-21 NOTE — Progress Notes (Signed)
Patient contacted the office requesting a refill of Protonix. Per Dr. Kipp Brood, a year's worth was sent to [place] per patient request. Patient aware. ?

## 2021-12-11 ENCOUNTER — Other Ambulatory Visit: Payer: Self-pay | Admitting: Physician Assistant

## 2022-01-24 IMAGING — RF DG ESOPHAGUS
7 series · 18 of 23 positions shown · non-contrast
Comparison: None.

CLINICAL DATA: Post prior hiatal hernia repair

EXAM:
ESOPHOGRAM/BARIUM SWALLOW
TECHNIQUE: Single contrast examination was performed using water-soluble
contrast.
FLUOROSCOPY TIME:  Fluoroscopy Time:  36 seconds
Radiation Exposure Index (if provided by the fluoroscopic device):
23.9 mGy

[Series 1: cp_standard · 0.34mm/px · 2 of 4 frames shown (1 of 7)]
[frame 1/4]
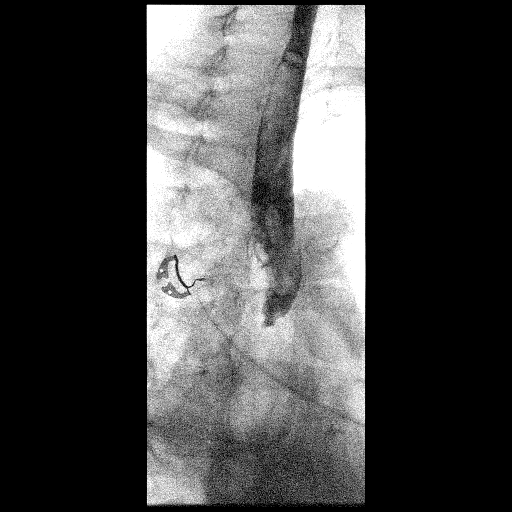
[frame 3/4]
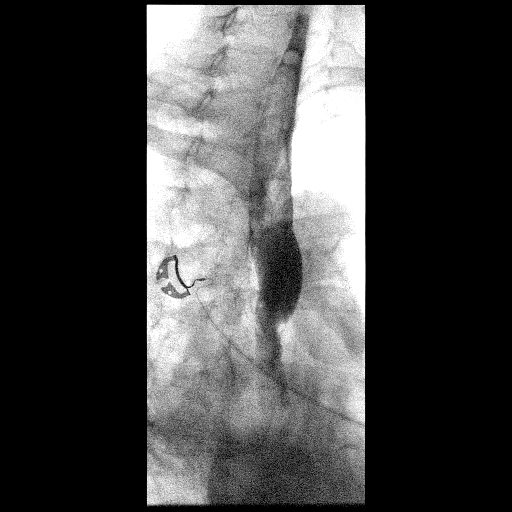

[Series 2: cp_standard · 0.34mm/px · 3 of 14 frames shown (2 of 7)]
[frame 3/14]
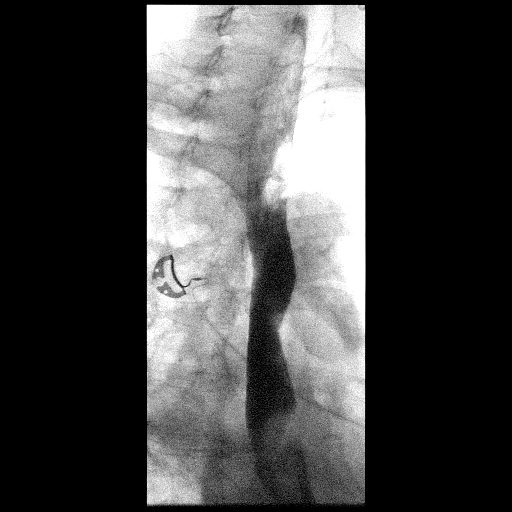
[frame 6/14]
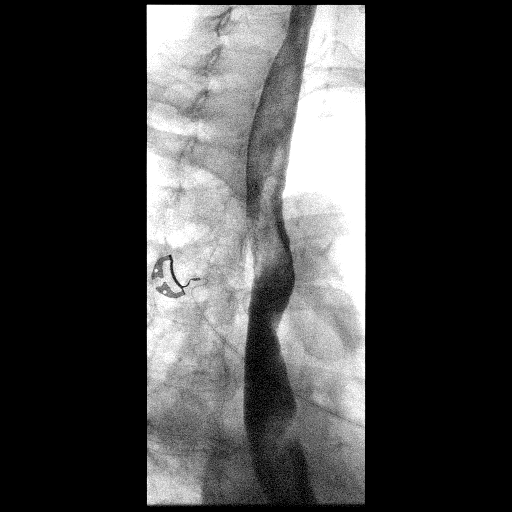
[frame 8/14]
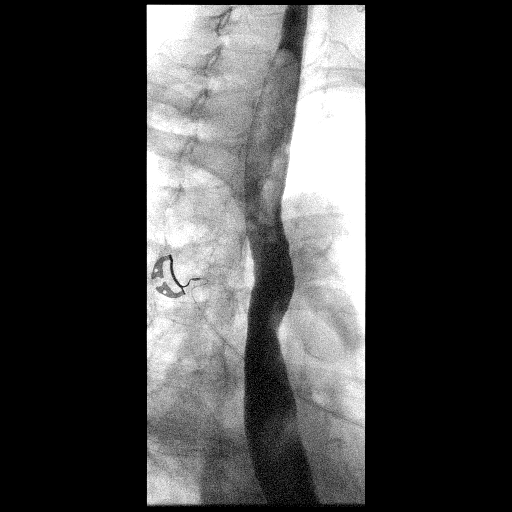

[Series 3: cp_standard · 0.34mm/px · 3 of 6 frames shown (3 of 7)]
[frame 1/6]
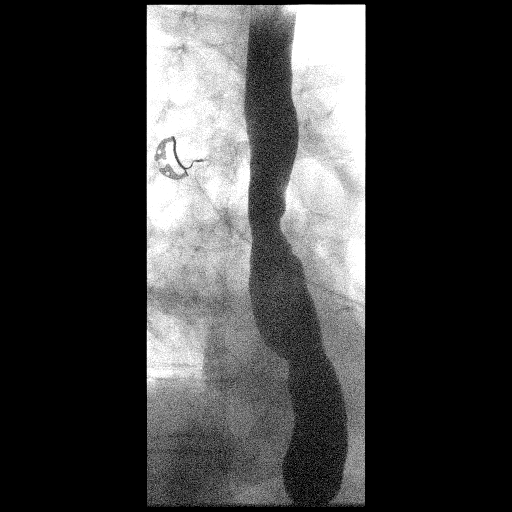
[frame 4/6]
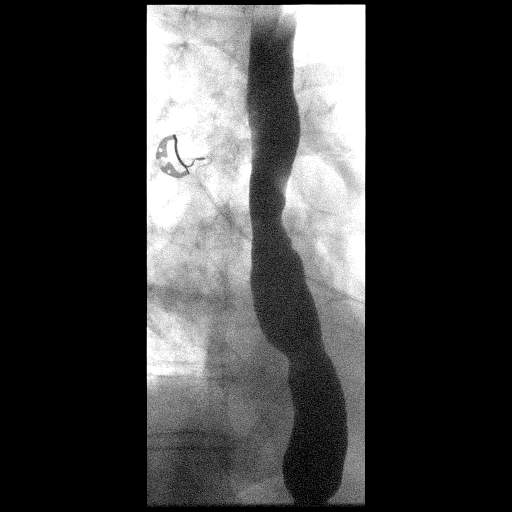
[frame 6/6]
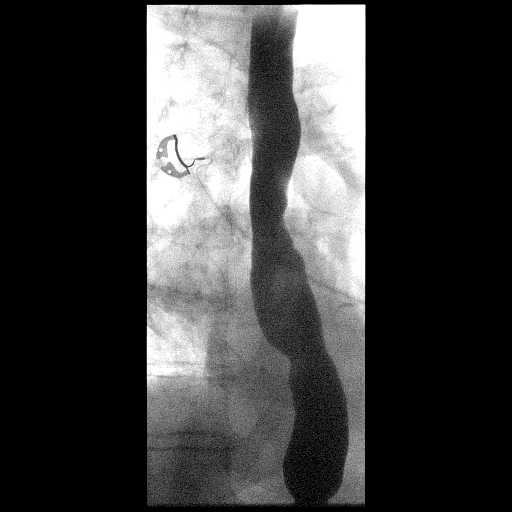

[Series 4: cp_standard · 0.34mm/px · 3 of 47 frames shown (4 of 7)]
[frame 8/47]
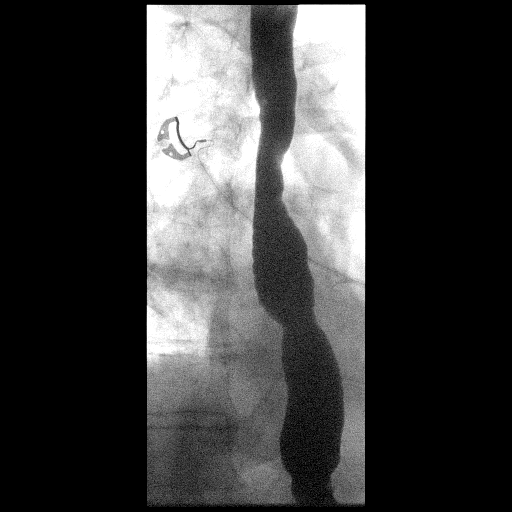
[frame 27/47]
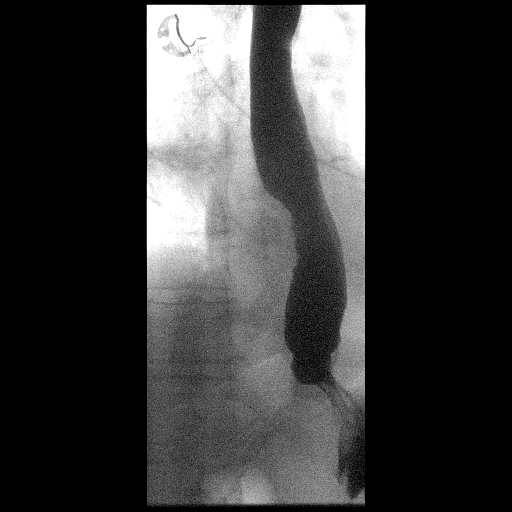
[frame 40/47]
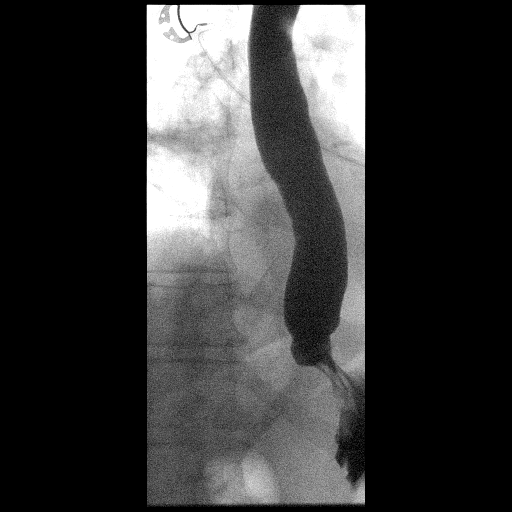

[Series 5: cp_standard · 0.34mm/px · 3 of 71 frames shown (5 of 7)]
[frame 11/71]
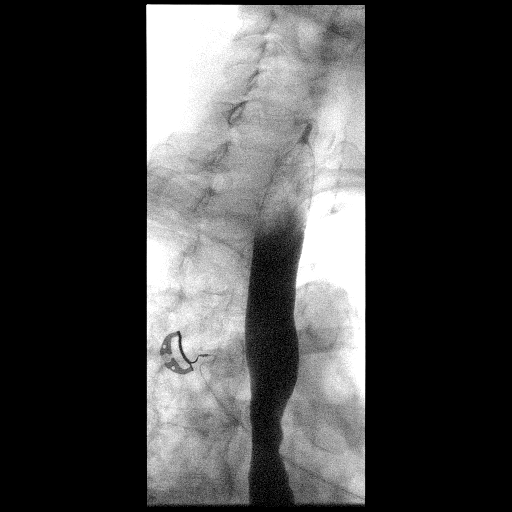
[frame 17/71]
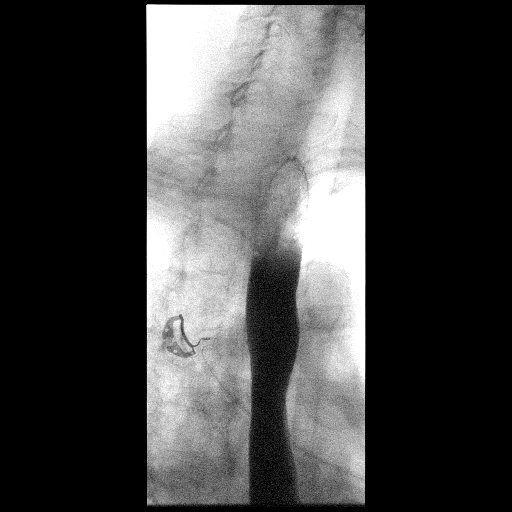
[frame 61/71]
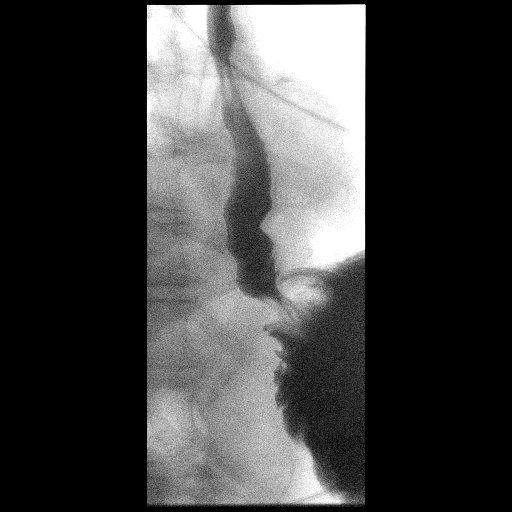

[Series 6: cp_standard · 0.25mm/px · 1 of 1 slices shown (6 of 7)]
[im 1/1]
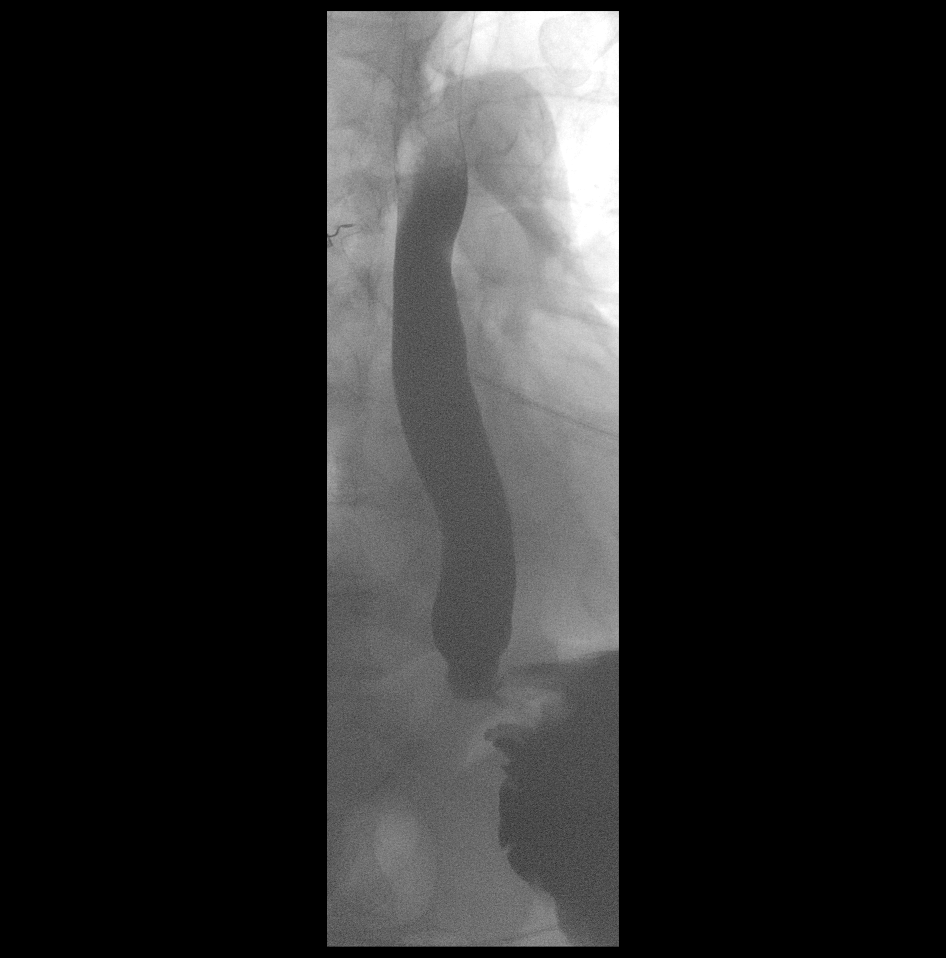

[Series 7: cp_standard · 0.51mm/px · 3 of 61 frames shown (7 of 7)]
[frame 10/61]
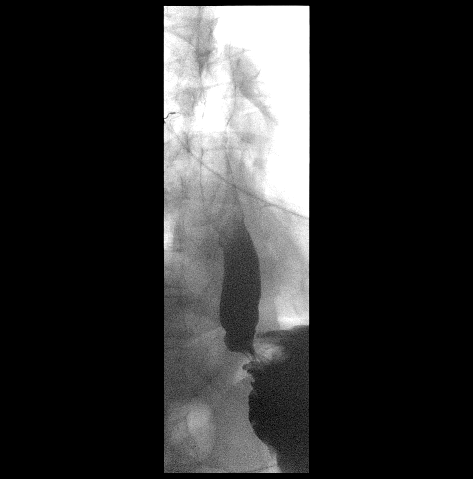
[frame 31/61]
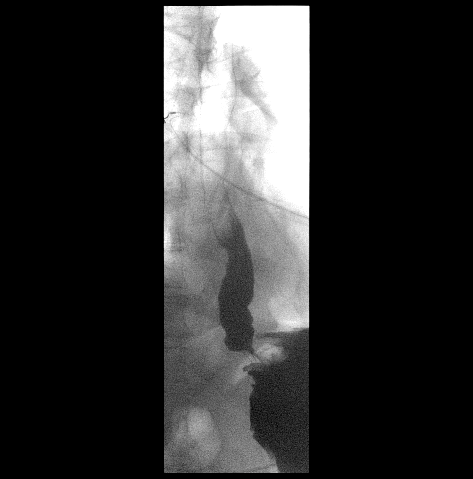
[frame 52/61]
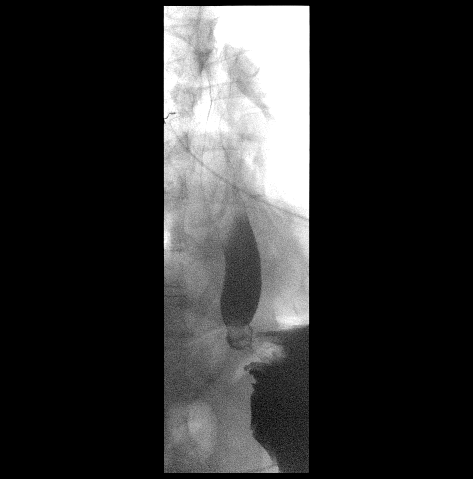

[18 of 23 positions shown; findings below may reference images not displayed]

FINDINGS: Patient swallowed contrast without difficulty. Contrast readily
passes into the stomach. Mild delay and retention at the
gastroesophageal junction probably reflects postoperative edema.
There is no evidence of extravasation. No hiatal hernia.
IMPRESSION: No evidence of leak. Contrast readily passes into the stomach with
mild delay/retention probably secondary to postoperative edema.

## 2022-02-13 ENCOUNTER — Other Ambulatory Visit: Payer: Self-pay | Admitting: Cardiovascular Disease

## 2022-02-14 ENCOUNTER — Other Ambulatory Visit: Payer: Self-pay | Admitting: Family Medicine

## 2022-02-14 DIAGNOSIS — Z1231 Encounter for screening mammogram for malignant neoplasm of breast: Secondary | ICD-10-CM

## 2022-03-08 DIAGNOSIS — Z0000 Encounter for general adult medical examination without abnormal findings: Secondary | ICD-10-CM | POA: Diagnosis not present

## 2022-03-08 DIAGNOSIS — N189 Chronic kidney disease, unspecified: Secondary | ICD-10-CM | POA: Diagnosis not present

## 2022-03-08 DIAGNOSIS — D509 Iron deficiency anemia, unspecified: Secondary | ICD-10-CM | POA: Diagnosis not present

## 2022-03-08 DIAGNOSIS — J309 Allergic rhinitis, unspecified: Secondary | ICD-10-CM | POA: Diagnosis not present

## 2022-03-08 DIAGNOSIS — M810 Age-related osteoporosis without current pathological fracture: Secondary | ICD-10-CM | POA: Diagnosis not present

## 2022-03-08 DIAGNOSIS — R7301 Impaired fasting glucose: Secondary | ICD-10-CM | POA: Diagnosis not present

## 2022-03-08 DIAGNOSIS — H9191 Unspecified hearing loss, right ear: Secondary | ICD-10-CM | POA: Diagnosis not present

## 2022-03-08 DIAGNOSIS — Z23 Encounter for immunization: Secondary | ICD-10-CM | POA: Diagnosis not present

## 2022-03-08 DIAGNOSIS — E559 Vitamin D deficiency, unspecified: Secondary | ICD-10-CM | POA: Diagnosis not present

## 2022-03-08 DIAGNOSIS — H6980 Other specified disorders of Eustachian tube, unspecified ear: Secondary | ICD-10-CM | POA: Diagnosis not present

## 2022-03-08 DIAGNOSIS — I10 Essential (primary) hypertension: Secondary | ICD-10-CM | POA: Diagnosis not present

## 2022-03-09 ENCOUNTER — Other Ambulatory Visit: Payer: Self-pay | Admitting: Family Medicine

## 2022-03-09 DIAGNOSIS — M810 Age-related osteoporosis without current pathological fracture: Secondary | ICD-10-CM

## 2022-03-14 ENCOUNTER — Ambulatory Visit
Admission: RE | Admit: 2022-03-14 | Discharge: 2022-03-14 | Disposition: A | Discharge status: Home / Self Care | Payer: Medicare Other | Source: Ambulatory Visit | Attending: Family Medicine | Admitting: Family Medicine

## 2022-03-14 DIAGNOSIS — M810 Age-related osteoporosis without current pathological fracture: Secondary | ICD-10-CM

## 2022-03-14 DIAGNOSIS — M8589 Other specified disorders of bone density and structure, multiple sites: Secondary | ICD-10-CM | POA: Diagnosis not present

## 2022-03-14 DIAGNOSIS — Z780 Asymptomatic menopausal state: Secondary | ICD-10-CM | POA: Diagnosis not present

## 2022-03-27 ENCOUNTER — Ambulatory Visit
Admission: RE | Admit: 2022-03-27 | Discharge: 2022-03-27 | Disposition: A | Discharge status: Home / Self Care | Payer: Medicare Other | Source: Ambulatory Visit | Attending: Family Medicine | Admitting: Family Medicine

## 2022-03-27 DIAGNOSIS — Z1231 Encounter for screening mammogram for malignant neoplasm of breast: Secondary | ICD-10-CM | POA: Diagnosis not present

## 2022-04-06 DIAGNOSIS — I10 Essential (primary) hypertension: Secondary | ICD-10-CM | POA: Diagnosis not present

## 2022-04-06 DIAGNOSIS — D649 Anemia, unspecified: Secondary | ICD-10-CM | POA: Diagnosis not present

## 2022-04-06 DIAGNOSIS — M810 Age-related osteoporosis without current pathological fracture: Secondary | ICD-10-CM | POA: Diagnosis not present

## 2022-04-06 DIAGNOSIS — K219 Gastro-esophageal reflux disease without esophagitis: Secondary | ICD-10-CM | POA: Diagnosis not present

## 2022-04-06 DIAGNOSIS — N189 Chronic kidney disease, unspecified: Secondary | ICD-10-CM | POA: Diagnosis not present

## 2022-05-03 ENCOUNTER — Ambulatory Visit: Payer: Medicare Other | Admitting: Pulmonary Disease

## 2022-05-03 VITALS — BP 130/80 | HR 65 | Ht 64.5 in | Wt 154.0 lb

## 2022-05-03 DIAGNOSIS — C3490 Malignant neoplasm of unspecified part of unspecified bronchus or lung: Secondary | ICD-10-CM

## 2022-05-03 DIAGNOSIS — Z87891 Personal history of nicotine dependence: Secondary | ICD-10-CM

## 2022-05-03 DIAGNOSIS — C3491 Malignant neoplasm of unspecified part of right bronchus or lung: Secondary | ICD-10-CM

## 2022-05-03 DIAGNOSIS — Z902 Acquired absence of lung [part of]: Secondary | ICD-10-CM

## 2022-05-03 DIAGNOSIS — R918 Other nonspecific abnormal finding of lung field: Secondary | ICD-10-CM | POA: Diagnosis not present

## 2022-05-03 DIAGNOSIS — J432 Centrilobular emphysema: Secondary | ICD-10-CM | POA: Diagnosis not present

## 2022-05-03 NOTE — Progress Notes (Signed)
Synopsis: Referred in Oct 2021 for lung nodule PCP by Marda Stalker, PA-C  Subjective:   PATIENT ID: Kimberly Beck GENDER: female DOB: [birth year], MRN: [id number]  Chief Complaint  Patient presents with   Follow-up    76 yo FM PMH HTN, DVT, RML GGO, with a semi-solid component concerning for malignancy, former smoker quit in 1987, 34 pack year history.  Patient is able to complete her activities of daily living.  She is not on any breathing treatments for respiratory complaints.  She has not had pulmonary function test in the past.  Here today for follow-up regarding CT scan imaging.  Initial imaging was in October 2020.  She had follow-up images in August 2021 which showed a right middle lobe groundglass nodule with a semisolid component that has more formed organization concerning for potential primary malignancy of the lung.  Patient denies fevers chills night sweats weight loss.  Or hemoptysis.  OV 05/20/2020: Patient seen today for follow-up after recent robotic lobectomy by Dr. Kipp Brood.  Patient was diagnosed with a stage T1 a adenocarcinoma.  Patient did well with surgery.  She still has some breathlessness associated with her exertional activity.  She is currently not on any inhalers.  She feels like she is slowly recovering back to her baseline.  She had follow-up with Dr. Kipp Brood on 05/07/2020.  Office note reviewed.  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  Case was discussed in medical thoracic oncology tumor board and no adjuvant chemotherapy is necessary.  She has a 1 month follow-up scheduled with Dr. Kipp Brood.  OV 08/16/2020: recent hiatal hernia repair by Dr. Kipp Brood. Doing well post-op from that.  Patient from a respiratory standpoint is doing well.  She has recovered nicely from both procedures.  She has her 49-month noncontrasted CT follow-up in April 2022 which will be 6 months from her surgical  lobectomy.  Patient denies fever chills night sweats weight loss.  She has not been using her inhalers.  As needed use of albuterol still.  She felt like the Stiolto made her cough and she did not notice much difference in her breathing.  OV 05/16/2021: Here today for follow-up for lung cancer.Patient had a CT scan of the chest in October 2022.  CT 04/15/2021 revealed no evidence of recurrent or metastatic disease stable postsurgical changes following a right middle lobectomy.  CT scan of the chest was reviewed today in the office with patient.  From respiratory standpoint she is doing well.  She does have some questions regarding her CT scan findings.  She has a few tree-in-bud opacities and we discussed the concept of chronic NTM which can be seen in the lung.  We also discussed coronary calcifications that were identified on CT imaging.  She plans to have follow-up scheduled with Dr. Gwenlyn Found her cardiologist.  Ok Edwards 05/03/2022: here today for follow up. She had ct follow up in march 2023 with [facility]. No evidence of recurrence of disease. The patient is doing well from a respiratory stand point.  Not currently using any inhalers.  She had CT follow-up in March 2023.  She needs a repeat 1 year CT scan.  This will be in March 2024.  We talked about this today in the office.    Past Medical History:  Diagnosis Date   Allergy    Anemia    Cancer ([place])    Complication of anesthesia    Depression  DVT, lower extremity, recurrent, right (HCC)    History of hiatal hernia    HTN (hypertension)    Lung cancer (HCC)    Right   Pneumonia    PONV (postoperative nausea and vomiting)    N/V only after receiving ether.   Pre-diabetes    BS checks daily, takes no medicines     Family History  Problem Relation Age of Onset   Heart attack Father    Stroke Maternal Grandfather      Past Surgical History:  Procedure Laterality Date   ESOPHAGOGASTRODUODENOSCOPY N/A 07/29/2020   Procedure:  ESOPHAGOGASTRODUODENOSCOPY (EGD);  Surgeon: Lajuana Matte, MD;  Location: [facility];  Service: Thoracic;  Laterality: N/A;   EYE SURGERY     Bilateral cataract removal   INTERCOSTAL NERVE BLOCK Right 04/29/2020   Procedure: INTERCOSTAL NERVE BLOCK;  Surgeon: Lajuana Matte, MD;  Location: [facility];  Service: Thoracic;  Laterality: Right;   LYMPH NODE DISSECTION Right 04/29/2020   Procedure: LYMPH NODE DISSECTION;  Surgeon: Lajuana Matte, MD;  Location: [facility];  Service: Thoracic;  Laterality: Right;   TONSILLECTOMY     TONSILLECTOMY AND ADENOIDECTOMY     TUBAL LIGATION     XI ROBOTIC ASSISTED PARAESOPHAGEAL HERNIA REPAIR N/A 07/29/2020   Procedure: XI ROBOTIC ASSISTED PARAESOPHAGEAL HERNIA REPAIR;  Surgeon: Lajuana Matte, MD;  Location: [facility];  Service: Thoracic;  Laterality: N/A;    Social History   Socioeconomic History   Marital status: Single    Spouse name: Not on file   Number of children: Not on file   Years of education: Not on file   Highest education level: Not on file  Occupational History   Not on file  Tobacco Use   Smoking status: Former    Packs/day: 3.00    Years: 25.00    Total pack years: 75.00    Types: Cigarettes    Start date: 74    Quit date: 1987    Years since quitting: 36.8   Smokeless tobacco: Never  Vaping Use   Vaping Use: Never used  Substance and Sexual Activity   Alcohol use: Never   Drug use: Never   Sexual activity: Not on file  Other Topics Concern   Not on file  Social History Narrative   Not on file   Social Determinants of Health   Financial Resource Strain: Not on file  Food Insecurity: Not on file  Transportation Needs: Not on file  Physical Activity: Not on file  Stress: Not on file  Social Connections: Not on file  Intimate Partner Violence: Not on file     Allergies  Allergen Reactions   Codeine Itching    Drunk feeling    Nsaids Other (See Comments)    Liver    Other     Other reaction(s):  diarrhea and headache Other reaction(s): Global anesthesia Other reaction(s): edema   Penicillins Rash    Dry rash     Outpatient Medications Prior to Visit  Medication Sig Dispense Refill   albuterol (VENTOLIN HFA) 108 (90 Base) MCG/ACT inhaler Inhale 2 puffs into the lungs every 6 (six) hours as needed for wheezing or shortness of breath. 8 g 6   Azelastine HCl 0.15 % SOLN Place 1 spray into both nostrils in the morning and at bedtime.     montelukast (SINGULAIR) 10 MG tablet Take 10 mg by mouth daily.     aspirin EC 81 MG tablet Take 81 mg by  mouth daily. Swallow whole.     Cholecalciferol (VITAMIN D) 50 MCG (2000 UT) CAPS Take 2,000 Units by mouth daily.     clonazePAM (KLONOPIN) 1 MG tablet 1 tablet     escitalopram (LEXAPRO) 10 MG tablet Take 10 mg by mouth daily.      lisinopril-hydrochlorothiazide (ZESTORETIC) 20-25 MG tablet Take 1 tablet by mouth daily.      metoprolol succinate (TOPROL-XL) 25 MG 24 hr tablet Take 1 tablet (25 mg total) by mouth daily. 100 tablet 1   metoprolol tartrate (LOPRESSOR) 100 MG tablet Take 1 tablet (100mg ) TWO hours prior to cardiac CT scan. (Patient not taking: Reported on 09/16/2021) 1 tablet 0   metoprolol tartrate (LOPRESSOR) 25 MG tablet Take 2 hours before CT scan. (Patient not taking: Reported on 09/16/2021) 1 tablet 0   Multiple Vitamin (MULTIVITAMIN) tablet Take 1 tablet by mouth daily.     oxybutynin (DITROPAN-XL) 5 MG 24 hr tablet Take 5 mg by mouth at bedtime. (Patient not taking: Reported on 07/07/2021)     pantoprazole (PROTONIX) 40 MG tablet Take 1 tablet (40 mg total) by mouth daily. 30 tablet 11   potassium chloride (KLOR-CON) 10 MEQ tablet Take 1 tablet (10 mEq total) by mouth daily. 90 tablet 3   rosuvastatin (CRESTOR) 5 MG tablet Take 1 tablet by mouth daily.     No facility-administered medications prior to visit.    Review of Systems  Constitutional:  Negative for chills, fever, malaise/fatigue and weight loss.  HENT:  Negative  for hearing loss, sore throat and tinnitus.   Eyes:  Negative for blurred vision and double vision.  Respiratory:  Negative for cough, hemoptysis, sputum production, shortness of breath, wheezing and stridor.   Cardiovascular:  Negative for chest pain, palpitations, orthopnea, leg swelling and PND.  Gastrointestinal:  Negative for abdominal pain, constipation, diarrhea, heartburn, nausea and vomiting.  Genitourinary:  Negative for dysuria, hematuria and urgency.  Musculoskeletal:  Negative for joint pain and myalgias.  Skin:  Negative for itching and rash.  Neurological:  Negative for dizziness, tingling, weakness and headaches.  Endo/Heme/Allergies:  Negative for environmental allergies. Does not bruise/bleed easily.  Psychiatric/Behavioral:  Negative for depression. The patient is not nervous/anxious and does not have insomnia.   All other systems reviewed and are negative.    Objective:  Physical Exam Vitals reviewed.  Constitutional:      General: She is not in acute distress.    Appearance: She is well-developed.  HENT:     Head: Normocephalic and atraumatic.  Eyes:     General: No scleral icterus.    Conjunctiva/sclera: Conjunctivae normal.     Pupils: Pupils are equal, round, and reactive to light.  Neck:     Vascular: No JVD.     Trachea: No tracheal deviation.  Cardiovascular:     Rate and Rhythm: Normal rate and regular rhythm.     Heart sounds: Normal heart sounds. No murmur heard. Pulmonary:     Effort: Pulmonary effort is normal. No tachypnea, accessory muscle usage or respiratory distress.     Breath sounds: No stridor. No wheezing, rhonchi or rales.  Abdominal:     General: There is no distension.     Palpations: Abdomen is soft.     Tenderness: There is no abdominal tenderness.  Musculoskeletal:        General: No tenderness.     Cervical back: Neck supple.  Lymphadenopathy:     Cervical: No cervical adenopathy.  Skin:  General: Skin is warm and dry.      Capillary Refill: Capillary refill takes less than 2 seconds.     Findings: No rash.  Neurological:     Mental Status: She is alert and oriented to person, place, and time.  Psychiatric:        Behavior: Behavior normal.      Vitals:   05/03/22 1411  BP: 130/80  Pulse: 65  SpO2: 97%  Weight: 154 lb (69.9 kg)  Height: 5' 4.5" (1.638 m)   97% on  BMI Readings from Last 3 Encounters:  05/03/22 26.03 kg/m  09/16/21 26.15 kg/m  07/07/21 26.28 kg/m   Wt Readings from Last 3 Encounters:  05/03/22 154 lb (69.9 kg)  09/16/21 162 lb (73.5 kg)  07/07/21 162 lb 12.8 oz (73.8 kg)     CBC    Component Value Date/Time   WBC 12.3 (H) 07/30/2020 0051   RBC 4.10 07/30/2020 0051   HGB 11.5 (L) 07/30/2020 0051   HCT 37.7 07/30/2020 0051   PLT 199 07/30/2020 0051   MCV 92.0 07/30/2020 0051   MCH 28.0 07/30/2020 0051   MCHC 30.5 07/30/2020 0051   RDW 15.5 07/30/2020 0051    Chest Imaging: October 2020 CT chest: Right middle lobe groundglass opacity adjacent to the fissure line of the posterior, lower lobe.  CT scan of the chest August 2021: Persistent right middle lobe groundglass opacity and, now more solid in appearance.  Also has curved edge of lobulation. This is concerning for primary bronchogenic carcinoma. The patient's images have been independently reviewed by me.    CT chest April 15, 2021: CT imaging reviewed.  No evidence of recurrent metastatic disease.  Right middle lobe postsurgical changes status post right middle lobe lobectomy.  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  Coronary calcifications The patient's images have been independently reviewed by me.    March CT chest 2023: No evidence of recurrence of disease.  The patient's images have been independently reviewed by me.      Pulmonary Functions Testing Results:    Latest Ref Rng & Units 04/16/2020   10:00 AM  PFT Results  FVC-Pre L 2.58   FVC-Predicted Pre % 84    FVC-Post L 2.48   FVC-Predicted Post % 81   Pre FEV1/FVC % % 74   Post FEV1/FCV % % 79   FEV1-Pre L 1.91   FEV1-Predicted Pre % 83   FEV1-Post L 1.96   DLCO uncorrected ml/min/mmHg 14.54   DLCO UNC% % 72   DLCO corrected ml/min/mmHg 14.54   DLCO COR %Predicted % 72   DLVA Predicted % 90     FeNO:   Pathology:   FINAL MICROSCOPIC DIAGNOSIS:   A. LUNG, RIGHT MIDDLE LOBE, LOBECTOMY:  - Adenocarcinoma, 0.6 cm.  - Margins not involved.  - Tumor involves subpleural connective tissue.  - Peribronchial lymph node with no metastatic carcinoma (0/1).   B. LYMPH NODE, HILAR, EXCISION:  - Anthracotic lymph node with no metastatic carcinoma (0/1).   C. LYMPH NODE, HILAR #2, EXCISION:  - Anthracotic lymph node with no metastatic carcinoma (0/1).   D. LYMPH NODE, HILAR #3, EXCISION:  - Anthracotic lymph node with no metastatic carcinoma (0/1).   E. LYMPH NODE, HILAR #4, EXCISION:  - Anthracotic lymph node with no metastatic carcinoma (0/1).   F. LYMPH NODE, 9, EXCISION:  - Anthracotic lymph node with no metastatic carcinoma (0/1).   G. LYMPH NODE, 7, EXCISION:  -  Anthracotic lymph node with no metastatic carcinoma (0/1).   H. LYMPH NODE, 7 #2, EXCISION:  - Anthracotic lymph node with no metastatic carcinoma (0/1).   Echocardiogram:   Heart Catheterization:      Assessment & Plan:     ICD-10-CM   1. Malignant neoplasm of unspecified part of unspecified bronchus or lung (HCC)  C34.90 CT Chest Wo Contrast    2. Adenocarcinoma, lung, right (HCC)  C34.91     3. Centrilobular emphysema ([facility])  J43.2     4. Former smoker  Z87.891     5. S/P lobectomy of lung  Z90.2     6. Ground glass opacity present on imaging of lung  R91.8       Assessment:   This is a 76 year old female initially presented with a persistent semisolid groundglass opacity nodule within the right middle lobe status post right middle lobectomy by Dr. Kipp Brood positive for adenocarcinoma of the right  lung.  Has done well post resection.  No evidence of recurrence of disease.  Plan: Noncontrasted CT scan of the chest in March 2024. He should have follow-up with Dr. Kipp Brood at that time as well. She will then see me 1 year. She is currently just using albuterol as needed. Not currently on a maintenance inhaler. She may benefit from consideration of maintenance [place] if she develops more respiratory symptoms. But she is not interested in being on a maintenance inhaler at this time. Follow-up with me in clinic in one year  [place] for covid booster and RSV     Current Outpatient Medications:    albuterol (VENTOLIN HFA) 108 (90 Base) MCG/ACT inhaler, Inhale 2 puffs into the lungs every 6 (six) hours as needed for wheezing or shortness of breath., Disp: 8 g, Rfl: 6   Azelastine HCl 0.15 % SOLN, Place 1 spray into both nostrils in the morning and at bedtime., Disp: , Rfl:    montelukast (SINGULAIR) 10 MG tablet, Take 10 mg by mouth daily., Disp: , Rfl:    aspirin EC 81 MG tablet, Take 81 mg by mouth daily. Swallow whole., Disp: , Rfl:    Cholecalciferol (VITAMIN D) 50 MCG (2000 UT) CAPS, Take 2,000 Units by mouth daily., Disp: , Rfl:    clonazePAM (KLONOPIN) 1 MG tablet, 1 tablet, Disp: , Rfl:    escitalopram (LEXAPRO) 10 MG tablet, Take 10 mg by mouth daily. , Disp: , Rfl:    lisinopril-hydrochlorothiazide (ZESTORETIC) 20-25 MG tablet, Take 1 tablet by mouth daily. , Disp: , Rfl:    metoprolol succinate (TOPROL-XL) 25 MG 24 hr tablet, Take 1 tablet (25 mg total) by mouth daily., Disp: 100 tablet, Rfl: 1   metoprolol tartrate (LOPRESSOR) 100 MG tablet, Take 1 tablet (100mg ) TWO hours prior to cardiac CT scan. (Patient not taking: Reported on 09/16/2021), Disp: 1 tablet, Rfl: 0   metoprolol tartrate (LOPRESSOR) 25 MG tablet, Take 2 hours before CT scan. (Patient not taking: Reported on 09/16/2021), Disp: 1 tablet, Rfl: 0   Multiple Vitamin (MULTIVITAMIN) tablet, Take 1 tablet by mouth daily.,  Disp: , Rfl:    oxybutynin (DITROPAN-XL) 5 MG 24 hr tablet, Take 5 mg by mouth at bedtime. (Patient not taking: Reported on 07/07/2021), Disp: , Rfl:    pantoprazole (PROTONIX) 40 MG tablet, Take 1 tablet (40 mg total) by mouth daily., Disp: 30 tablet, Rfl: 11   potassium chloride (KLOR-CON) 10 MEQ tablet, Take 1 tablet (10 mEq total) by mouth daily., Disp: 90 tablet, Rfl: 3  rosuvastatin (CRESTOR) 5 MG tablet, Take 1 tablet by mouth daily., Disp: , Rfl:    Garner Nash, DO [facility] 05/03/2022 3:01 PM

## 2022-05-03 NOTE — Patient Instructions (Signed)
Thank you for visiting Dr. Valeta Harms at [facility]. Today we recommend the following:  Orders Placed This Encounter  Procedures   CT Chest Wo Contrast   CT Chest in March 2024  Return in about 1 year (around 05/04/2023) for with Eric Form, NP, or Dr. Valeta Harms.    Please do your part to reduce the spread of COVID-19.

## 2022-06-14 DIAGNOSIS — H903 Sensorineural hearing loss, bilateral: Secondary | ICD-10-CM | POA: Diagnosis not present

## 2022-07-29 ENCOUNTER — Other Ambulatory Visit: Payer: Self-pay | Admitting: Cardiovascular Disease

## 2022-08-09 ENCOUNTER — Other Ambulatory Visit: Payer: Self-pay | Admitting: Physician Assistant

## 2022-08-10 ENCOUNTER — Encounter (HOSPITAL_COMMUNITY): Payer: Self-pay | Admitting: *Deleted

## 2022-08-15 DIAGNOSIS — D649 Anemia, unspecified: Secondary | ICD-10-CM | POA: Diagnosis not present

## 2022-08-15 DIAGNOSIS — M810 Age-related osteoporosis without current pathological fracture: Secondary | ICD-10-CM | POA: Diagnosis not present

## 2022-08-15 DIAGNOSIS — N1831 Chronic kidney disease, stage 3a: Secondary | ICD-10-CM | POA: Diagnosis not present

## 2022-08-15 DIAGNOSIS — M8580 Other specified disorders of bone density and structure, unspecified site: Secondary | ICD-10-CM | POA: Diagnosis not present

## 2022-08-15 DIAGNOSIS — I10 Essential (primary) hypertension: Secondary | ICD-10-CM | POA: Diagnosis not present

## 2022-08-15 DIAGNOSIS — K219 Gastro-esophageal reflux disease without esophagitis: Secondary | ICD-10-CM | POA: Diagnosis not present

## 2022-08-29 ENCOUNTER — Other Ambulatory Visit: Payer: Self-pay | Admitting: Cardiovascular Disease

## 2022-09-12 ENCOUNTER — Ambulatory Visit
Admission: RE | Admit: 2022-09-12 | Discharge: 2022-09-12 | Disposition: A | Discharge status: Home / Self Care | Payer: Medicare Other | Source: Ambulatory Visit | Attending: Pulmonary Disease | Admitting: Pulmonary Disease

## 2022-09-12 DIAGNOSIS — J439 Emphysema, unspecified: Secondary | ICD-10-CM | POA: Diagnosis not present

## 2022-09-12 DIAGNOSIS — I700 Atherosclerosis of aorta: Secondary | ICD-10-CM | POA: Diagnosis not present

## 2022-09-12 DIAGNOSIS — C3490 Malignant neoplasm of unspecified part of unspecified bronchus or lung: Secondary | ICD-10-CM | POA: Diagnosis not present

## 2022-09-19 ENCOUNTER — Other Ambulatory Visit: Payer: Self-pay | Admitting: Thoracic Surgery (Cardiothoracic Vascular Surgery)

## 2022-09-22 ENCOUNTER — Ambulatory Visit: Payer: Medicare Other | Admitting: Thoracic Surgery (Cardiothoracic Vascular Surgery)

## 2022-09-22 VITALS — BP 106/72 | HR 99 | Resp 20 | Ht 64.0 in | Wt 151.0 lb

## 2022-09-22 DIAGNOSIS — Z85118 Personal history of other malignant neoplasm of bronchus and lung: Secondary | ICD-10-CM | POA: Diagnosis not present

## 2022-09-22 NOTE — Progress Notes (Signed)
      [address]       [place] [address]             [phone]        Keiona P Leyva Brisbin Medical Record [id number] Date of Birth: 09/12/1945  Referring: Lorretta Harp, MD Primary Care: Marda Stalker, PA-C Primary Cardiologist:None  Reason for visit:   follow-up  History of Present Illness:     77yo female presents for her annual surveillance scan.  Overall, she is doing well.  She occasionally has some reflux 2-3/month for which she takes tums on top of her protonix with good relief.  She denies any dysphagia or odynophagia.  Physical Exam: BP 106/72 (BP Location: Right Arm, Patient Position: Sitting)   Pulse 99   Resp 20   Ht 5\' 4"  (1.626 m)   Wt 151 lb (68.5 kg)   SpO2 98% Comment: RA  BMI 25.92 kg/m   Alert NAD Abdomen, ND no peripheral edema   Diagnostic Studies & Laboratory data: CT chest: IMPRESSION: 1. Enlarging band like density in the superior segment of the right lower lobe, still felt to most likely reflect scarring based on appearance. Given progression, recommend chest CT follow-up in 6 months. 2. Otherwise stable postoperative chest status post right middle lobectomy. No evidence of local recurrence or metastatic disease. 3. Coronary and aortic Atherosclerosis (ICD10-I70.0). Emphysema (ICD10-J43.9).    Assessment / Plan:   77 year old female status post right middle lobectomy for stage I adenocarcinoma October 2021 status post paraesophageal hernia repair with mild recurrence. In regards to the hiatal hernia she is minimally symptomatic and does not want to do anything about this. The band-like density appears to be enlarging, but is likely related to the staple line.  She will undergo a PET/CT in 80months for further evaluation.  Kaesha Kirsch O Nikan Ellingson 09/22/2022 10:14 AM

## 2022-09-22 NOTE — Progress Notes (Signed)
CT results reviewed with patient today in the office with Dr. Kipp Brood.  Thanks,  BLI  Garner Nash, DO [facility] 09/22/2022 3:36 PM

## 2022-10-16 ENCOUNTER — Other Ambulatory Visit: Payer: Self-pay | Admitting: Thoracic Surgery (Cardiothoracic Vascular Surgery)

## 2022-11-07 ENCOUNTER — Encounter: Payer: Self-pay | Admitting: Cardiovascular Disease

## 2022-11-07 ENCOUNTER — Ambulatory Visit: Payer: Medicare Other | Attending: Cardiovascular Disease | Admitting: Cardiovascular Disease

## 2022-11-07 VITALS — BP 118/74 | HR 69 | Ht 66.0 in | Wt 153.2 lb

## 2022-11-07 DIAGNOSIS — E782 Mixed hyperlipidemia: Secondary | ICD-10-CM | POA: Diagnosis not present

## 2022-11-07 DIAGNOSIS — I10 Essential (primary) hypertension: Secondary | ICD-10-CM | POA: Diagnosis not present

## 2022-11-07 DIAGNOSIS — R931 Abnormal findings on diagnostic imaging of heart and coronary circulation: Secondary | ICD-10-CM | POA: Diagnosis not present

## 2022-11-07 DIAGNOSIS — R000 Tachycardia, unspecified: Secondary | ICD-10-CM

## 2022-11-07 NOTE — Assessment & Plan Note (Signed)
History of essential hypertension the blood pressure measured today at 118/74.  She is on metoprolol and lisinopril/hydrochlorothiazide.

## 2022-11-07 NOTE — Assessment & Plan Note (Signed)
History of moderately elevated coronary calcium score of 238 with mild nonobstructive CAD performed 06/14/2021.  Chest pain.

## 2022-11-07 NOTE — Assessment & Plan Note (Signed)
History of sinus tachycardia controlled on low-dose beta-blocker.  Heart rate today is 69.

## 2022-11-07 NOTE — Progress Notes (Signed)
11/07/2022 Kimberly Beck   11-13-1945  [id number]  Primary Physician Jarrett Soho, PA-C Primary Cardiologist: Runell Gess MD FACP, [place], [place], [place]  HPI:  Kimberly Beck is a 77 y.o.  mildly overweight separated Caucasian female with no children referred by Dr. Cliffton Asters for preoperative clearance before robotic paraesophageal hernia repair of a large hiatal hernia.    I last saw her in the office 07/07/2021. 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  She smoked remotely and stopped in 1987.  She has treated hypertension.  Her father did die of a myocardial infarction at age 49.  She is never had a heart or stroke.  She denies chest pain or shortness of breath.  She had resting tachycardia for an unknown known period of time although she is unaware of this.  TSH is normal.  This was reviewed by Rudi Coco, FNP for concern that it was A. fib but it was sinus tach with PACs.   She underwent successful robotic para esophageal hernia repair 07/29/2020 by Dr. Cliffton Asters.  She feels clinically improved.  Her heart rate has been better controlled on beta-blocker.     Since I saw her in the face over a year ago she continues to do well.  She denies chest pain or shortness of breath.  She did have a normal 2D echo in the past and a coronary calcium score performed 06/16/2021 which was 238 with nonobstructive CAD.   Current Meds  Medication Sig   albuterol (VENTOLIN HFA) 108 (90 Base) MCG/ACT inhaler Inhale 2 puffs into the lungs every 6 (six) hours as needed for wheezing or shortness of breath.   aspirin EC 81 MG tablet Take 81 mg by mouth daily. Swallow whole.   Azelastine HCl 0.15 % SOLN Place 1 spray into both nostrils in the morning and at bedtime.   calcium citrate (CALCITRATE - DOSED IN MG ELEMENTAL CALCIUM) 950 (200 Ca) MG tablet Take 200 mg of elemental calcium by mouth daily.   Cholecalciferol (VITAMIN D) 50 MCG (2000 UT) CAPS Take 2,000 Units by  mouth daily.   clonazePAM (KLONOPIN) 1 MG tablet 1 tablet   escitalopram (LEXAPRO) 10 MG tablet Take 10 mg by mouth daily.    lisinopril-hydrochlorothiazide (ZESTORETIC) 20-25 MG tablet Take 1 tablet by mouth daily.    metoprolol succinate (TOPROL-XL) 25 MG 24 hr tablet TAKE 1 TABLET BY MOUTH DAILY   montelukast (SINGULAIR) 10 MG tablet Take 10 mg by mouth daily.   Multiple Vitamin (MULTIVITAMIN) tablet Take 1 tablet by mouth daily.   pantoprazole (PROTONIX) 40 MG tablet Take 1 tablet (40 mg total) by mouth daily.   rosuvastatin (CRESTOR) 5 MG tablet Take 1 tablet by mouth daily.     Allergies  Allergen Reactions   Codeine Itching    Drunk feeling    Nsaids Other (See Comments)    Liver    Other     Other reaction(s): diarrhea and headache Other reaction(s): Global anesthesia Other reaction(s): edema   Penicillins Rash    Dry rash    Social History   Socioeconomic History   Marital status: Single    Spouse name: Not on file   Number of children: Not on file   Years of education: Not on file   Highest education level: Not on file  Occupational History   Not on file  Tobacco Use   Smoking status: Former    Packs/day: 3.00  Years: 25.00    Additional pack years: 0.00    Total pack years: 75.00    Types: Cigarettes    Start date: 47    Quit date: 58    Years since quitting: 37.3   Smokeless tobacco: Never  Vaping Use   Vaping Use: Never used  Substance and Sexual Activity   Alcohol use: Never   Drug use: Never   Sexual activity: Not on file  Other Topics Concern   Not on file  Social History Narrative   Not on file   Social Determinants of Health   Financial Resource Strain: Not on file  Food Insecurity: Not on file  Transportation Needs: Not on file  Physical Activity: Not on file  Stress: Not on file  Social Connections: Not on file  Intimate Partner Violence: Not on file     Review of Systems: General: negative for chills, fever, night sweats  or weight changes.  Cardiovascular: negative for chest pain, dyspnea on exertion, edema, orthopnea, palpitations, paroxysmal nocturnal dyspnea or shortness of breath Dermatological: negative for rash Respiratory: negative for cough or wheezing Urologic: negative for hematuria Abdominal: negative for nausea, vomiting, diarrhea, bright red blood per rectum, melena, or hematemesis Neurologic: negative for visual changes, syncope, or dizziness All other systems reviewed and are otherwise negative except as noted above.    Blood pressure 118/74, pulse 69, height 5\' 6"  (1.676 m), weight 153 lb 3.2 oz (69.5 kg), SpO2 97 %.  General appearance: alert and no distress Neck: no adenopathy, no carotid bruit, no JVD, supple, symmetrical, trachea midline, and thyroid not enlarged, symmetric, no tenderness/mass/nodules Lungs: clear to auscultation bilaterally Heart: regular rate and rhythm, S1, S2 normal, no murmur, click, rub or gallop Extremities: extremities normal, atraumatic, no cyanosis or edema Pulses: 2+ and symmetric Skin: Skin color, texture, turgor normal. No rashes or lesions Neurologic: Grossly normal  EKG sinus rhythm at 69 with incomplete right bundle branch block new since prior tracing.  I personally reviewed this EKG.  ASSESSMENT AND PLAN:   Essential hypertension History of essential hypertension the blood pressure measured today at 118/74.  She is on metoprolol and lisinopril/hydrochlorothiazide.  Sinus tachycardia History of sinus tachycardia controlled on low-dose beta-blocker.  Heart rate today is 69.  Hyperlipidemia History of hyperlipidemia on low-dose Crestor with lipid profile performed 07/07/2021 revealing total cholesterol 156, LDL 76 and HDL of 62.  We will repeat a fasting lipid liver profile.  Elevated coronary artery calcium score History of moderately elevated coronary calcium score of 238 with mild nonobstructive CAD performed 06/14/2021.  Chest  pain.     Runell Gess MD FACP,FACC,FAHA, [facility] 11/07/2022 3:57 PM

## 2022-11-07 NOTE — Patient Instructions (Signed)
Medication Instructions:  Your physician recommends that you continue on your current medications as directed. Please refer to the Current Medication list given to you today.  *If you need a refill on your cardiac medications before your next appointment, please call your pharmacy*   Lab Work: Your physician recommends that you return for lab work in: the next week or 2 for FASTING lipid/liver   If you have labs (blood work) drawn today and your tests are completely normal, you will receive your results only by: MyChart Message (if you have MyChart) OR A paper copy in the mail If you have any lab test that is abnormal or we need to change your treatment, we will call you to review the results.   Follow-Up: At [facility], you and your health needs are our priority.  As part of our continuing mission to provide you with exceptional heart care, we have created designated Provider Care Teams.  These Care Teams include your primary Cardiologist (physician) and Advanced Practice Providers (APPs -  Physician Assistants and Nurse Practitioners) who all work together to provide you with the care you need, when you need it.  We recommend signing up for the patient portal called "MyChart".  Sign up information is provided on this After Visit Summary.  MyChart is used to connect with patients for Virtual Visits (Telemedicine).  Patients are able to view lab/test results, encounter notes, upcoming appointments, etc.  Non-urgent messages can be sent to your provider as well.   To learn more about what you can do with MyChart, go to [email or web address].    Your next appointment:   12 month(s)  Provider:   Nanetta Batty, MD

## 2022-11-07 NOTE — Assessment & Plan Note (Signed)
History of hyperlipidemia on low-dose Crestor with lipid profile performed 07/07/2021 revealing total cholesterol 156, LDL 76 and HDL of 62.  We will repeat a fasting lipid liver profile.

## 2022-11-20 DIAGNOSIS — I10 Essential (primary) hypertension: Secondary | ICD-10-CM | POA: Diagnosis not present

## 2022-11-20 DIAGNOSIS — R931 Abnormal findings on diagnostic imaging of heart and coronary circulation: Secondary | ICD-10-CM | POA: Diagnosis not present

## 2022-11-20 DIAGNOSIS — E782 Mixed hyperlipidemia: Secondary | ICD-10-CM | POA: Diagnosis not present

## 2022-11-21 LAB — HEPATIC FUNCTION PANEL
ALT: 13 IU/L (ref 0–32)
AST: 20 IU/L (ref 0–40)
Albumin: 4.1 g/dL (ref 3.8–4.8)
Alkaline Phosphatase: 76 IU/L (ref 44–121)
Bilirubin Total: 0.6 mg/dL (ref 0.0–1.2)
Bilirubin, Direct: 0.16 mg/dL (ref 0.00–0.40)
Total Protein: 6.2 g/dL (ref 6.0–8.5)

## 2022-11-21 LAB — LIPID PANEL
Chol/HDL Ratio: 2.5 ratio (ref 0.0–4.4)
Cholesterol, Total: 162 mg/dL (ref 100–199)
HDL: 65 mg/dL (ref >39)
LDL Chol Calc (NIH): 75 mg/dL (ref 0–99)
Triglycerides: 128 mg/dL (ref 0–149)
VLDL Cholesterol Cal: 22 mg/dL (ref 5–40)

## 2022-11-26 ENCOUNTER — Other Ambulatory Visit: Payer: Self-pay | Admitting: Cardiovascular Disease

## 2023-01-21 ENCOUNTER — Other Ambulatory Visit: Payer: Self-pay | Admitting: Thoracic Surgery (Cardiothoracic Vascular Surgery)

## 2023-02-12 ENCOUNTER — Other Ambulatory Visit: Payer: Self-pay | Admitting: Thoracic Surgery (Cardiothoracic Vascular Surgery)

## 2023-02-12 DIAGNOSIS — Z85118 Personal history of other malignant neoplasm of bronchus and lung: Secondary | ICD-10-CM

## 2023-03-02 ENCOUNTER — Encounter (HOSPITAL_COMMUNITY): Admission: RE | Admit: 2023-03-02 | Payer: Medicare Other | Source: Ambulatory Visit

## 2023-03-02 DIAGNOSIS — R918 Other nonspecific abnormal finding of lung field: Secondary | ICD-10-CM | POA: Diagnosis not present

## 2023-03-02 DIAGNOSIS — Z85118 Personal history of other malignant neoplasm of bronchus and lung: Secondary | ICD-10-CM | POA: Insufficient documentation

## 2023-03-02 DIAGNOSIS — C3490 Malignant neoplasm of unspecified part of unspecified bronchus or lung: Secondary | ICD-10-CM | POA: Diagnosis not present

## 2023-03-02 LAB — GLUCOSE, CAPILLARY: Glucose-Capillary: 133 mg/dL — ABNORMAL HIGH (ref 70–99)

## 2023-03-02 MED ORDER — FLUDEOXYGLUCOSE F - 18 (FDG) INJECTION
7.7000 | Freq: Once | INTRAVENOUS | Status: AC | PRN
  Administered 2023-03-02: 7.6 via INTRAVENOUS

## 2023-03-12 ENCOUNTER — Other Ambulatory Visit: Payer: Self-pay | Admitting: Family Medicine

## 2023-03-12 DIAGNOSIS — Z23 Encounter for immunization: Secondary | ICD-10-CM | POA: Diagnosis not present

## 2023-03-12 DIAGNOSIS — Z0000 Encounter for general adult medical examination without abnormal findings: Secondary | ICD-10-CM | POA: Diagnosis not present

## 2023-03-12 DIAGNOSIS — Z1231 Encounter for screening mammogram for malignant neoplasm of breast: Secondary | ICD-10-CM

## 2023-03-16 ENCOUNTER — Ambulatory Visit (INDEPENDENT_AMBULATORY_CARE_PROVIDER_SITE_OTHER): Payer: Medicare Other | Admitting: Thoracic Surgery (Cardiothoracic Vascular Surgery)

## 2023-03-16 DIAGNOSIS — I10 Essential (primary) hypertension: Secondary | ICD-10-CM | POA: Diagnosis not present

## 2023-03-16 DIAGNOSIS — M810 Age-related osteoporosis without current pathological fracture: Secondary | ICD-10-CM | POA: Diagnosis not present

## 2023-03-16 DIAGNOSIS — Z9889 Other specified postprocedural states: Secondary | ICD-10-CM

## 2023-03-16 DIAGNOSIS — N189 Chronic kidney disease, unspecified: Secondary | ICD-10-CM | POA: Diagnosis not present

## 2023-03-16 DIAGNOSIS — R7301 Impaired fasting glucose: Secondary | ICD-10-CM | POA: Diagnosis not present

## 2023-03-16 DIAGNOSIS — E559 Vitamin D deficiency, unspecified: Secondary | ICD-10-CM | POA: Diagnosis not present

## 2023-03-16 DIAGNOSIS — I700 Atherosclerosis of aorta: Secondary | ICD-10-CM | POA: Diagnosis not present

## 2023-03-16 DIAGNOSIS — J432 Centrilobular emphysema: Secondary | ICD-10-CM | POA: Diagnosis not present

## 2023-03-16 DIAGNOSIS — Z902 Acquired absence of lung [part of]: Secondary | ICD-10-CM

## 2023-03-16 DIAGNOSIS — C3491 Malignant neoplasm of unspecified part of right bronchus or lung: Secondary | ICD-10-CM | POA: Diagnosis not present

## 2023-03-16 DIAGNOSIS — Z8719 Personal history of other diseases of the digestive system: Secondary | ICD-10-CM

## 2023-03-16 DIAGNOSIS — M6281 Muscle weakness (generalized): Secondary | ICD-10-CM | POA: Diagnosis not present

## 2023-03-16 DIAGNOSIS — J309 Allergic rhinitis, unspecified: Secondary | ICD-10-CM | POA: Diagnosis not present

## 2023-03-16 MED ORDER — PANTOPRAZOLE SODIUM 40 MG PO TBEC: 40.0000 mg | DELAYED_RELEASE_TABLET | Freq: Every day | ORAL | 11 refills | Status: DC

## 2023-03-16 NOTE — Progress Notes (Signed)
     [address]       Kimberly Beck [address]             [phone]       Patient: Home Provider: Office Consent for Telemedicine visit obtained.  Today's visit was completed via a real-time telehealth (see specific modality noted below). The patient/authorized person provided oral consent at the time of the visit to engage in a telemedicine encounter with the present provider at [facility]. The patient/authorized person was informed of the potential benefits, limitations, and risks of telemedicine. The patient/authorized person expressed understanding that the laws that protect confidentiality also apply to telemedicine. The patient/authorized person acknowledged understanding that telemedicine does not provide emergency services and that he or she would need to call 911 or proceed to the nearest hospital for help if such a need arose.   Total time spent in the clinical discussion 10 minutes.  Telehealth Modality: Phone visit (audio only)  I had a telephone visit with Mrs. Kimberly Beck.  She is doing well overall.  She only has occasional reflux symptoms at night which are relieved with tums.  She denies any other issues.  I reviewed her PET/CT, and everything appears stable.  The 0.7cm nodule does not have any metabolic activity.  She will follow-up in 1 yr with another CT chest.  He original RMLectomy was in 2021.     IMPRESSION: 1. No evidence of hypermetabolic metastatic disease. 2. Peripheral superior segment right lower lobe 0.7 cm pulmonary nodule demonstrates no significant FDG uptake, technically below PET resolution, unchanged in size from 09/12/2022 chest CT using similar measurement technique, favoring a benign nodule. Suggest continued attention on chest CT surveillance. 3. Simple 1.9 cm right adnexal cyst, mildly increased from 1.3 cm on 04/20/2020 PET-CT. No follow-up imaging recommended. Note: This recommendation does not apply to premenarchal patients and to  those with increased risk (genetic, family history, elevated tumor markers or other high-risk factors) of ovarian cancer. Reference: JACR 2020 Feb; 17(2):248-254 4. Moderate hiatal hernia. 5. Coronary atherosclerosis. 6.  Aortic Atherosclerosis (ICD10-I70.0).

## 2023-03-29 ENCOUNTER — Ambulatory Visit
Admission: RE | Admit: 2023-03-29 | Discharge: 2023-03-29 | Disposition: A | Discharge status: Home / Self Care | Payer: Medicare Other | Source: Ambulatory Visit | Attending: Family Medicine | Admitting: Family Medicine

## 2023-03-29 DIAGNOSIS — Z1231 Encounter for screening mammogram for malignant neoplasm of breast: Secondary | ICD-10-CM | POA: Diagnosis not present

## 2023-04-10 DIAGNOSIS — M6281 Muscle weakness (generalized): Secondary | ICD-10-CM | POA: Diagnosis not present

## 2023-04-12 DIAGNOSIS — N289 Disorder of kidney and ureter, unspecified: Secondary | ICD-10-CM | POA: Diagnosis not present

## 2023-04-16 DIAGNOSIS — M6281 Muscle weakness (generalized): Secondary | ICD-10-CM | POA: Diagnosis not present

## 2023-04-19 DIAGNOSIS — M6281 Muscle weakness (generalized): Secondary | ICD-10-CM | POA: Diagnosis not present

## 2023-04-23 DIAGNOSIS — M6281 Muscle weakness (generalized): Secondary | ICD-10-CM | POA: Diagnosis not present

## 2023-04-26 DIAGNOSIS — M6281 Muscle weakness (generalized): Secondary | ICD-10-CM | POA: Diagnosis not present

## 2023-04-30 DIAGNOSIS — M6281 Muscle weakness (generalized): Secondary | ICD-10-CM | POA: Diagnosis not present

## 2023-05-03 DIAGNOSIS — M6281 Muscle weakness (generalized): Secondary | ICD-10-CM | POA: Diagnosis not present

## 2023-05-07 DIAGNOSIS — M6281 Muscle weakness (generalized): Secondary | ICD-10-CM | POA: Diagnosis not present

## 2023-05-10 DIAGNOSIS — M6281 Muscle weakness (generalized): Secondary | ICD-10-CM | POA: Diagnosis not present

## 2023-05-14 ENCOUNTER — Other Ambulatory Visit: Payer: Self-pay

## 2023-05-14 ENCOUNTER — Emergency Department (HOSPITAL_COMMUNITY): Payer: Medicare Other

## 2023-05-14 ENCOUNTER — Ambulatory Visit
Admission: EM | Admit: 2023-05-14 | Discharge: 2023-05-14 | Disposition: A | Discharge status: Home / Self Care | Payer: Medicare Other

## 2023-05-14 ENCOUNTER — Emergency Department (HOSPITAL_COMMUNITY)
Admission: EM | Admit: 2023-05-14 | Discharge: 2023-05-15 | Disposition: A | Discharge status: Home / Self Care | Payer: Medicare Other | Source: Home / Self Care | Attending: Emergency Medicine | Admitting: Emergency Medicine

## 2023-05-14 ENCOUNTER — Encounter (HOSPITAL_COMMUNITY): Payer: Self-pay | Admitting: Emergency Medicine

## 2023-05-14 DIAGNOSIS — S81011A Laceration without foreign body, right knee, initial encounter: Secondary | ICD-10-CM | POA: Diagnosis not present

## 2023-05-14 DIAGNOSIS — R7303 Prediabetes: Secondary | ICD-10-CM | POA: Diagnosis not present

## 2023-05-14 DIAGNOSIS — S42211A Unspecified displaced fracture of surgical neck of right humerus, initial encounter for closed fracture: Secondary | ICD-10-CM | POA: Insufficient documentation

## 2023-05-14 DIAGNOSIS — M25511 Pain in right shoulder: Secondary | ICD-10-CM | POA: Diagnosis not present

## 2023-05-14 DIAGNOSIS — K5730 Diverticulosis of large intestine without perforation or abscess without bleeding: Secondary | ICD-10-CM | POA: Diagnosis not present

## 2023-05-14 DIAGNOSIS — Z888 Allergy status to other drugs, medicaments and biological substances status: Secondary | ICD-10-CM | POA: Diagnosis not present

## 2023-05-14 DIAGNOSIS — Z86718 Personal history of other venous thrombosis and embolism: Secondary | ICD-10-CM | POA: Diagnosis not present

## 2023-05-14 DIAGNOSIS — Z885 Allergy status to narcotic agent status: Secondary | ICD-10-CM | POA: Diagnosis not present

## 2023-05-14 DIAGNOSIS — E860 Dehydration: Secondary | ICD-10-CM | POA: Diagnosis not present

## 2023-05-14 DIAGNOSIS — M4802 Spinal stenosis, cervical region: Secondary | ICD-10-CM | POA: Diagnosis not present

## 2023-05-14 DIAGNOSIS — Z85118 Personal history of other malignant neoplasm of bronchus and lung: Secondary | ICD-10-CM | POA: Diagnosis not present

## 2023-05-14 DIAGNOSIS — A0472 Enterocolitis due to Clostridium difficile, not specified as recurrent: Secondary | ICD-10-CM | POA: Diagnosis not present

## 2023-05-14 DIAGNOSIS — R531 Weakness: Secondary | ICD-10-CM | POA: Diagnosis not present

## 2023-05-14 DIAGNOSIS — W010XXA Fall on same level from slipping, tripping and stumbling without subsequent striking against object, initial encounter: Secondary | ICD-10-CM | POA: Insufficient documentation

## 2023-05-14 DIAGNOSIS — S42201A Unspecified fracture of upper end of right humerus, initial encounter for closed fracture: Secondary | ICD-10-CM | POA: Diagnosis not present

## 2023-05-14 DIAGNOSIS — M545 Low back pain, unspecified: Secondary | ICD-10-CM | POA: Diagnosis not present

## 2023-05-14 DIAGNOSIS — S0990XA Unspecified injury of head, initial encounter: Secondary | ICD-10-CM

## 2023-05-14 DIAGNOSIS — Z043 Encounter for examination and observation following other accident: Secondary | ICD-10-CM | POA: Diagnosis not present

## 2023-05-14 DIAGNOSIS — Z79899 Other long term (current) drug therapy: Secondary | ICD-10-CM | POA: Diagnosis not present

## 2023-05-14 DIAGNOSIS — G9389 Other specified disorders of brain: Secondary | ICD-10-CM | POA: Diagnosis not present

## 2023-05-14 DIAGNOSIS — E042 Nontoxic multinodular goiter: Secondary | ICD-10-CM | POA: Diagnosis not present

## 2023-05-14 DIAGNOSIS — M47812 Spondylosis without myelopathy or radiculopathy, cervical region: Secondary | ICD-10-CM | POA: Diagnosis not present

## 2023-05-14 DIAGNOSIS — E876 Hypokalemia: Secondary | ICD-10-CM | POA: Diagnosis not present

## 2023-05-14 DIAGNOSIS — D649 Anemia, unspecified: Secondary | ICD-10-CM | POA: Diagnosis not present

## 2023-05-14 DIAGNOSIS — Z7982 Long term (current) use of aspirin: Secondary | ICD-10-CM | POA: Insufficient documentation

## 2023-05-14 DIAGNOSIS — S129XXA Fracture of neck, unspecified, initial encounter: Secondary | ICD-10-CM | POA: Diagnosis not present

## 2023-05-14 DIAGNOSIS — M79606 Pain in leg, unspecified: Secondary | ICD-10-CM | POA: Diagnosis not present

## 2023-05-14 DIAGNOSIS — N179 Acute kidney failure, unspecified: Secondary | ICD-10-CM | POA: Diagnosis not present

## 2023-05-14 DIAGNOSIS — G8929 Other chronic pain: Secondary | ICD-10-CM | POA: Diagnosis not present

## 2023-05-14 DIAGNOSIS — Z823 Family history of stroke: Secondary | ICD-10-CM | POA: Diagnosis not present

## 2023-05-14 DIAGNOSIS — R112 Nausea with vomiting, unspecified: Secondary | ICD-10-CM | POA: Diagnosis not present

## 2023-05-14 DIAGNOSIS — W19XXXA Unspecified fall, initial encounter: Secondary | ICD-10-CM

## 2023-05-14 DIAGNOSIS — R519 Headache, unspecified: Secondary | ICD-10-CM | POA: Diagnosis not present

## 2023-05-14 DIAGNOSIS — M25561 Pain in right knee: Secondary | ICD-10-CM | POA: Diagnosis not present

## 2023-05-14 DIAGNOSIS — M48061 Spinal stenosis, lumbar region without neurogenic claudication: Secondary | ICD-10-CM | POA: Diagnosis not present

## 2023-05-14 DIAGNOSIS — R200 Anesthesia of skin: Secondary | ICD-10-CM | POA: Diagnosis not present

## 2023-05-14 DIAGNOSIS — Z8249 Family history of ischemic heart disease and other diseases of the circulatory system: Secondary | ICD-10-CM | POA: Diagnosis not present

## 2023-05-14 DIAGNOSIS — M6281 Muscle weakness (generalized): Secondary | ICD-10-CM | POA: Diagnosis not present

## 2023-05-14 DIAGNOSIS — M4856XA Collapsed vertebra, not elsewhere classified, lumbar region, initial encounter for fracture: Secondary | ICD-10-CM | POA: Diagnosis not present

## 2023-05-14 DIAGNOSIS — R197 Diarrhea, unspecified: Secondary | ICD-10-CM | POA: Diagnosis not present

## 2023-05-14 DIAGNOSIS — Z87891 Personal history of nicotine dependence: Secondary | ICD-10-CM | POA: Diagnosis not present

## 2023-05-14 DIAGNOSIS — S199XXA Unspecified injury of neck, initial encounter: Secondary | ICD-10-CM | POA: Diagnosis not present

## 2023-05-14 DIAGNOSIS — I6782 Cerebral ischemia: Secondary | ICD-10-CM | POA: Diagnosis not present

## 2023-05-14 DIAGNOSIS — R000 Tachycardia, unspecified: Secondary | ICD-10-CM | POA: Diagnosis not present

## 2023-05-14 DIAGNOSIS — Z880 Allergy status to penicillin: Secondary | ICD-10-CM | POA: Diagnosis not present

## 2023-05-14 DIAGNOSIS — E785 Hyperlipidemia, unspecified: Secondary | ICD-10-CM | POA: Diagnosis not present

## 2023-05-14 DIAGNOSIS — I129 Hypertensive chronic kidney disease with stage 1 through stage 4 chronic kidney disease, or unspecified chronic kidney disease: Secondary | ICD-10-CM | POA: Diagnosis not present

## 2023-05-14 NOTE — ED Triage Notes (Signed)
Pt reports she slipped on a pieces of slick cardboard on the way down she hit her right shoulder, head, and right knee and leg about 1:30pm   Pt states Cannot raise right shoulder  Took oxycodone   Feels weak states she did not eat today. Pt has a slight gasp after her sentences.

## 2023-05-14 NOTE — ED Triage Notes (Signed)
Pt here POV after a fall, states she slipped and fell hitting her R shoulder, knee and leg, and R side of head. Bruising noted to R axillary. States she is unable to move her R arm. Denies blood thinners. Reports taking Oxycodone and Tylenol.

## 2023-05-14 NOTE — ED Provider Notes (Signed)
[facility]    CSN: [id number] Arrival date & time: 05/14/23  1757      History   Chief Complaint No chief complaint on file.   HPI Kimberly Beck is a 77 y.o. female.   Patient presents for further evaluation and management after a fall that occurred about 1 to 1:30 PM today.  Reports that she slipped on cardboard boxes falling forward into a wooden door.  She reports that she hit her head, then her shoulder, then her right knee.  She is not able to move her shoulder.  Denies loss of consciousness.  She does not take any blood thinning medications.  Denies headache, dizziness, blurred vision, nausea, vomiting.  She has a laceration to her right knee.  She states that her tetanus vaccination was within the past 5 years. Patient states that she hit the right side of her head.      Past Medical History:  Diagnosis Date   Allergy    Anemia    Cancer (HCC)    Complication of anesthesia    Depression    DVT, lower extremity, recurrent, right (HCC)    History of hiatal hernia    HTN (hypertension)    Lung cancer (HCC)    Right   Pneumonia    PONV (postoperative nausea and vomiting)    N/V only after receiving ether.   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   Lung nodule 04/23/2020    Past Surgical History:  Procedure Laterality Date   ESOPHAGOGASTRODUODENOSCOPY N/A 07/29/2020   Procedure: ESOPHAGOGASTRODUODENOSCOPY (EGD);  Surgeon: Corliss Skains, MD;  Location: [facility] OR;  Service: Thoracic;  Laterality: N/A;   EYE SURGERY     Bilateral cataract removal   INTERCOSTAL NERVE BLOCK Right 04/29/2020   Procedure:  INTERCOSTAL NERVE BLOCK;  Surgeon: Corliss Skains, MD;  Location: [facility] OR;  Service: Thoracic;  Laterality: Right;   LYMPH NODE DISSECTION Right 04/29/2020   Procedure: LYMPH NODE DISSECTION;  Surgeon: Corliss Skains, MD;  Location: [facility] OR;  Service: Thoracic;  Laterality: Right;   TONSILLECTOMY     TONSILLECTOMY AND ADENOIDECTOMY     TUBAL LIGATION     XI ROBOTIC ASSISTED PARAESOPHAGEAL HERNIA REPAIR N/A 07/29/2020   Procedure: XI ROBOTIC ASSISTED PARAESOPHAGEAL HERNIA REPAIR;  Surgeon: Corliss Skains, MD;  Location: [facility] OR;  Service: Thoracic;  Laterality: N/A;    OB History   No obstetric history on file.      Home Medications    Prior to Admission medications   Medication Sig Start Date End Date Taking? Authorizing Provider  albuterol (VENTOLIN HFA) 108 (90 Base) MCG/ACT inhaler Inhale 2 puffs into the lungs every 6 (six) hours as needed for wheezing or shortness of breath. 05/20/20   Josephine Igo, DO  aspirin EC 81 MG tablet Take 81 mg by mouth daily. Swallow whole.    [provider]  Azelastine HCl 0.15 % SOLN Place 1 spray into both nostrils in the morning and at bedtime. 12/25/19   [provider]  calcium citrate (CALCITRATE - DOSED IN MG ELEMENTAL CALCIUM) 950 (200 Ca) MG tablet Take 200 mg  of elemental calcium by mouth daily.    [provider]  Cholecalciferol (VITAMIN D) 50 MCG (2000 UT) CAPS Take 2,000 Units by mouth daily.    [provider]  clonazePAM (KLONOPIN) 1 MG tablet 1 tablet    [provider]  escitalopram (LEXAPRO) 10 MG tablet Take 10 mg by mouth daily.  04/02/19   [provider]  lisinopril-hydrochlorothiazide (ZESTORETIC) 20-25 MG tablet Take 1 tablet by mouth daily.     [provider]  metoprolol succinate (TOPROL-XL) 25 MG 24 hr tablet TAKE 1 TABLET BY MOUTH DAILY 11/28/22   Runell Gess, MD  montelukast (SINGULAIR) 10 MG tablet Take 10 mg by mouth daily. 01/07/20    [provider]  Multiple Vitamin (MULTIVITAMIN) tablet Take 1 tablet by mouth daily.    [provider]  pantoprazole (PROTONIX) 40 MG tablet Take 1 tablet (40 mg total) by mouth daily. 03/16/23   Lightfoot, Eliezer Lofts, MD  rosuvastatin (CRESTOR) 5 MG tablet Take 1 tablet by mouth daily.    [provider]    Family History Family History  Problem Relation Age of Onset   Heart attack Father    Stroke Maternal Grandfather     Social History Social History   Tobacco Use   Smoking status: Former    Current packs/day: 0.00    Average packs/day: 3.0 packs/day for 25.0 years (75.0 ttl pk-yrs)    Types: Cigarettes    Start date: 17    Quit date: 36    Years since quitting: 37.8   Smokeless tobacco: Never  Vaping Use   Vaping status: Never Used  Substance Use Topics   Alcohol use: Never   Drug use: Never     Allergies   Codeine, Nsaids, Other, and Penicillins   Review of Systems Review of Systems Per HPI  Physical Exam Triage Vital Signs ED Triage Vitals  Encounter Vitals Group     BP 05/14/23 1818 110/74     Systolic BP Percentile --      Diastolic BP Percentile --      Pulse Rate 05/14/23 1818 68     Resp 05/14/23 1818 18     Temp 05/14/23 1818 (!) 97.5 F (36.4 C)     Temp Source 05/14/23 1818 Oral     SpO2 05/14/23 1818 97 %     Weight --      Height --      Head Circumference --      Peak Flow --      Pain Score 05/14/23 1819 10     Pain Loc --      Pain Education --      Exclude from Growth Chart --    No data found.  Updated Vital Signs BP 110/74 (BP Location: Left Arm)   Pulse 68   Temp (!) 97.5 F (36.4 C) (Oral)   Resp 18   SpO2 97%   Visual Acuity Right Eye Distance:   Left Eye Distance:   Bilateral Distance:    Right Eye Near:   Left Eye Near:    Bilateral Near:     Physical Exam Constitutional:      General: She is not in acute distress.    Appearance: Normal appearance. She is not toxic-appearing  or diaphoretic.  HENT:     Head: Normocephalic and atraumatic.     Comments: No obvious swelling, lacerations, abrasions, contusion noted to side of head where the impact occurred. Eyes:  Extraocular Movements: Extraocular movements intact.     Conjunctiva/sclera: Conjunctivae normal.  Pulmonary:     Effort: Pulmonary effort is normal.  Musculoskeletal:     Comments: Patient has a large bruise present to inner upper right arm.  No abrasion or laceration noted.  Patient is not able to abduct or move upper extremity away from body.  Also has small bruising noted to forearm.  Tenderness to palpation surrounding this area.  Patient is also significantly tender to right shoulder and right humerus.  Grip strength is 5/5.  Capillary refill and pulses appear to be intact.  Mild tenderness to palpation surrounding laceration to the right anterior knee.  Patient has irregularly shaped skin tear present to anterior knee with bleeding controlled.  Full range of motion of knee present.  Capillary refill and pulses intact.  Neurological:     General: No focal deficit present.     Mental Status: She is alert and oriented to person, place, and time. Mental status is at baseline.     Cranial Nerves: Cranial nerves 2-12 are intact.     Sensory: Sensation is intact.     Motor: Motor function is intact.     Coordination: Coordination is intact.     Gait: Gait is intact.  Psychiatric:        Mood and Affect: Mood normal.        Behavior: Behavior normal.        Thought Content: Thought content normal.        Judgment: Judgment normal.      UC Treatments / Results  Labs (all labs ordered are listed, but only abnormal results are displayed) Labs Reviewed - No data to display  EKG   Radiology No results found.  Procedures Procedures (including critical care time)  Medications Ordered in UC Medications - No data to display  Initial Impression / Assessment and Plan / UC Course  I have  reviewed the triage vital signs and the nursing notes.  Pertinent labs & imaging results that were available during my care of the patient were reviewed by me and considered in my medical decision making (see chart for details).     Given head injury and concern for shoulder dislocation on physical exam, patient was advised to go to the emergency department for further evaluation and management.  She was agreeable with plan.  Vital signs and neuroexam stable at discharge.  Agree with patient's family member transporting her to the ER. Final Clinical Impressions(s) / UC Diagnoses   Final diagnoses:  Fall, initial encounter  Injury of head, initial encounter  Acute pain of right shoulder  Acute pain of right knee  Laceration of right knee, initial encounter   Discharge Instructions   None    ED Prescriptions   None    PDMP not reviewed this encounter.   Gustavus Bryant, [place] 05/14/23 [phone]

## 2023-05-15 DIAGNOSIS — S42211A Unspecified displaced fracture of surgical neck of right humerus, initial encounter for closed fracture: Secondary | ICD-10-CM | POA: Diagnosis not present

## 2023-05-15 MED ORDER — KETOROLAC TROMETHAMINE 60 MG/2ML IM SOLN
60.0000 mg | Freq: Once | INTRAMUSCULAR | Status: DC
  Filled 2023-05-15: qty 2

## 2023-05-15 MED ORDER — TRAMADOL HCL 50 MG PO TABS: 50.0000 mg | ORAL_TABLET | Freq: Four times a day (QID) | ORAL | 0 refills | Status: DC | PRN

## 2023-05-15 MED ORDER — KETOROLAC TROMETHAMINE 60 MG/2ML IM SOLN
60.0000 mg | Freq: Once | INTRAMUSCULAR | Status: AC
  Administered 2023-05-15: 60 mg via INTRAMUSCULAR

## 2023-05-15 MED ORDER — LIDOCAINE 5 % EX PTCH: 1.0000 | MEDICATED_PATCH | CUTANEOUS | 0 refills | Status: AC

## 2023-05-15 MED ORDER — ACETAMINOPHEN 500 MG PO TABS
1000.0000 mg | ORAL_TABLET | Freq: Once | ORAL | Status: AC
  Administered 2023-05-15: 1000 mg via ORAL
  Filled 2023-05-15: qty 2

## 2023-05-15 NOTE — Progress Notes (Signed)
Orthopedic Tech Progress Note Patient Details:  Kimberly Beck 08-23-1945 [id number]  Ortho Devices Type of Ortho Device: Sling immobilizer Ortho Device/Splint Location: rue Ortho Device/Splint Interventions: Ordered, Application, Adjustment   Post Interventions Patient Tolerated: Well Instructions Provided: Care of device  [name] Post 05/15/2023, 4:11 AM

## 2023-05-15 NOTE — ED Provider Notes (Signed)
[place] [facility] AT [facility] Provider Note   CSN: [id number] Arrival date & time: 05/14/23  1934     History  Chief Complaint  Patient presents with   Kimberly Beck YUKA SIRAVO is a 77 y.o. female.  The history is provided by the patient.  Fall This is a new problem. The current episode started 12 to 24 hours ago. The problem occurs constantly. The problem has not changed since onset.Pertinent negatives include no chest pain, no abdominal pain, no headaches and no shortness of breath. Nothing aggravates the symptoms. Nothing relieves the symptoms. Treatments tried: tylenol. The treatment provided no relief.  Patient fell over a piece of cardboard hit right shoulder.  No weakness no numbness no back or neck pain.      Home Medications Prior to Admission medications   Medication Sig Start Date End Date Taking? Authorizing Provider  lidocaine (LIDODERM) 5 % Place 1 patch onto the skin daily. Remove & Discard patch within 12 hours or as directed by MD 05/15/23  Yes Kimiya Brunelle, MD  traMADol (ULTRAM) 50 MG tablet Take 1 tablet (50 mg total) by mouth every 6 (six) hours as needed. 05/15/23  Yes Rembert Browe, MD  albuterol (VENTOLIN HFA) 108 (90 Base) MCG/ACT inhaler Inhale 2 puffs into the lungs every 6 (six) hours as needed for wheezing or shortness of breath. 05/20/20   Josephine Igo, DO  aspirin EC 81 MG tablet Take 81 mg by mouth daily. Swallow whole.    [provider]  Azelastine HCl 0.15 % SOLN Place 1 spray into both nostrils in the morning and at bedtime. 12/25/19   [provider]  calcium citrate (CALCITRATE - DOSED IN MG ELEMENTAL CALCIUM) 950 (200 Ca) MG tablet Take 200 mg of elemental calcium by mouth daily.    [provider]  Cholecalciferol (VITAMIN D) 50 MCG (2000 UT) CAPS Take 2,000 Units by mouth daily.    [provider]  clonazePAM (KLONOPIN) 1 MG tablet 1 tablet    [provider]   escitalopram (LEXAPRO) 10 MG tablet Take 10 mg by mouth daily.  04/02/19   [provider]  lisinopril-hydrochlorothiazide (ZESTORETIC) 20-25 MG tablet Take 1 tablet by mouth daily.     [provider]  metoprolol succinate (TOPROL-XL) 25 MG 24 hr tablet TAKE 1 TABLET BY MOUTH DAILY 11/28/22   Runell Gess, MD  montelukast (SINGULAIR) 10 MG tablet Take 10 mg by mouth daily. 01/07/20   [provider]  Multiple Vitamin (MULTIVITAMIN) tablet Take 1 tablet by mouth daily.    [provider]  pantoprazole (PROTONIX) 40 MG tablet Take 1 tablet (40 mg total) by mouth daily. 03/16/23   Lightfoot, Eliezer Lofts, MD  rosuvastatin (CRESTOR) 5 MG tablet Take 1 tablet by mouth daily.    [provider]      Allergies    Codeine, Nsaids, Other, and Penicillins    Review of Systems   Review of Systems  Constitutional:  Negative for fever.  Respiratory:  Negative for shortness of breath.   Cardiovascular:  Negative for chest pain.  Gastrointestinal:  Negative for abdominal pain.  Musculoskeletal:  Positive for arthralgias.  Neurological:  Negative for headaches.  All other systems reviewed and are negative.   Physical Exam Updated Vital Signs BP (!) 130/99   Pulse 64   Temp 97.8 F (36.6 C) (Oral)   Resp 18   Ht 5\' 6"  (1.676 m)  Wt 69.9 kg   SpO2 100%   BMI 24.86 kg/m  Physical Exam Vitals and nursing note reviewed.  Constitutional:      General: She is not in acute distress.    Appearance: Normal appearance. She is well-developed.  HENT:     Head: Normocephalic and atraumatic.     Nose: Nose normal.  Eyes:     Pupils: Pupils are equal, round, and reactive to light.  Cardiovascular:     Rate and Rhythm: Normal rate and regular rhythm.     Pulses: Normal pulses.     Heart sounds: Normal heart sounds.  Pulmonary:     Effort: Pulmonary effort is normal. No respiratory distress.     Breath sounds: Normal breath sounds.  Abdominal:      General: Bowel sounds are normal. There is no distension.     Palpations: Abdomen is soft.     Tenderness: There is no abdominal tenderness. There is no guarding or rebound.  Musculoskeletal:     Right shoulder: Tenderness and bony tenderness present. Decreased range of motion.     Cervical back: Normal range of motion and neck supple.  Skin:    General: Skin is dry.     Capillary Refill: Capillary refill takes less than 2 seconds.     Findings: No erythema or rash.  Neurological:     General: No focal deficit present.     Mental Status: She is alert.     Deep Tendon Reflexes: Reflexes normal.  Psychiatric:        Mood and Affect: Mood normal.     ED Results / Procedures / Treatments   Labs (all labs ordered are listed, but only abnormal results are displayed) Labs Reviewed - No data to display  EKG None  Radiology DG Knee Complete 4 Views Right  Result Date: 05/14/2023 CLINICAL DATA:  Status post fall. EXAM: RIGHT KNEE - COMPLETE 4+ VIEW COMPARISON:  None Available. FINDINGS: No evidence of fracture, dislocation, or joint effusion. No evidence of arthropathy or other focal bone abnormality. An ill-defined linear soft tissue deformity is seen along the anteromedial aspect of the right knee. IMPRESSION: No acute osseous abnormality. Electronically Signed   By: Aram Candela M.D.   On: 05/14/2023 23:40   DG Humerus Right  Result Date: 05/14/2023 CLINICAL DATA:  Status post fall. EXAM: RIGHT HUMERUS - 2+ VIEW COMPARISON:  None Available. FINDINGS: An acute fracture deformity is seen involving the head and neck of the proximal right humerus. There is no evidence of dislocation. Soft tissues are unremarkable. IMPRESSION: Acute fracture of the proximal right humerus. Electronically Signed   By: Aram Candela M.D.   On: 05/14/2023 23:37   DG Shoulder Right  Result Date: 05/14/2023 CLINICAL DATA:  Status post fall. EXAM: RIGHT SHOULDER - 2+ VIEW COMPARISON:  None Available.  FINDINGS: An acute fracture deformity is seen extending through the head and neck of the proximal right humerus. There is no evidence of dislocation. There is no evidence of arthropathy or other focal bone abnormality. Soft tissues are unremarkable. IMPRESSION: Acute fracture of the proximal right humerus. Electronically Signed   By: Aram Candela M.D.   On: 05/14/2023 23:36   CT Cervical Spine Wo Contrast  Result Date: 05/14/2023 CLINICAL DATA:  Status post trauma. EXAM: CT CERVICAL SPINE WITHOUT CONTRAST TECHNIQUE: Multidetector CT imaging of the cervical spine was performed without intravenous contrast. Multiplanar CT image reconstructions were also generated. RADIATION DOSE REDUCTION: This exam was performed according  to the departmental dose-optimization program which includes automated exposure control, adjustment of the mA and/or kV according to patient size and/or use of iterative reconstruction technique. COMPARISON:  None Available. FINDINGS: Alignment: Normal. Skull base and vertebrae: No acute fracture. No primary bone lesion or focal pathologic process. Chronic and degenerative changes are seen involving the body and tip of the dens, as well as the adjacent portion of the anterior arch of C1. Soft tissues and spinal canal: No prevertebral fluid or swelling. No visible canal hematoma. Disc levels: Mild anterior osteophyte formation is seen at the levels of C3-C4, C4-C5, C5-C6 and C6-C7. Mild to moderate severity posterior bony spurring is seen at the level of C5-C6. Marked severity posterior intervertebral disc space narrowing is seen at the levels of C5-C6 and C6-C7. Bilateral moderate to marked severity multilevel facet joint hypertrophy is noted. Upper chest: Mild to moderate severity biapical scarring and/or atelectasis is seen. Other: Ill-defined thyroid nodules are seen within the right and left lobes of the thyroid gland. IMPRESSION: 1. No acute cervical spine fracture or subluxation. 2.  Marked severity degenerative changes at the levels of C5-C6 and C6-C7. 3. Ill-defined thyroid nodules. Correlation with nonemergent thyroid gland ultrasound is recommended. Electronically Signed   By: Aram Candela M.D.   On: 05/14/2023 23:36   CT Head Wo Contrast  Result Date: 05/14/2023 CLINICAL DATA:  Status post trauma. EXAM: CT HEAD WITHOUT CONTRAST TECHNIQUE: Contiguous axial images were obtained from the base of the skull through the vertex without intravenous contrast. RADIATION DOSE REDUCTION: This exam was performed according to the departmental dose-optimization program which includes automated exposure control, adjustment of the mA and/or kV according to patient size and/or use of iterative reconstruction technique. COMPARISON:  April 12, 2004 FINDINGS: Brain: There is mild cerebral atrophy with widening of the extra-axial spaces and ventricular dilatation. There are areas of decreased attenuation within the white matter tracts of the supratentorial brain, consistent with microvascular disease changes. 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 Vascular: Marked severity bilateral cavernous carotid artery calcification is seen. Skull: Normal. Negative for fracture or focal lesion. Sinuses/Orbits: No acute finding. Other: None. IMPRESSION: 1. Generalized cerebral atrophy with chronic white matter small vessel ischemic changes. 2. 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 3. No acute intracranial abnormality. Electronically Signed   By: Aram Candela M.D.   On: 05/14/2023 23:32    Procedures Procedures    Medications Ordered in ED Medications  ketorolac (TORADOL) injection 60 mg (has no administration in time range)  acetaminophen (TYLENOL) tablet 1,000 mg (has no administration in time range)    ED Course/ Medical Decision Making/ A&P                                 Medical Decision Making Fell onto a piece of cardboard   Amount and/or  Complexity of Data Reviewed External Data Reviewed: notes.    Details: Previous notes reviewed  Radiology: ordered and independent interpretation performed.    Details: Surgical neck of the humerus fracture negative CT head   Risk OTC drugs. Prescription drug management. Risk Details: Well appearing.  Placed in a shoulder immobilizer, patient will be given pain medication and instructed to follow up with orthopedics.  Stable for discharge.      Final Clinical Impression(s) / ED Diagnoses Final diagnoses:  Closed fracture of neck of right humerus, initial encounter    Rx /  DC OrdersReturn for intractable cough, coughing up blood, fevers > 100.4 unrelieved by medication, shortness of breath, intractable vomiting, chest pain, shortness of breath, weakness, numbness, changes in speech, facial asymmetry, abdominal pain, passing out, Inability to tolerate liquids or food, cough, altered mental status or any concerns. No signs of systemic illness or infection. The patient is nontoxic-appearing on exam and vital signs are within normal limits.  I have reviewed the triage vital signs and the nursing notes. Pertinent labs & imaging results that were available during my care of the patient were reviewed by me and considered in my medical decision making (see chart for details). After history, exam, and medical workup I feel the patient has been appropriately medically screened and is safe for discharge home. Pertinent diagnoses were discussed with the patient. Patient was given return precautions.  ED Discharge Orders          Ordered    traMADol (ULTRAM) 50 MG tablet  Every 6 hours PRN        05/15/23 0429    lidocaine (LIDODERM) 5 %  Every 24 hours        05/15/23 0429              Bhavik Cabiness, MD 05/15/23 0440

## 2023-05-15 NOTE — ED Notes (Signed)
Ortho tech notified about sling and swathe

## 2023-05-17 ENCOUNTER — Inpatient Hospital Stay (HOSPITAL_COMMUNITY)
Admission: EM | Admit: 2023-05-17 | Discharge: 2023-05-22 | DRG: 372 | Disposition: A | Discharge status: Home Health | Payer: Medicare Other | Attending: Internal Medicine | Admitting: Internal Medicine

## 2023-05-17 ENCOUNTER — Encounter (HOSPITAL_COMMUNITY): Payer: Self-pay | Admitting: Emergency Medicine

## 2023-05-17 ENCOUNTER — Other Ambulatory Visit: Payer: Self-pay

## 2023-05-17 ENCOUNTER — Emergency Department (HOSPITAL_COMMUNITY): Payer: Medicare Other

## 2023-05-17 DIAGNOSIS — Z880 Allergy status to penicillin: Secondary | ICD-10-CM

## 2023-05-17 DIAGNOSIS — E860 Dehydration: Secondary | ICD-10-CM | POA: Diagnosis present

## 2023-05-17 DIAGNOSIS — Z7982 Long term (current) use of aspirin: Secondary | ICD-10-CM | POA: Diagnosis not present

## 2023-05-17 DIAGNOSIS — W010XXD Fall on same level from slipping, tripping and stumbling without subsequent striking against object, subsequent encounter: Secondary | ICD-10-CM | POA: Diagnosis present

## 2023-05-17 DIAGNOSIS — S0990XA Unspecified injury of head, initial encounter: Secondary | ICD-10-CM | POA: Diagnosis not present

## 2023-05-17 DIAGNOSIS — I129 Hypertensive chronic kidney disease with stage 1 through stage 4 chronic kidney disease, or unspecified chronic kidney disease: Secondary | ICD-10-CM | POA: Diagnosis present

## 2023-05-17 DIAGNOSIS — N189 Chronic kidney disease, unspecified: Secondary | ICD-10-CM

## 2023-05-17 DIAGNOSIS — G8929 Other chronic pain: Secondary | ICD-10-CM | POA: Diagnosis present

## 2023-05-17 DIAGNOSIS — R7303 Prediabetes: Secondary | ICD-10-CM | POA: Diagnosis present

## 2023-05-17 DIAGNOSIS — R519 Headache, unspecified: Secondary | ICD-10-CM | POA: Diagnosis not present

## 2023-05-17 DIAGNOSIS — Z85118 Personal history of other malignant neoplasm of bronchus and lung: Secondary | ICD-10-CM | POA: Diagnosis not present

## 2023-05-17 DIAGNOSIS — F32A Depression, unspecified: Secondary | ICD-10-CM | POA: Diagnosis present

## 2023-05-17 DIAGNOSIS — I10 Essential (primary) hypertension: Secondary | ICD-10-CM | POA: Diagnosis not present

## 2023-05-17 DIAGNOSIS — M48061 Spinal stenosis, lumbar region without neurogenic claudication: Secondary | ICD-10-CM | POA: Diagnosis not present

## 2023-05-17 DIAGNOSIS — S42301D Unspecified fracture of shaft of humerus, right arm, subsequent encounter for fracture with routine healing: Secondary | ICD-10-CM

## 2023-05-17 DIAGNOSIS — Z823 Family history of stroke: Secondary | ICD-10-CM

## 2023-05-17 DIAGNOSIS — N182 Chronic kidney disease, stage 2 (mild): Secondary | ICD-10-CM | POA: Diagnosis present

## 2023-05-17 DIAGNOSIS — R109 Unspecified abdominal pain: Secondary | ICD-10-CM | POA: Diagnosis not present

## 2023-05-17 DIAGNOSIS — R000 Tachycardia, unspecified: Secondary | ICD-10-CM | POA: Diagnosis present

## 2023-05-17 DIAGNOSIS — M545 Low back pain, unspecified: Secondary | ICD-10-CM | POA: Diagnosis present

## 2023-05-17 DIAGNOSIS — E785 Hyperlipidemia, unspecified: Secondary | ICD-10-CM | POA: Diagnosis present

## 2023-05-17 DIAGNOSIS — Z87891 Personal history of nicotine dependence: Secondary | ICD-10-CM

## 2023-05-17 DIAGNOSIS — E876 Hypokalemia: Secondary | ICD-10-CM | POA: Diagnosis not present

## 2023-05-17 DIAGNOSIS — Z886 Allergy status to analgesic agent status: Secondary | ICD-10-CM

## 2023-05-17 DIAGNOSIS — Z885 Allergy status to narcotic agent status: Secondary | ICD-10-CM

## 2023-05-17 DIAGNOSIS — M4856XA Collapsed vertebra, not elsewhere classified, lumbar region, initial encounter for fracture: Secondary | ICD-10-CM | POA: Diagnosis not present

## 2023-05-17 DIAGNOSIS — M549 Dorsalgia, unspecified: Secondary | ICD-10-CM | POA: Diagnosis not present

## 2023-05-17 DIAGNOSIS — R197 Diarrhea, unspecified: Secondary | ICD-10-CM | POA: Diagnosis not present

## 2023-05-17 DIAGNOSIS — Z8249 Family history of ischemic heart disease and other diseases of the circulatory system: Secondary | ICD-10-CM

## 2023-05-17 DIAGNOSIS — K5730 Diverticulosis of large intestine without perforation or abscess without bleeding: Secondary | ICD-10-CM | POA: Diagnosis not present

## 2023-05-17 DIAGNOSIS — I499 Cardiac arrhythmia, unspecified: Secondary | ICD-10-CM | POA: Diagnosis not present

## 2023-05-17 DIAGNOSIS — A0472 Enterocolitis due to Clostridium difficile, not specified as recurrent: Principal | ICD-10-CM | POA: Diagnosis present

## 2023-05-17 DIAGNOSIS — Z79899 Other long term (current) drug therapy: Secondary | ICD-10-CM

## 2023-05-17 DIAGNOSIS — R531 Weakness: Secondary | ICD-10-CM | POA: Diagnosis not present

## 2023-05-17 DIAGNOSIS — Z86718 Personal history of other venous thrombosis and embolism: Secondary | ICD-10-CM

## 2023-05-17 DIAGNOSIS — R112 Nausea with vomiting, unspecified: Secondary | ICD-10-CM | POA: Diagnosis not present

## 2023-05-17 DIAGNOSIS — R6889 Other general symptoms and signs: Secondary | ICD-10-CM | POA: Diagnosis not present

## 2023-05-17 DIAGNOSIS — Z888 Allergy status to other drugs, medicaments and biological substances status: Secondary | ICD-10-CM | POA: Diagnosis not present

## 2023-05-17 DIAGNOSIS — N179 Acute kidney failure, unspecified: Secondary | ICD-10-CM

## 2023-05-17 DIAGNOSIS — D649 Anemia, unspecified: Secondary | ICD-10-CM | POA: Diagnosis present

## 2023-05-17 DIAGNOSIS — Z743 Need for continuous supervision: Secondary | ICD-10-CM | POA: Diagnosis not present

## 2023-05-17 DIAGNOSIS — R200 Anesthesia of skin: Secondary | ICD-10-CM | POA: Diagnosis not present

## 2023-05-17 DIAGNOSIS — M79606 Pain in leg, unspecified: Secondary | ICD-10-CM | POA: Diagnosis not present

## 2023-05-17 LAB — HEMOGLOBIN AND HEMATOCRIT, BLOOD
HCT: 39.5 % (ref 36.0–46.0)
Hemoglobin: 12.6 g/dL (ref 12.0–15.0)

## 2023-05-17 LAB — C DIFFICILE QUICK SCREEN W PCR REFLEX
C Diff antigen: POSITIVE — AB
C Diff toxin: NEGATIVE

## 2023-05-17 LAB — COMPREHENSIVE METABOLIC PANEL
ALT: 22 U/L (ref 0–44)
ALT: 22 U/L (ref 0–44)
AST: 42 U/L — ABNORMAL HIGH (ref 15–41)
AST: 40 U/L (ref 15–41)
Albumin: 2.7 g/dL — ABNORMAL LOW (ref 3.5–5.0)
Albumin: 2.8 g/dL — ABNORMAL LOW (ref 3.5–5.0)
Alkaline Phosphatase: 48 U/L (ref 38–126)
Alkaline Phosphatase: 46 U/L (ref 38–126)
Anion gap: 18 — ABNORMAL HIGH (ref 5–15)
Anion gap: 10 (ref 5–15)
BUN: 39 mg/dL — ABNORMAL HIGH (ref 8–23)
BUN: 42 mg/dL — ABNORMAL HIGH (ref 8–23)
CO2: 17 mmol/L — ABNORMAL LOW (ref 22–32)
CO2: 24 mmol/L (ref 22–32)
Calcium: 8.6 mg/dL — ABNORMAL LOW (ref 8.9–10.3)
Calcium: 8.5 mg/dL — ABNORMAL LOW (ref 8.9–10.3)
Chloride: 100 mmol/L (ref 98–111)
Chloride: 107 mmol/L (ref 98–111)
Creatinine, Ser: 1.51 mg/dL — ABNORMAL HIGH (ref 0.44–1.00)
Creatinine, Ser: 1.44 mg/dL — ABNORMAL HIGH (ref 0.44–1.00)
GFR, Estimated: 36 mL/min — ABNORMAL LOW (ref >60)
GFR, Estimated: 38 mL/min — ABNORMAL LOW (ref >60)
Glucose, Bld: 153 mg/dL — ABNORMAL HIGH (ref 70–99)
Glucose, Bld: 156 mg/dL — ABNORMAL HIGH (ref 70–99)
Potassium: 2.9 mmol/L — ABNORMAL LOW (ref 3.5–5.1)
Potassium: 3.9 mmol/L (ref 3.5–5.1)
Sodium: 135 mmol/L (ref 135–145)
Sodium: 141 mmol/L (ref 135–145)
Total Bilirubin: 1.5 mg/dL — ABNORMAL HIGH (ref <1.2)
Total Bilirubin: 1.1 mg/dL (ref <1.2)
Total Protein: 4.8 g/dL — ABNORMAL LOW (ref 6.5–8.1)
Total Protein: 5.6 g/dL — ABNORMAL LOW (ref 6.5–8.1)

## 2023-05-17 LAB — CBC WITH DIFFERENTIAL/PLATELET
Abs Immature Granulocytes: 0.04 10*3/uL (ref 0.00–0.07)
Basophils Absolute: 0 10*3/uL (ref 0.0–0.1)
Basophils Relative: 0 %
Eosinophils Absolute: 0 10*3/uL (ref 0.0–0.5)
Eosinophils Relative: 0 %
HCT: 40.6 % (ref 36.0–46.0)
Hemoglobin: 12.8 g/dL (ref 12.0–15.0)
Immature Granulocytes: 0 %
Lymphocytes Relative: 4 %
Lymphs Abs: 0.4 10*3/uL — ABNORMAL LOW (ref 0.7–4.0)
MCH: 29 pg (ref 26.0–34.0)
MCHC: 31.5 g/dL (ref 30.0–36.0)
MCV: 92.1 fL (ref 80.0–100.0)
Monocytes Absolute: 0.5 10*3/uL (ref 0.1–1.0)
Monocytes Relative: 4 %
Neutro Abs: 11 10*3/uL — ABNORMAL HIGH (ref 1.7–7.7)
Neutrophils Relative %: 92 %
Platelets: 178 10*3/uL (ref 150–400)
RBC: 4.41 MIL/uL (ref 3.87–5.11)
RDW: 14.6 % (ref 11.5–15.5)
WBC: 12.1 10*3/uL — ABNORMAL HIGH (ref 4.0–10.5)
nRBC: 0 % (ref 0.0–0.2)

## 2023-05-17 LAB — TYPE AND SCREEN
ABO/RH(D): O POS
Antibody Screen: NEGATIVE

## 2023-05-17 LAB — PROTIME-INR
INR: 1.1 (ref 0.8–1.2)
Prothrombin Time: 14.6 s (ref 11.4–15.2)

## 2023-05-17 LAB — MAGNESIUM: Magnesium: 1.6 mg/dL — ABNORMAL LOW (ref 1.7–2.4)

## 2023-05-17 LAB — CBG MONITORING, ED: Glucose-Capillary: 160 mg/dL — ABNORMAL HIGH (ref 70–99)

## 2023-05-17 LAB — POC OCCULT BLOOD, ED: Fecal Occult Bld: NEGATIVE

## 2023-05-17 LAB — CK: Total CK: 264 U/L — ABNORMAL HIGH (ref 38–234)

## 2023-05-17 LAB — CLOSTRIDIUM DIFFICILE BY PCR, REFLEXED: Toxigenic C. Difficile by PCR: POSITIVE — AB

## 2023-05-17 MED ORDER — FAMOTIDINE IN NACL 20-0.9 MG/50ML-% IV SOLN
20.0000 mg | Freq: Two times a day (BID) | INTRAVENOUS | Status: DC
  Administered 2023-05-17 – 2023-05-18 (×3): 20 mg via INTRAVENOUS
  Filled 2023-05-17 (×5): qty 50

## 2023-05-17 MED ORDER — HEPARIN SODIUM (PORCINE) 5000 UNIT/ML IJ SOLN
5000.0000 [IU] | Freq: Three times a day (TID) | INTRAMUSCULAR | Status: DC
  Administered 2023-05-17 – 2023-05-22 (×16): 5000 [IU] via SUBCUTANEOUS
  Filled 2023-05-17 (×16): qty 1

## 2023-05-17 MED ORDER — POTASSIUM CHLORIDE 10 MEQ/100ML IV SOLN
10.0000 meq | INTRAVENOUS | Status: DC
  Administered 2023-05-17: 10 meq via INTRAVENOUS
  Filled 2023-05-17: qty 100

## 2023-05-17 MED ORDER — HYDROMORPHONE HCL 1 MG/ML IJ SOLN
0.5000 mg | INTRAMUSCULAR | Status: DC | PRN
  Administered 2023-05-17 – 2023-05-21 (×4): 0.5 mg via INTRAVENOUS
  Filled 2023-05-17 (×4): qty 0.5

## 2023-05-17 MED ORDER — SODIUM CHLORIDE 0.9 % IV SOLN
12.5000 mg | Freq: Four times a day (QID) | INTRAVENOUS | Status: DC | PRN
  Administered 2023-05-17 – 2023-05-19 (×2): 12.5 mg via INTRAVENOUS
  Filled 2023-05-17 (×3): qty 0.5

## 2023-05-17 MED ORDER — LOPERAMIDE HCL 2 MG PO CAPS: 2.0000 mg | ORAL_CAPSULE | ORAL | Status: DC | PRN

## 2023-05-17 MED ORDER — ACETAMINOPHEN 650 MG RE SUPP: 650.0000 mg | Freq: Four times a day (QID) | RECTAL | Status: DC | PRN

## 2023-05-17 MED ORDER — VANCOMYCIN HCL 125 MG PO CAPS
125.0000 mg | ORAL_CAPSULE | Freq: Four times a day (QID) | ORAL | Status: DC
  Administered 2023-05-17 – 2023-05-22 (×24): 125 mg via ORAL
  Filled 2023-05-17 (×26): qty 1

## 2023-05-17 MED ORDER — ONDANSETRON HCL 4 MG/2ML IJ SOLN
4.0000 mg | Freq: Once | INTRAMUSCULAR | Status: AC
Start: 2023-05-17 — End: 2023-05-17
  Administered 2023-05-17: 4 mg via INTRAVENOUS
  Filled 2023-05-17: qty 2

## 2023-05-17 MED ORDER — LACTATED RINGERS IV BOLUS
1000.0000 mL | Freq: Once | INTRAVENOUS | Status: AC
  Administered 2023-05-17: 1000 mL via INTRAVENOUS

## 2023-05-17 MED ORDER — ACETAMINOPHEN 325 MG PO TABS: 650.0000 mg | ORAL_TABLET | Freq: Four times a day (QID) | ORAL | Status: DC | PRN

## 2023-05-17 MED ORDER — MAGNESIUM SULFATE 2 GM/50ML IV SOLN
2.0000 g | Freq: Once | INTRAVENOUS | Status: DC
  Filled 2023-05-17: qty 50

## 2023-05-17 MED ORDER — VANCOMYCIN HCL 125 MG PO CAPS: 125.0000 mg | ORAL_CAPSULE | Freq: Four times a day (QID) | ORAL | Status: DC

## 2023-05-17 MED ORDER — FENTANYL CITRATE PF 50 MCG/ML IJ SOSY
50.0000 ug | PREFILLED_SYRINGE | Freq: Once | INTRAMUSCULAR | Status: AC
  Administered 2023-05-17: 50 ug via INTRAVENOUS
  Filled 2023-05-17: qty 1

## 2023-05-17 MED ORDER — OXYCODONE HCL 5 MG PO TABS
5.0000 mg | ORAL_TABLET | ORAL | Status: DC | PRN
  Administered 2023-05-18 – 2023-05-22 (×6): 5 mg via ORAL
  Filled 2023-05-17 (×7): qty 1

## 2023-05-17 MED ORDER — POTASSIUM CHLORIDE CRYS ER 20 MEQ PO TBCR
40.0000 meq | EXTENDED_RELEASE_TABLET | Freq: Two times a day (BID) | ORAL | Status: AC
  Administered 2023-05-17 (×2): 40 meq via ORAL
  Filled 2023-05-17 (×2): qty 2

## 2023-05-17 MED ORDER — SODIUM CHLORIDE 0.9 % IV SOLN
INTRAVENOUS | Status: AC
Start: 2023-05-17 — End: 2023-05-18

## 2023-05-17 MED ORDER — POTASSIUM CHLORIDE 10 MEQ/100ML IV SOLN
10.0000 meq | INTRAVENOUS | Status: AC
  Administered 2023-05-17 (×2): 10 meq via INTRAVENOUS
  Filled 2023-05-17 (×2): qty 100

## 2023-05-17 NOTE — ED Notes (Signed)
ED TO INPATIENT HANDOFF REPORT  ED Nurse Name and Phone #: [phone]  S Name/Age/Gender Kimberly Beck 77 y.o. female Room/Bed: 022C/022C  Code Status   Code Status: Prior  Home/SNF/Other Home Patient oriented to: self, place, time, and situation Is this baseline? Yes   Triage Complete: Triage complete  Chief Complaint Clostridioides difficile diarrhea [A04.72]  Triage Note No notes on file   Allergies Allergies  Allergen Reactions   Codeine Itching    Drunk feeling    Nsaids Other (See Comments)    Liver    Other     Other reaction(s): diarrhea and headache Other reaction(s): Global anesthesia Other reaction(s): edema   Penicillins Rash    Dry rash    Level of Care/Admitting Diagnosis ED Disposition     ED Disposition  Admit   Condition  --   Comment  Hospital Area: [facility] [[id number]]  Level of Care: Med-Surg [16]  May place patient in observation at [facility] or [facility] if equivalent level of care is available:: Yes  Covid Evaluation: Asymptomatic - no recent exposure (last 10 days) testing not required  Diagnosis: Clostridioides difficile diarrhea [[id number]]  Admitting Physician: Gery Pray [[id number]]  Attending Physician: Gery Pray [[id number]]          B Medical/Surgery History Past Medical History:  Diagnosis Date   Allergy    Anemia    Cancer (HCC)    Complication of anesthesia    Depression    DVT, lower extremity, recurrent, right (HCC)    History of hiatal hernia    HTN (hypertension)    Lung cancer (HCC)    Right   Pneumonia    PONV (postoperative nausea and vomiting)    N/V only after receiving ether.   Pre-diabetes    BS checks daily, takes no medicines   Past Surgical History:  Procedure Laterality Date   ESOPHAGOGASTRODUODENOSCOPY N/A 07/29/2020   Procedure: ESOPHAGOGASTRODUODENOSCOPY (EGD);  Surgeon: Corliss Skains, MD;  Location: [facility] OR;  Service: Thoracic;  Laterality: N/A;   EYE  SURGERY     Bilateral cataract removal   INTERCOSTAL NERVE BLOCK Right 04/29/2020   Procedure: INTERCOSTAL NERVE BLOCK;  Surgeon: Corliss Skains, MD;  Location: [facility] OR;  Service: Thoracic;  Laterality: Right;   LYMPH NODE DISSECTION Right 04/29/2020   Procedure: LYMPH NODE DISSECTION;  Surgeon: Corliss Skains, MD;  Location: [facility] OR;  Service: Thoracic;  Laterality: Right;   TONSILLECTOMY     TONSILLECTOMY AND ADENOIDECTOMY     TUBAL LIGATION     XI ROBOTIC ASSISTED PARAESOPHAGEAL HERNIA REPAIR N/A 07/29/2020   Procedure: XI ROBOTIC ASSISTED PARAESOPHAGEAL HERNIA REPAIR;  Surgeon: Corliss Skains, MD;  Location: [facility] OR;  Service: Thoracic;  Laterality: N/A;     A IV Location/Drains/Wounds Patient Lines/Drains/Airways Status     Active Line/Drains/Airways     Name Placement date Placement time Site Days   Peripheral IV 05/17/23 22 G Left Hand 05/17/23  0136  Hand  less than 1            Intake/Output Last 24 hours  Intake/Output Summary (Last 24 hours) at 05/17/2023 0423 Last data filed at 05/17/2023 0356 Gross per 24 hour  Intake 1000 ml  Output --  Net 1000 ml    Labs/Imaging Results for orders placed or performed during the hospital encounter of 05/17/23 (from the past 48 hour(s))  CBG monitoring, ED     Status: Abnormal   Collection Time: 05/17/23  1:16 AM  Result Value Ref Range   Glucose-Capillary 160 (H) 70 - 99 mg/dL    Comment: Glucose reference range applies only to samples taken after fasting for at least 8 hours.  CBC with Differential     Status: Abnormal   Collection Time: 05/17/23  1:29 AM  Result Value Ref Range   WBC 12.1 (H) 4.0 - 10.5 K/uL   RBC 4.41 3.87 - 5.11 MIL/uL   Hemoglobin 12.8 12.0 - 15.0 g/dL   HCT 44.0 10.2 - 72.5 %   MCV 92.1 80.0 - 100.0 fL   MCH 29.0 26.0 - 34.0 pg   MCHC 31.5 30.0 - 36.0 g/dL   RDW 36.6 44.0 - 34.7 %   Platelets 178 150 - 400 K/uL   nRBC 0.0 0.0 - 0.2 %   Neutrophils Relative % 92 %   Neutro Abs  11.0 (H) 1.7 - 7.7 K/uL   Lymphocytes Relative 4 %   Lymphs Abs 0.4 (L) 0.7 - 4.0 K/uL   Monocytes Relative 4 %   Monocytes Absolute 0.5 0.1 - 1.0 K/uL   Eosinophils Relative 0 %   Eosinophils Absolute 0.0 0.0 - 0.5 K/uL   Basophils Relative 0 %   Basophils Absolute 0.0 0.0 - 0.1 K/uL   Immature Granulocytes 0 %   Abs Immature Granulocytes 0.04 0.00 - 0.07 K/uL    Comment: Performed at [facility], [address], [place], [place] [address]  Comprehensive metabolic panel     Status: Abnormal   Collection Time: 05/17/23  1:29 AM  Result Value Ref Range   Sodium 135 135 - 145 mmol/L   Potassium 2.9 (L) 3.5 - 5.1 mmol/L   Chloride 100 98 - 111 mmol/L   CO2 17 (L) 22 - 32 mmol/L   Glucose, Bld 153 (H) 70 - 99 mg/dL    Comment: Glucose reference range applies only to samples taken after fasting for at least 8 hours.   BUN 39 (H) 8 - 23 mg/dL   Creatinine, Ser 6.38 (H) 0.44 - 1.00 mg/dL   Calcium 8.6 (L) 8.9 - 10.3 mg/dL   Total Protein 4.8 (L) 6.5 - 8.1 g/dL   Albumin 2.7 (L) 3.5 - 5.0 g/dL   AST 42 (H) 15 - 41 U/L   ALT 22 0 - 44 U/L   Alkaline Phosphatase 48 38 - 126 U/L   Total Bilirubin 1.5 (H) <1.2 mg/dL   GFR, Estimated 36 (L) >60 mL/min    Comment: (NOTE) Calculated using the CKD-EPI Creatinine Equation (2021)    Anion gap 18 (H) 5 - 15    Comment: Performed at [facility], [address], [place], [place] [address]  Protime-INR     Status: None   Collection Time: 05/17/23  1:29 AM  Result Value Ref Range   Prothrombin Time 14.6 11.4 - 15.2 seconds   INR 1.1 0.8 - 1.2    Comment: (NOTE) INR goal varies based on device and disease states. Performed at [facility], [address] [place], [place] [address]   CK     Status: Abnormal   Collection Time: 05/17/23  1:29 AM  Result Value Ref Range   Total CK 264 (H) 38 - 234 U/L    Comment: Performed at [facility], [address], [place], [place] [address]  C Difficile Quick Screen w PCR  reflex     Status: Abnormal   Collection Time: 05/17/23  1:48  AM   Specimen: STOOL  Result Value Ref Range   C Diff antigen POSITIVE (A) NEGATIVE   C Diff toxin NEGATIVE NEGATIVE   C Diff interpretation Results are indeterminate. See PCR results.     Comment: Performed at [facility], [address], [place], [place] [address]  C. Diff by PCR, Reflexed     Status: Abnormal   Collection Time: 05/17/23  1:48 AM  Result Value Ref Range   Toxigenic C. Difficile by PCR POSITIVE (A) NEGATIVE    Comment: Positive for toxigenic C. difficile with little to no toxin production. Only treat if clinical presentation suggests symptomatic illness. Performed at [facility], [address], [place], [place] [address]   Type and screen     Status: None   Collection Time: 05/17/23  1:51 AM  Result Value Ref Range   ABO/RH(D) O POS    Antibody Screen NEG    Sample Expiration      05/20/2023,2359 Performed at [facility], [address] [place], [place] [address]   POC occult blood, ED     Status: None   Collection Time: 05/17/23  2:02 AM  Result Value Ref Range   Fecal Occult Bld NEGATIVE NEGATIVE   CT PELVIS WO CONTRAST  Result Date: 05/17/2023 CLINICAL DATA:  Fall 3 days ago with worsening pain. EXAM: CT PELVIS WITHOUT CONTRAST TECHNIQUE: Multidetector CT imaging of the pelvis was performed following the standard protocol without intravenous contrast. RADIATION DOSE REDUCTION: This exam was performed according to the departmental dose-optimization program which includes automated exposure control, adjustment of the mA and/or kV according to patient size and/or use of iterative reconstruction technique. COMPARISON:  PET CT 03/02/2023 FINDINGS: Urinary Tract:  Unremarkable Bowel: Partially covered fat stranding surrounds the colon which is likely thick walled. Colonic fluid is seen at the level of the transverse colon. Multiple descending colonic diverticula. No obstructive changes.  Vascular/Lymphatic: Atheromatous calcification of the aorta and iliacs. Reproductive:  Unremarkable for age. Other:  No ascites or pneumoperitoneum. Musculoskeletal: No acute fracture. No hip or pelvic ring dislocation or diastasis. No significant hip degeneration. Subjective osteopenia. IMPRESSION: 1. No acute finding in the pelvis and hips. 2. Descending colitis or diverticulitis Electronically Signed   By: Tiburcio Pea M.D.   On: 05/17/2023 04:16   CT Lumbar Spine Wo Contrast  Result Date: 05/17/2023 CLINICAL DATA:  Fall 3 days ago with worsening pain and leg numbness. EXAM: CT LUMBAR SPINE WITHOUT CONTRAST TECHNIQUE: Multidetector CT imaging of the lumbar spine was performed without intravenous contrast administration. Multiplanar CT image reconstructions were also generated. RADIATION DOSE REDUCTION: This exam was performed according to the departmental dose-optimization program which includes automated exposure control, adjustment of the mA and/or kV according to patient size and/or use of iterative reconstruction technique. COMPARISON:  12/12/2007 FINDINGS: Segmentation: 5 lumbar type vertebrae. Alignment: No traumatic malalignment Vertebrae: Subjective generalized osteopenia. Remote L1 compression fracture with mild retropulsion, healed. No evidence of aggressive bone lesion Paraspinal and other soft tissues: Partial coverage of the descending colon shows apparent submucosal low-density thickening and adjacent fat stranding. Disc levels: Disc space narrowing and endplate ridging at T12-L1 and L1-2. Degenerative facet spurring especially at L4-5 and below. Moderate bilateral foraminal narrowing at L2-3 due to disc height loss and bulging primarily. Mild thecal sac stenosis at the same level. Mild to moderate thecal sac stenosis at L1 due to the longstanding retropulsion. IMPRESSION: 1. Partially covered thickening of the descending colon with  inflammatory features suggesting colitis or diverticulitis,  correlate with abdominal symptoms. 2. No acute finding in the lumbar spine. 3. Remote L1 compression fracture with healed retropulsion causing mild to moderate thecal sac stenosis. Electronically Signed   By: Tiburcio Pea M.D.   On: 05/17/2023 04:14   CT Head Wo Contrast  Result Date: 05/17/2023 CLINICAL DATA:  Head trauma fell 3 days ago with recent worsening pain and leg numbness. EXAM: CT HEAD WITHOUT CONTRAST TECHNIQUE: Contiguous axial images were obtained from the base of the skull through the vertex without intravenous contrast. RADIATION DOSE REDUCTION: This exam was performed according to the departmental dose-optimization program which includes automated exposure control, adjustment of the mA and/or kV according to patient size and/or use of iterative reconstruction technique. COMPARISON:  CT head 05/14/2023 FINDINGS: Brain: No intracranial hemorrhage, mass effect, or evidence of acute infarct. No hydrocephalus. No extra-axial fluid collection. Vascular: No hyperdense vessel or unexpected calcification. Skull: No fracture or focal lesion. Sinuses/Orbits: No acute finding. Other: None. IMPRESSION: No acute intracranial abnormality. Electronically Signed   By: Minerva Fester M.D.   On: 05/17/2023 03:48   DG Chest Portable 1 View  Result Date: 05/17/2023 CLINICAL DATA:  Weakness with nausea, vomiting and diarrhea. EXAM: PORTABLE CHEST 1 VIEW COMPARISON:  July 29, 2020 FINDINGS: The heart size and mediastinal contours are within normal limits. There is no evidence of an acute infiltrate, pleural effusion or pneumothorax. Multilevel degenerative changes are seen throughout the thoracic spine. IMPRESSION: No active cardiopulmonary disease. Electronically Signed   By: Aram Candela M.D.   On: 05/17/2023 02:31    Pending Labs Unresulted Labs (From admission, onward)     Start     Ordered   05/17/23 0500  Magnesium  Tomorrow morning,   R        05/17/23 0358   05/17/23 0057  Urinalysis,  w/ Reflex to Culture (Infection Suspected) -Urine, Clean Catch  Once,   URGENT       Question:  Specimen Source  Answer:  Urine, Clean Catch   05/17/23 0056            Vitals/Pain Today's Vitals   05/17/23 0115 05/17/23 0125 05/17/23 0130 05/17/23 0206  BP: 135/77  (!) 148/97   Pulse:      Resp: 16 16 13    Temp:      TempSrc:      SpO2:      Weight:      Height:      PainSc:    4     Isolation Precautions Enteric precautions (UV disinfection)  Medications Medications  vancomycin (VANCOCIN) capsule 125 mg (125 mg Oral Given 05/17/23 0331)  promethazine (PHENERGAN) 12.5 mg in sodium chloride 0.9 % 50 mL IVPB (12.5 mg Intravenous New Bag/Given 05/17/23 0411)  potassium chloride 10 mEq in 100 mL IVPB (has no administration in time range)  magnesium sulfate IVPB 2 g 50 mL (has no administration in time range)  lactated ringers bolus 1,000 mL (0 mLs Intravenous Stopped 05/17/23 0356)  ondansetron (ZOFRAN) injection 4 mg (4 mg Intravenous Given 05/17/23 0136)  fentaNYL (SUBLIMAZE) injection 50 mcg (50 mcg Intravenous Given 05/17/23 0137)  lactated ringers bolus 1,000 mL (1,000 mLs Intravenous New Bag/Given 05/17/23 0414)    Mobility manual wheelchair     Focused Assessments   R Recommendations: See Admitting Provider Note  Report given to:   Additional Notes: + cdiff. Two loose stools in ED

## 2023-05-17 NOTE — ED Provider Notes (Signed)
[facility] AT [facility] Provider Note   CSN: [id number] Arrival date & time: 05/17/23  0038     History  Chief Complaint  Patient presents with   Emesis    Arrived from home via EMS with c/o nausea, vomiting x5 episodes and diarrhea x7 episodes. Also reports abdominal pain.     Kimberly Beck is a 77 y.o. female.  77 year old female without significant medical problems who presents to the ER today secondary to weakness.  Patient states that she broke her arm after a fall on Monday left the hospital Tuesday morning a sling started all TRAM and lidocaine patches.  Was doing okay.  She ate lunch fine ate supper fine and then this evening she felt like she had to have diarrhea and urinate so she went to the bathroom.  When she would sit down she felt very weak.  She started to get up but that she was having back pain.  So she sat back down and tried to lower self to the floor which she did.  No fall.  But then she was very weak and nauseous show multiple episodes of nonbloody diarrhea but had multiple episodes of dark chunky emesis.  She never had had that before.  Does not taking blood thinners.  She states that she was still too weak to get up EMS was called and she was brought here for further evaluation.  No previous GI bleeds. Did take tramadol earlier in the day.    Emesis      Home Medications Prior to Admission medications   Medication Sig Start Date End Date Taking? Authorizing Provider  acetaminophen (TYLENOL) 500 MG tablet Take 1,000 mg by mouth as needed for mild pain (pain score 1-3), moderate pain (pain score 4-6) or headache.   Yes [provider]  albuterol (VENTOLIN HFA) 108 (90 Base) MCG/ACT inhaler Inhale 2 puffs into the lungs every 6 (six) hours as needed for wheezing or shortness of breath. 05/20/20  Yes Icard, Rachel Bo, DO  aspirin EC 81 MG tablet Take 81 mg by mouth daily. Swallow whole.   Yes [provider]   calcium citrate (CALCITRATE - DOSED IN MG ELEMENTAL CALCIUM) 950 (200 Ca) MG tablet Take 200 mg of elemental calcium by mouth daily.   Yes [provider]  Cholecalciferol (VITAMIN D) 50 MCG (2000 UT) CAPS Take 2,000 Units by mouth daily.   Yes [provider]  diphenhydrAMINE (BENADRYL ALLERGY) 25 MG tablet Take 25 mg by mouth at bedtime as needed for sleep.   Yes [provider]  escitalopram (LEXAPRO) 10 MG tablet Take 10 mg by mouth daily.  04/02/19  Yes [provider]  lidocaine (LIDODERM) 5 % Place 1 patch onto the skin daily. Remove & Discard patch within 12 hours or as directed by MD 05/15/23  Yes Palumbo, April, MD  lisinopril-hydrochlorothiazide (ZESTORETIC) 20-25 MG tablet Take 1 tablet by mouth daily.    Yes [provider]  metoprolol succinate (TOPROL-XL) 25 MG 24 hr tablet TAKE 1 TABLET BY MOUTH DAILY 11/28/22  Yes Runell Gess, MD  montelukast (SINGULAIR) 10 MG tablet Take 10 mg by mouth daily. 01/07/20  Yes [provider]  Multiple Vitamins-Minerals (MULTIVITAMIN WOMEN 50+) TABS Take 1 tablet by mouth daily.   Yes [provider]  pantoprazole (PROTONIX) 40 MG tablet Take 1 tablet (40 mg total) by mouth daily. 03/16/23  Yes Lightfoot, Eliezer Lofts, MD  rosuvastatin (CRESTOR) 5 MG  tablet Take 1 tablet by mouth daily.   Yes [provider]  traMADol (ULTRAM) 50 MG tablet Take 1 tablet (50 mg total) by mouth every 6 (six) hours as needed. 05/15/23  Yes Palumbo, April, MD      Allergies    Codeine, Nsaids, Other, and Penicillins    Review of Systems   Review of Systems  Gastrointestinal:  Positive for vomiting.    Physical Exam Updated Vital Signs BP 137/70 (BP Location: Left Arm)   Pulse 93   Temp 97.8 F (36.6 C) (Oral)   Resp 17   Ht 5\' 6"  (1.676 m)   Wt 69.9 kg   SpO2 100%   BMI 24.86 kg/m  Physical Exam Vitals and nursing note reviewed.  Constitutional:      Appearance: She is  well-developed. She is ill-appearing.  HENT:     Head: Normocephalic and atraumatic.     Nose: No congestion or rhinorrhea.  Cardiovascular:     Rate and Rhythm: Normal rate and regular rhythm.  Pulmonary:     Effort: No respiratory distress.     Breath sounds: No stridor.  Abdominal:     General: There is no distension.     Tenderness: There is no abdominal tenderness. There is no guarding.  Musculoskeletal:        General: Tenderness (r arm in a sling) present.     Cervical back: Normal range of motion.  Skin:    General: Skin is warm and dry.     Coloration: Skin is pale.  Neurological:     General: No focal deficit present.     Mental Status: She is alert.     ED Results / Procedures / Treatments   Labs (all labs ordered are listed, but only abnormal results are displayed) Labs Reviewed  C DIFFICILE QUICK SCREEN W PCR REFLEX   - Abnormal; Notable for the following components:      Result Value   C Diff antigen POSITIVE (*)    All other components within normal limits  CLOSTRIDIUM DIFFICILE BY PCR, REFLEXED - Abnormal; Notable for the following components:   Toxigenic C. Difficile by PCR POSITIVE (*)    All other components within normal limits  CBC WITH DIFFERENTIAL/PLATELET - Abnormal; Notable for the following components:   WBC 12.1 (*)    Neutro Abs 11.0 (*)    Lymphs Abs 0.4 (*)    All other components within normal limits  COMPREHENSIVE METABOLIC PANEL - Abnormal; Notable for the following components:   Potassium 2.9 (*)    CO2 17 (*)    Glucose, Bld 153 (*)    BUN 39 (*)    Creatinine, Ser 1.51 (*)    Calcium 8.6 (*)    Total Protein 4.8 (*)    Albumin 2.7 (*)    AST 42 (*)    Total Bilirubin 1.5 (*)    GFR, Estimated 36 (*)    Anion gap 18 (*)    All other components within normal limits  CK - Abnormal; Notable for the following components:   Total CK 264 (*)    All other components within normal limits  CBG MONITORING, ED - Abnormal; Notable for  the following components:   Glucose-Capillary 160 (*)    All other components within normal limits  PROTIME-INR  URINALYSIS, W/ REFLEX TO CULTURE (INFECTION SUSPECTED)  MAGNESIUM  CBC  COMPREHENSIVE METABOLIC PANEL  CBC WITH DIFFERENTIAL/PLATELET  HEMOGLOBIN AND HEMATOCRIT, BLOOD  POC OCCULT BLOOD,  ED  TYPE AND SCREEN    EKG EKG Interpretation Date/Time:  Thursday May 17 2023 00:46:19 EST Ventricular Rate:  100 PR Interval:  203 QRS Duration:  129 QT Interval:  378 QTC Calculation: 488 R Axis:   5  Text Interpretation: Sinus tachycardia IVCD, consider atypical RBBB Confirmed by Marily Memos [phone]) on 05/17/2023 3:05:26 AM  Radiology CT PELVIS WO CONTRAST  Result Date: 05/17/2023 CLINICAL DATA:  Fall 3 days ago with worsening pain. EXAM: CT PELVIS WITHOUT CONTRAST TECHNIQUE: Multidetector CT imaging of the pelvis was performed following the standard protocol without intravenous contrast. RADIATION DOSE REDUCTION: This exam was performed according to the departmental dose-optimization program which includes automated exposure control, adjustment of the mA and/or kV according to patient size and/or use of iterative reconstruction technique. COMPARISON:  PET CT 03/02/2023 FINDINGS: Urinary Tract:  Unremarkable Bowel: Partially covered fat stranding surrounds the colon which is likely thick walled. Colonic fluid is seen at the level of the transverse colon. Multiple descending colonic diverticula. No obstructive changes. Vascular/Lymphatic: Atheromatous calcification of the aorta and iliacs. Reproductive:  Unremarkable for age. Other:  No ascites or pneumoperitoneum. Musculoskeletal: No acute fracture. No hip or pelvic ring dislocation or diastasis. No significant hip degeneration. Subjective osteopenia. IMPRESSION: 1. No acute finding in the pelvis and hips. 2. Descending colitis or diverticulitis Electronically Signed   By: Tiburcio Pea M.D.   On: 05/17/2023 04:16   CT Lumbar  Spine Wo Contrast  Result Date: 05/17/2023 CLINICAL DATA:  Fall 3 days ago with worsening pain and leg numbness. EXAM: CT LUMBAR SPINE WITHOUT CONTRAST TECHNIQUE: Multidetector CT imaging of the lumbar spine was performed without intravenous contrast administration. Multiplanar CT image reconstructions were also generated. RADIATION DOSE REDUCTION: This exam was performed according to the departmental dose-optimization program which includes automated exposure control, adjustment of the mA and/or kV according to patient size and/or use of iterative reconstruction technique. COMPARISON:  12/12/2007 FINDINGS: Segmentation: 5 lumbar type vertebrae. Alignment: No traumatic malalignment Vertebrae: Subjective generalized osteopenia. Remote L1 compression fracture with mild retropulsion, healed. No evidence of aggressive bone lesion Paraspinal and other soft tissues: Partial coverage of the descending colon shows apparent submucosal low-density thickening and adjacent fat stranding. Disc levels: Disc space narrowing and endplate ridging at T12-L1 and L1-2. Degenerative facet spurring especially at L4-5 and below. Moderate bilateral foraminal narrowing at L2-3 due to disc height loss and bulging primarily. Mild thecal sac stenosis at the same level. Mild to moderate thecal sac stenosis at L1 due to the longstanding retropulsion. IMPRESSION: 1. Partially covered thickening of the descending colon with inflammatory features suggesting colitis or diverticulitis, correlate with abdominal symptoms. 2. No acute finding in the lumbar spine. 3. Remote L1 compression fracture with healed retropulsion causing mild to moderate thecal sac stenosis. Electronically Signed   By: Tiburcio Pea M.D.   On: 05/17/2023 04:14   CT Head Wo Contrast  Result Date: 05/17/2023 CLINICAL DATA:  Head trauma fell 3 days ago with recent worsening pain and leg numbness. EXAM: CT HEAD WITHOUT CONTRAST TECHNIQUE: Contiguous axial images were  obtained from the base of the skull through the vertex without intravenous contrast. RADIATION DOSE REDUCTION: This exam was performed according to the departmental dose-optimization program which includes automated exposure control, adjustment of the mA and/or kV according to patient size and/or use of iterative reconstruction technique. COMPARISON:  CT head 05/14/2023 FINDINGS: Brain: No intracranial hemorrhage, mass effect, or evidence of acute infarct. No hydrocephalus. No extra-axial fluid collection. Vascular:  No hyperdense vessel or unexpected calcification. Skull: No fracture or focal lesion. Sinuses/Orbits: No acute finding. Other: None. IMPRESSION: No acute intracranial abnormality. Electronically Signed   By: Minerva Fester M.D.   On: 05/17/2023 03:48   DG Chest Portable 1 View  Result Date: 05/17/2023 CLINICAL DATA:  Weakness with nausea, vomiting and diarrhea. EXAM: PORTABLE CHEST 1 VIEW COMPARISON:  July 29, 2020 FINDINGS: The heart size and mediastinal contours are within normal limits. There is no evidence of an acute infiltrate, pleural effusion or pneumothorax. Multilevel degenerative changes are seen throughout the thoracic spine. IMPRESSION: No active cardiopulmonary disease. Electronically Signed   By: Aram Candela M.D.   On: 05/17/2023 02:31    Procedures Procedures    Medications Ordered in ED Medications  vancomycin (VANCOCIN) capsule 125 mg (125 mg Oral Given 05/17/23 0331)  promethazine (PHENERGAN) 12.5 mg in sodium chloride 0.9 % 50 mL IVPB (0 mg Intravenous Stopped 05/17/23 0433)  magnesium sulfate IVPB 2 g 50 mL (has no administration in time range)  heparin injection 5,000 Units (5,000 Units Subcutaneous Given 05/17/23 0554)  acetaminophen (TYLENOL) tablet 650 mg (has no administration in time range)    Or  acetaminophen (TYLENOL) suppository 650 mg (has no administration in time range)  HYDROmorphone (DILAUDID) injection 0.5 mg (has no administration in  time range)  oxyCODONE (Oxy IR/ROXICODONE) immediate release tablet 5 mg (has no administration in time range)  0.9 %  sodium chloride infusion (has no administration in time range)  potassium chloride 10 mEq in 100 mL IVPB (has no administration in time range)  loperamide (IMODIUM) capsule 2 mg (has no administration in time range)  famotidine (PEPCID) IVPB 20 mg premix (has no administration in time range)  lactated ringers bolus 1,000 mL (0 mLs Intravenous Stopped 05/17/23 0356)  ondansetron (ZOFRAN) injection 4 mg (4 mg Intravenous Given 05/17/23 0136)  fentaNYL (SUBLIMAZE) injection 50 mcg (50 mcg Intravenous Given 05/17/23 0137)  lactated ringers bolus 1,000 mL (1,000 mLs Intravenous New Bag/Given 05/17/23 0414)  potassium chloride 10 mEq in 100 mL IVPB (0 mEq Intravenous Stopping previously hung infusion 05/17/23 6962)    ED Course/ Medical Decision Making/ A&P                                 Medical Decision Making Amount and/or Complexity of Data Reviewed Labs: ordered. Radiology: ordered. ECG/medicine tests: ordered.  Risk Prescription drug management. Decision regarding hospitalization.  Appears slightly dehydrated.  Tachycardic, dry mouth, actively vomiting and having diarrhea so we will fluid resuscitate till emesis improves.  Also check type and screen hemoglobin to make sure this black stuff was not acute blood loss Hemoccult whenever she has a bowel movement along with C. difficile.  Check basic labs for dehydration and anemia.  Urinalysis and chest x-ray for infection.  Overall patient appears that she feels unwell but is not toxic appearing. Patient found to have C. difficile along with AKI and mild hypokalemia.  Fluids repleted.  Persistently nauseous.  Not tolerating p.o.  Potassium ordered.  Oral vancomycin ordered for moderate C. difficile per our protocol.  She is still having difficulty tolerating p.o. and would need oral antibiotics at home along with potassium  repletion discussed with hospitalist for admission.  Final Clinical Impression(s) / ED Diagnoses Final diagnoses:  C. difficile diarrhea  AKI (acute kidney injury) (HCC)  Nausea and vomiting, unspecified vomiting type    Rx / DC Orders  ED Discharge Orders     None         Jamesmichael Shadd, Barbara Cower, MD 05/17/23 [phone]

## 2023-05-17 NOTE — H&P (Addendum)
PCP:   Jarrett Soho, PA-C   Chief Complaint:  gastroenterititis  HPI: This isa 77 y/o female w/ PMHx of lung cancer, HTN, HLD, tachycardia. She was recent in the ER 11/12 after a fall where she broke her right  cervical collar bone. She is in a sling and has an appointment with orthopedic outpatient today.she has chronic lower back pain that is typically treated w/ tylenol, since her fall her back pain has become severe. She denies sciatic symptoms or inability to control bowel or bladder.  Last night she developed severe nausea, vomiting and diarrhea. Her back pain was too severe for her to sit up continuously and she felt so weak,, she laid on the floor ending up with emesis and diarrhea all around her. She was on the floor for 2 hours. 911 was called and she was brought to the ER. She denies recent antibiotic use. She is maintained on Protonix. She endorses abdominal pain but denies fevers or chills. She states her emesis was dark in color.  In th e ER patients vitals are stable. She is C diff positive. Here WBC12, K 2.9, creatinine 1.51. Admission requested  Review of Systems:  Per HPI  Past Medical History: Past Medical History:  Diagnosis Date   Allergy    Anemia    Cancer (HCC)    Complication of anesthesia    Depression    DVT, lower extremity, recurrent, right (HCC)    History of hiatal hernia    HTN (hypertension)    Lung cancer (HCC)    Right   Pneumonia    PONV (postoperative nausea and vomiting)    N/V only after receiving ether.   Pre-diabetes    BS checks daily, takes no medicines   Past Surgical History:  Procedure Laterality Date   ESOPHAGOGASTRODUODENOSCOPY N/A 07/29/2020   Procedure: ESOPHAGOGASTRODUODENOSCOPY (EGD);  Surgeon: Corliss Skains, MD;  Location: [facility] OR;  Service: Thoracic;  Laterality: N/A;   EYE SURGERY     Bilateral cataract removal   INTERCOSTAL NERVE BLOCK Right 04/29/2020   Procedure: INTERCOSTAL NERVE BLOCK;  Surgeon: Corliss Skains, MD;  Location: [facility] OR;  Service: Thoracic;  Laterality: Right;   LYMPH NODE DISSECTION Right 04/29/2020   Procedure: LYMPH NODE DISSECTION;  Surgeon: Corliss Skains, MD;  Location: [facility] OR;  Service: Thoracic;  Laterality: Right;   TONSILLECTOMY     TONSILLECTOMY AND ADENOIDECTOMY     TUBAL LIGATION     XI ROBOTIC ASSISTED PARAESOPHAGEAL HERNIA REPAIR N/A 07/29/2020   Procedure: XI ROBOTIC ASSISTED PARAESOPHAGEAL HERNIA REPAIR;  Surgeon: Corliss Skains, MD;  Location: [facility] OR;  Service: Thoracic;  Laterality: N/A;    Medications: Prior to Admission medications   Medication Sig Start Date End Date Taking? Authorizing Provider  albuterol (VENTOLIN HFA) 108 (90 Base) MCG/ACT inhaler Inhale 2 puffs into the lungs every 6 (six) hours as needed for wheezing or shortness of breath. 05/20/20   Josephine Igo, DO  aspirin EC 81 MG tablet Take 81 mg by mouth daily. Swallow whole.    [provider]  Azelastine HCl 0.15 % SOLN Place 1 spray into both nostrils in the morning and at bedtime. 12/25/19   [provider]  calcium citrate (CALCITRATE - DOSED IN MG ELEMENTAL CALCIUM) 950 (200 Ca) MG tablet Take 200 mg of elemental calcium by mouth daily.    [provider]  Cholecalciferol (VITAMIN D) 50 MCG (2000 UT) CAPS Take 2,000 Units by mouth daily.  [provider]  clonazePAM (KLONOPIN) 1 MG tablet 1 tablet    [provider]  escitalopram (LEXAPRO) 10 MG tablet Take 10 mg by mouth daily.  04/02/19   [provider]  lidocaine (LIDODERM) 5 % Place 1 patch onto the skin daily. Remove & Discard patch within 12 hours or as directed by MD 05/15/23   Nicanor Alcon, April, MD  lisinopril-hydrochlorothiazide (ZESTORETIC) 20-25 MG tablet Take 1 tablet by mouth daily.     [provider]  metoprolol succinate (TOPROL-XL) 25 MG 24 hr tablet TAKE 1 TABLET BY MOUTH DAILY 11/28/22   Runell Gess, MD  montelukast (SINGULAIR) 10 MG tablet  Take 10 mg by mouth daily. 01/07/20   [provider]  Multiple Vitamin (MULTIVITAMIN) tablet Take 1 tablet by mouth daily.    [provider]  pantoprazole (PROTONIX) 40 MG tablet Take 1 tablet (40 mg total) by mouth daily. 03/16/23   Lightfoot, Eliezer Lofts, MD  rosuvastatin (CRESTOR) 5 MG tablet Take 1 tablet by mouth daily.    [provider]  traMADol (ULTRAM) 50 MG tablet Take 1 tablet (50 mg total) by mouth every 6 (six) hours as needed. 05/15/23   Palumbo, April, MD    Allergies:   Allergies  Allergen Reactions   Codeine Itching    Drunk feeling    Nsaids Other (See Comments)    Liver    Other     Other reaction(s): diarrhea and headache Other reaction(s): Global anesthesia Other reaction(s): edema   Penicillins Rash    Dry rash    Social History:  reports that she quit smoking about 37 years ago. Her smoking use included cigarettes. She started smoking about 62 years ago. She has a 75 pack-year smoking history. She has never used smokeless tobacco. She reports that she does not drink alcohol and does not use drugs.  Family History: Family History  Problem Relation Age of Onset   Heart attack Father    Stroke Maternal Grandfather     Physical Exam: Vitals:   05/17/23 0103 05/17/23 0115 05/17/23 0125 05/17/23 0130  BP:  135/77  (!) 148/97  Pulse:      Resp:  16 16 13   Temp:      TempSrc:      SpO2:      Weight: 69.9 kg     Height: 5\' 6"  (1.676 m)       General:  A&O x3, well developed and nourished, no acute distress Eyes: Pink conjunctiva, no scleral icterus ENT: Moist oral mucosa, neck supple, no thyromegaly Lungs: CTA B/L, no wheeze, no crackles, no use of accessory muscles Cardiovascular: RRR, no regurgitation, no gallops, no murmurs. Abdomen: soft, positive BS, + generalized TTP.  Not an acute abdomen GU: not examined Neuro: CN II - XII grossly intact, sensation intact Musculoskeletal: strength 5/5 all extremities, no edema Skin:  no rash, no subcutaneous crepitation, no decubitus Psych: appropriate patient  Labs on Admission:  Recent Labs    05/17/23 0129  NA 135  K 2.9*  CL 100  CO2 17*  GLUCOSE 153*  BUN 39*  CREATININE 1.51*  CALCIUM 8.6*   Recent Labs    05/17/23 0129  AST 42*  ALT 22  ALKPHOS 48  BILITOT 1.5*  PROT 4.8*  ALBUMIN 2.7*    Recent Labs    05/17/23 0129  WBC 12.1*  NEUTROABS 11.0*  HGB 12.8  HCT 40.6  MCV 92.1  PLT 178   Recent Labs  05/17/23 0129  CKTOTAL 264*    Micro Results: Recent Results (from the past 240 hour(s))  C Difficile Quick Screen w PCR reflex     Status: Abnormal   Collection Time: 05/17/23  1:48 AM   Specimen: STOOL  Result Value Ref Range Status   C Diff antigen POSITIVE (A) NEGATIVE Final   C Diff toxin NEGATIVE NEGATIVE Final   C Diff interpretation Results are indeterminate. See PCR results.  Final    Comment: Performed at [facility], [address], [place], [place] [address]  C. Diff by PCR, Reflexed     Status: Abnormal   Collection Time: 05/17/23  1:48 AM  Result Value Ref Range Status   Toxigenic C. Difficile by PCR POSITIVE (A) NEGATIVE Final    Comment: Positive for toxigenic C. difficile with little to no toxin production. Only treat if clinical presentation suggests symptomatic illness. Performed at [facility], [address] [place], [place] [address]      Radiological Exams on Admission: CT Head Wo Contrast  Result Date: 05/17/2023 CLINICAL DATA:  Head trauma fell 3 days ago with recent worsening pain and leg numbness. EXAM: CT HEAD WITHOUT CONTRAST TECHNIQUE: Contiguous axial images were obtained from the base of the skull through the vertex without intravenous contrast. RADIATION DOSE REDUCTION: This exam was performed according to the departmental dose-optimization program which includes automated exposure control, adjustment of the mA and/or kV according to patient size and/or use of iterative  reconstruction technique. COMPARISON:  CT head 05/14/2023 FINDINGS: Brain: No intracranial hemorrhage, mass effect, or evidence of acute infarct. No hydrocephalus. No extra-axial fluid collection. Vascular: No hyperdense vessel or unexpected calcification. Skull: No fracture or focal lesion. Sinuses/Orbits: No acute finding. Other: None. IMPRESSION: No acute intracranial abnormality. Electronically Signed   By: Minerva Fester M.D.   On: 05/17/2023 03:48   DG Chest Portable 1 View  Result Date: 05/17/2023 CLINICAL DATA:  Weakness with nausea, vomiting and diarrhea. EXAM: PORTABLE CHEST 1 VIEW COMPARISON:  July 29, 2020 FINDINGS: The heart size and mediastinal contours are within normal limits. There is no evidence of an acute infiltrate, pleural effusion or pneumothorax. Multilevel degenerative changes are seen throughout the thoracic spine. IMPRESSION: No active cardiopulmonary disease. Electronically Signed   By: Aram Candela M.D.   On: 05/17/2023 02:31    Assessment/Plan Present on Admission:  C Diff diarrhea -C diff protocol initiated by ADP -Patient on PO vancomycin and in isolation -PRN pain meds -Antiemetic and antidiarrheal initiated   Acute on chronic Kidney injury -IVF hydration ordered -CMP in AM -last creatinine recorded from 2022, exact current baseline creatinine unknown   Hypokalemia -Repleting. Magnesium level ordered -F/u labs ordered   Back pain -CT lumbar spine ordered   Right cervical collar fracture -Patient w/ outpatient ortho appointment today   Dark emesis -Hgb 12.8. will order Hgb level q12 x 2. Defer to AM team GI consult if needed -Pepcid IV BiD ordered. PPI not ordered as it can be associated with C diff diarrhea   Hyperlipidemia -Statin on hold -mildly elevated LFTs. CMP in AM   HTN //  Tachycardia -Zestoretic on hold. Metoprolol resumed   H/o Lung cancer  Roe Wilner 05/17/2023, 4:10 AM

## 2023-05-17 NOTE — Care Management (Signed)
Transition of Care [facility]) - Inpatient Brief Assessment   Patient Details  Name: Kimberly Beck MRN: [id number] Date of Birth: 1946-06-22  Transition of Care [facility] CM/SW Contact:    Lockie Pares, RN Phone Number: 05/17/2023, 2:18 PM   Clinical Narrative:  Patient presented with 1 day of loose stools, had previous fall with fx, patient in a sling. Patient is weak from loose stools, C-Diff positive, no known antibiotics recently.   The patient will be discussed at daily progressive rounds, if a needs is identified, please place a TOC consult.   Transition of Care Asessment: Insurance and Status: Insurance coverage has been reviewed Patient has primary care physician: Yes Home environment has been reviewed: [place] family home self Prior level of function:: Independent Prior/Current Home Services: No current home services Social Determinants of Health Reivew: SDOH reviewed no interventions necessary Readmission risk has been reviewed: Yes Transition of care needs: no transition of care needs at this time

## 2023-05-17 NOTE — ED Notes (Signed)
Patient transported to CT 

## 2023-05-17 NOTE — Progress Notes (Signed)
[facility]                                                                               Kimberly Beck, is a 77 y.o. female, DOB - [birth year], ZOX:[id number] Admit date - 05/17/2023    Outpatient Primary MD for the patient is Jarrett Soho, PA-C  LOS - 0  days    Brief summary   77 year old lady with prior history of L1 compression fracture, back pain, hypertension, hyperlipidemia, recent fall with right humerus fracture presents today with nausea vomiting and diarrhea. She was found to be C. difficile positive and admitted for C. difficile colitis. She was also found to be severely hypokalemic and dehydrated and in AKI  Assessment & Plan    Assessment and Plan:   C. difficile colitis Started her on oral vancomycin and IV fluids Symptomatic management with IV antiemetics and pain control   AKI Baseline creatinine appears to be between 0.9-1.2 she was admitted with a creatinine of 1.5. Started the patient on IV fluids and repeat renal parameters in the morning    Severe hypokalemia Replaced Repeat in the morning   Hypertension Blood pressure parameters appear to be optimal  Right humerus fracture - in a sling, outpatient follow up with orthopedics recommended.  Pain control.    Back pain/remote L1 compression fracture  CT lumbar spine without contrast showed Remote L1 compression fracture with healed retropulsion causing mild to moderate thecal sac stenosis.  Estimated body mass index is 24.86 kg/m as calculated from the following:   Height as of this encounter: 5\' 6"  (1.676 m).   Weight as of this encounter: 69.9 kg.  Code Status: full code.  DVT Prophylaxis:  heparin injection 5,000 Units Start: 05/17/23 0600   Level of Care: Level of care: Med-Surg Family Communication: none at bedside.   Disposition Plan:     Remains inpatient appropriate:  IV fluids.   Procedures:  None.   Consultants:   None.   Antimicrobials:    Anti-infectives (From admission, onward)    Start     Dose/Rate Route Frequency Ordered Stop   05/17/23 1000  vancomycin (VANCOCIN) capsule 125 mg  Status:  Discontinued        125 mg Oral 4 times daily 05/17/23 0307 05/17/23 0314   05/17/23 0315  vancomycin (VANCOCIN) capsule 125 mg        125 mg Oral 4 times daily 05/17/23 0307 05/26/23 2159        Medications  Scheduled Meds:  heparin  5,000 Units Subcutaneous Q8H   potassium chloride  40 mEq Oral BID   vancomycin  125 mg Oral QID   Continuous Infusions:  sodium chloride 75 mL/hr at 05/17/23 0815   famotidine (PEPCID) IV 20 mg (05/17/23 0815)   magnesium sulfate     promethazine (PHENERGAN) injection (IM or IVPB) Stopped (05/17/23 0433)   PRN Meds:.acetaminophen **OR** acetaminophen, HYDROmorphone (DILAUDID) injection, loperamide, oxyCODONE, promethazine (PHENERGAN) injection (IM or IVPB)    Subjective:   Kimberly Beck was seen and examined today.  Some back pain .   Objective:   Vitals:   05/17/23 0500 05/17/23 0532 05/17/23 0816 05/17/23 0817  BP:  137/70 (!) 158/66 (!) 158/66  Pulse:  93 77 84  Resp:  17 16 16   Temp: 97.8 F (36.6 C) 97.8 F (36.6 C) 98.2 F (36.8 C) 98.2 F (36.8 C)  TempSrc: Oral Oral Oral Oral  SpO2:  100% 100% 100%  Weight:      Height:        Intake/Output Summary (Last 24 hours) at 05/17/2023 1329 Last data filed at 05/17/2023 0700 Gross per 24 hour  Intake 1050 ml  Output 1 ml  Net 1049 ml   Filed Weights   05/17/23 0103  Weight: 69.9 kg     Exam General exam: Appears calm and comfortable  Respiratory system: Clear to auscultation. Respiratory effort normal. Cardiovascular system: S1 & S2 heard, RRR. No JVD, murmurs, rubs, gallops or clicks. No pedal edema. Gastrointestinal system: Abdomen is SOFT, mild gen tenderness.  Central nervous system: Alert and oriented. No focal neurological deficits. Extremities: Symmetric 5 x 5 power. Skin: No rashes, lesions or  ulcers Psychiatry: Judgement and insight appear normal. Mood & affect appropriate.     Data Reviewed:  I have personally reviewed following labs and imaging studies   CBC Lab Results  Component Value Date   WBC 12.1 (H) 05/17/2023   RBC 4.41 05/17/2023   HGB 12.6 05/17/2023   HCT 39.5 05/17/2023   MCV 92.1 05/17/2023   MCH 29.0 05/17/2023   PLT 178 05/17/2023   MCHC 31.5 05/17/2023   RDW 14.6 05/17/2023   LYMPHSABS 0.4 (L) 05/17/2023   MONOABS 0.5 05/17/2023   EOSABS 0.0 05/17/2023   BASOSABS 0.0 05/17/2023     Last metabolic panel Lab Results  Component Value Date   NA 141 05/17/2023   K 3.9 05/17/2023   CL 107 05/17/2023   CO2 24 05/17/2023   BUN 42 (H) 05/17/2023   CREATININE 1.44 (H) 05/17/2023   GLUCOSE 156 (H) 05/17/2023   GFRNONAA 38 (L) 05/17/2023   CALCIUM 8.5 (L) 05/17/2023   PROT 5.6 (L) 05/17/2023   ALBUMIN 2.8 (L) 05/17/2023   BILITOT 1.1 05/17/2023   ALKPHOS 46 05/17/2023   AST 40 05/17/2023   ALT 22 05/17/2023   ANIONGAP 10 05/17/2023    CBG (last 3)  Recent Labs    05/17/23 0116  GLUCAP 160*      Coagulation Profile: Recent Labs  Lab 05/17/23 0129  INR 1.1     Radiology Studies: CT PELVIS WO CONTRAST  Result Date: 05/17/2023 CLINICAL DATA:  Fall 3 days ago with worsening pain. EXAM: CT PELVIS WITHOUT CONTRAST TECHNIQUE: Multidetector CT imaging of the pelvis was performed following the standard protocol without intravenous contrast. RADIATION DOSE REDUCTION: This exam was performed according to the departmental dose-optimization program which includes automated exposure control, adjustment of the mA and/or kV according to patient size and/or use of iterative reconstruction technique. COMPARISON:  PET CT 03/02/2023 FINDINGS: Urinary Tract:  Unremarkable Bowel: Partially covered fat stranding surrounds the colon which is likely thick walled. Colonic fluid is seen at the level of the transverse colon. Multiple descending colonic  diverticula. No obstructive changes. Vascular/Lymphatic: Atheromatous calcification of the aorta and iliacs. Reproductive:  Unremarkable for age. Other:  No ascites or pneumoperitoneum. Musculoskeletal: No acute fracture. No hip or pelvic ring dislocation or diastasis. No significant hip degeneration. Subjective osteopenia. IMPRESSION: 1. No acute finding in the pelvis and hips. 2. Descending colitis or diverticulitis Electronically Signed   By: Tiburcio Pea M.D.   On: 05/17/2023 04:16   CT Lumbar Spine  Wo Contrast  Result Date: 05/17/2023 CLINICAL DATA:  Fall 3 days ago with worsening pain and leg numbness. EXAM: CT LUMBAR SPINE WITHOUT CONTRAST TECHNIQUE: Multidetector CT imaging of the lumbar spine was performed without intravenous contrast administration. Multiplanar CT image reconstructions were also generated. RADIATION DOSE REDUCTION: This exam was performed according to the departmental dose-optimization program which includes automated exposure control, adjustment of the mA and/or kV according to patient size and/or use of iterative reconstruction technique. COMPARISON:  12/12/2007 FINDINGS: Segmentation: 5 lumbar type vertebrae. Alignment: No traumatic malalignment Vertebrae: Subjective generalized osteopenia. Remote L1 compression fracture with mild retropulsion, healed. No evidence of aggressive bone lesion Paraspinal and other soft tissues: Partial coverage of the descending colon shows apparent submucosal low-density thickening and adjacent fat stranding. Disc levels: Disc space narrowing and endplate ridging at T12-L1 and L1-2. Degenerative facet spurring especially at L4-5 and below. Moderate bilateral foraminal narrowing at L2-3 due to disc height loss and bulging primarily. Mild thecal sac stenosis at the same level. Mild to moderate thecal sac stenosis at L1 due to the longstanding retropulsion. IMPRESSION: 1. Partially covered thickening of the descending colon with inflammatory features  suggesting colitis or diverticulitis, correlate with abdominal symptoms. 2. No acute finding in the lumbar spine. 3. Remote L1 compression fracture with healed retropulsion causing mild to moderate thecal sac stenosis. Electronically Signed   By: Tiburcio Pea M.D.   On: 05/17/2023 04:14   CT Head Wo Contrast  Result Date: 05/17/2023 CLINICAL DATA:  Head trauma fell 3 days ago with recent worsening pain and leg numbness. EXAM: CT HEAD WITHOUT CONTRAST TECHNIQUE: Contiguous axial images were obtained from the base of the skull through the vertex without intravenous contrast. RADIATION DOSE REDUCTION: This exam was performed according to the departmental dose-optimization program which includes automated exposure control, adjustment of the mA and/or kV according to patient size and/or use of iterative reconstruction technique. COMPARISON:  CT head 05/14/2023 FINDINGS: Brain: No intracranial hemorrhage, mass effect, or evidence of acute infarct. No hydrocephalus. No extra-axial fluid collection. Vascular: No hyperdense vessel or unexpected calcification. Skull: No fracture or focal lesion. Sinuses/Orbits: No acute finding. Other: None. IMPRESSION: No acute intracranial abnormality. Electronically Signed   By: Minerva Fester M.D.   On: 05/17/2023 03:48   DG Chest Portable 1 View  Result Date: 05/17/2023 CLINICAL DATA:  Weakness with nausea, vomiting and diarrhea. EXAM: PORTABLE CHEST 1 VIEW COMPARISON:  July 29, 2020 FINDINGS: The heart size and mediastinal contours are within normal limits. There is no evidence of an acute infiltrate, pleural effusion or pneumothorax. Multilevel degenerative changes are seen throughout the thoracic spine. IMPRESSION: No active cardiopulmonary disease. Electronically Signed   By: Aram Candela M.D.   On: 05/17/2023 02:31       Kathlen Mody M.D. [facility] 05/17/2023, 1:29 PM  Available via Epic secure chat 7am-7pm After 7 pm, please refer to night  coverage provider listed on amion.

## 2023-05-17 NOTE — Plan of Care (Signed)

## 2023-05-17 NOTE — ED Notes (Signed)
ED Provider at bedside. 

## 2023-05-17 NOTE — ED Notes (Signed)
Pt returned to room from CT

## 2023-05-18 ENCOUNTER — Other Ambulatory Visit (HOSPITAL_COMMUNITY): Payer: Self-pay

## 2023-05-18 DIAGNOSIS — E876 Hypokalemia: Secondary | ICD-10-CM | POA: Diagnosis not present

## 2023-05-18 DIAGNOSIS — A0472 Enterocolitis due to Clostridium difficile, not specified as recurrent: Secondary | ICD-10-CM | POA: Diagnosis not present

## 2023-05-18 DIAGNOSIS — N179 Acute kidney failure, unspecified: Secondary | ICD-10-CM | POA: Diagnosis not present

## 2023-05-18 DIAGNOSIS — I10 Essential (primary) hypertension: Secondary | ICD-10-CM | POA: Diagnosis not present

## 2023-05-18 LAB — BASIC METABOLIC PANEL
Anion gap: 8 (ref 5–15)
BUN: 19 mg/dL (ref 8–23)
CO2: 21 mmol/L — ABNORMAL LOW (ref 22–32)
Calcium: 7.6 mg/dL — ABNORMAL LOW (ref 8.9–10.3)
Chloride: 108 mmol/L (ref 98–111)
Creatinine, Ser: 0.89 mg/dL (ref 0.44–1.00)
GFR, Estimated: >60 mL/min (ref >60)
Glucose, Bld: 122 mg/dL — ABNORMAL HIGH (ref 70–99)
Potassium: 3.9 mmol/L (ref 3.5–5.1)
Sodium: 137 mmol/L (ref 135–145)

## 2023-05-18 LAB — HEMOGLOBIN AND HEMATOCRIT, BLOOD
HCT: 32.6 % — ABNORMAL LOW (ref 36.0–46.0)
HCT: 35.3 % — ABNORMAL LOW (ref 36.0–46.0)
Hemoglobin: 10.4 g/dL — ABNORMAL LOW (ref 12.0–15.0)
Hemoglobin: 11.1 g/dL — ABNORMAL LOW (ref 12.0–15.0)

## 2023-05-18 MED ORDER — FAMOTIDINE 20 MG PO TABS
20.0000 mg | ORAL_TABLET | Freq: Every day | ORAL | Status: DC
  Administered 2023-05-19 – 2023-05-22 (×4): 20 mg via ORAL
  Filled 2023-05-18 (×4): qty 1

## 2023-05-18 MED ORDER — MAGNESIUM SULFATE 2 GM/50ML IV SOLN
2.0000 g | Freq: Once | INTRAVENOUS | Status: AC
  Administered 2023-05-18: 2 g via INTRAVENOUS
  Filled 2023-05-18: qty 50

## 2023-05-18 NOTE — TOC Benefit Eligibility Note (Signed)
Patient Product/process development scientist completed.    The patient is insured through [facility]. Patient has Medicare and is not eligible for a copay card, but may be able to apply for patient assistance, if available.    Ran test claim for vancomycin 125 mg and the current 10 day co-pay is $100.00.   This test claim was processed through [facility]- copay amounts may vary at other pharmacies due to pharmacy/plan contracts, or as the patient moves through the different stages of their insurance plan.     Roland Earl, CPHT Pharmacy Technician III Certified Patient Advocate [facility] Patient Advocate Team Direct Number: [phone]  Fax: [phone]

## 2023-05-18 NOTE — Plan of Care (Signed)

## 2023-05-18 NOTE — Progress Notes (Signed)
[facility]                                                                               Kimberly Beck, is a 77 y.o. female, DOB - [birth year], WUX:[id number] Admit date - 05/17/2023    Outpatient Primary MD for the patient is Kimberly Soho, PA-C  LOS - 1  days    Brief summary   77 year old lady with prior history of L1 compression fracture, back pain, hypertension, hyperlipidemia, recent fall with right humerus fracture presents today with nausea vomiting and diarrhea. She was found to be C. difficile positive and admitted for C. difficile colitis. She was also found to be severely hypokalemic and dehydrated and in AKI  Assessment & Plan    Assessment and Plan:   C. difficile colitis Started her on oral vancomycin and IV fluids Symptomatic management with IV antiemetics and pain control. Her diarrhea appears to be improving.    AKI Baseline creatinine appears to be between 0.9-1.2 she was admitted with a creatinine of 1.5. Started the patient on IV fluids and repeat renal parameters show improvement to 1.4, . Recommended to take fluids by mouth.     Severe hypokalemia Replaced Repeat in the morning   Hypomagnesemia Replaced.    Hypertension Blood pressure parameters are well controlled.   Right humerus fracture - in a sling, outpatient follow up with orthopedics recommended.  - NWB of the right humerus fracture until seen by orthopedics.  Pain control.    Back pain/remote L1 compression fracture  CT lumbar spine without contrast showed Remote L1 compression fracture with healed retropulsion causing mild to moderate thecal sac stenosis. Pain control and therapy evaluations ordered.   Estimated body mass index is 24.86 kg/m as calculated from the following:   Height as of this encounter: 5\' 6"  (1.676 m).   Weight as of this encounter: 69.9 kg.  Code Status: full code.  DVT Prophylaxis:  heparin injection 5,000 Units Start: 05/17/23  0600   Level of Care: Level of care: Med-Surg Family Communication: none at bedside.   Disposition Plan:     Remains inpatient appropriate:  not medically stable .   Procedures:  None.   Consultants:   None.   Antimicrobials:   Anti-infectives (From admission, onward)    Start     Dose/Rate Route Frequency Ordered Stop   05/17/23 1000  vancomycin (VANCOCIN) capsule 125 mg  Status:  Discontinued        125 mg Oral 4 times daily 05/17/23 0307 05/17/23 0314   05/17/23 0315  vancomycin (VANCOCIN) capsule 125 mg        125 mg Oral 4 times daily 05/17/23 0307 05/26/23 2159        Medications  Scheduled Meds:  heparin  5,000 Units Subcutaneous Q8H   vancomycin  125 mg Oral QID   Continuous Infusions:  famotidine (PEPCID) IV 20 mg (05/18/23 1028)   magnesium sulfate     promethazine (PHENERGAN) injection (IM or IVPB) Stopped (05/17/23 0433)   PRN Meds:.acetaminophen **OR** acetaminophen, HYDROmorphone (DILAUDID) injection, loperamide, oxyCODONE, promethazine (PHENERGAN) injection (IM or IVPB)    Subjective:   [name] was seen and examined  today.  Back pain is improving.  Diarrhea is improving.   Objective:   Vitals:   05/17/23 2012 05/18/23 0022 05/18/23 0533 05/18/23 0736  BP: (!) 117/57 126/67 131/70 131/76  Pulse: (!) 106 (!) 108 (!) 104 (!) 103  Resp: 20 18 16 16   Temp: 98.6 F (37 C) 99.1 F (37.3 C) 98.8 F (37.1 C) 99.6 F (37.6 C)  TempSrc: Oral Oral Oral Oral  SpO2: 98% 100% 98% 95%  Weight:      Height:        Intake/Output Summary (Last 24 hours) at 05/18/2023 1436 Last data filed at 05/18/2023 1191 Gross per 24 hour  Intake 1675.37 ml  Output --  Net 1675.37 ml   Filed Weights   05/17/23 0103  Weight: 69.9 kg     Exam General exam: Appears calm and comfortable  Respiratory system: Clear to auscultation. Respiratory effort normal. Cardiovascular system: S1 & S2 heard, RRR. No JVD, Gastrointestinal system: Abdomen is soft,  left lower quadrant tenderness present.  Central nervous system: Alert and oriented. No focal neurological deficits. Extremities: Symmetric 5 x 5 power. Skin: No rashes, lesions or ulcers Psychiatry: Judgement and insight appear normal. Mood & affect appropriate.     Data Reviewed:  I have personally reviewed following labs and imaging studies   CBC Lab Results  Component Value Date   WBC 12.1 (H) 05/17/2023   RBC 4.41 05/17/2023   HGB 11.1 (L) 05/18/2023   HCT 35.3 (L) 05/18/2023   MCV 92.1 05/17/2023   MCH 29.0 05/17/2023   PLT 178 05/17/2023   MCHC 31.5 05/17/2023   RDW 14.6 05/17/2023   LYMPHSABS 0.4 (L) 05/17/2023   MONOABS 0.5 05/17/2023   EOSABS 0.0 05/17/2023   BASOSABS 0.0 05/17/2023     Last metabolic panel Lab Results  Component Value Date   NA 141 05/17/2023   K 3.9 05/17/2023   CL 107 05/17/2023   CO2 24 05/17/2023   BUN 42 (H) 05/17/2023   CREATININE 1.44 (H) 05/17/2023   GLUCOSE 156 (H) 05/17/2023   GFRNONAA 38 (L) 05/17/2023   CALCIUM 8.5 (L) 05/17/2023   PROT 5.6 (L) 05/17/2023   ALBUMIN 2.8 (L) 05/17/2023   BILITOT 1.1 05/17/2023   ALKPHOS 46 05/17/2023   AST 40 05/17/2023   ALT 22 05/17/2023   ANIONGAP 10 05/17/2023    CBG (last 3)  Recent Labs    05/17/23 0116  GLUCAP 160*      Coagulation Profile: Recent Labs  Lab 05/17/23 0129  INR 1.1     Radiology Studies: CT PELVIS WO CONTRAST  Result Date: 05/17/2023 CLINICAL DATA:  Fall 3 days ago with worsening pain. EXAM: CT PELVIS WITHOUT CONTRAST TECHNIQUE: Multidetector CT imaging of the pelvis was performed following the standard protocol without intravenous contrast. RADIATION DOSE REDUCTION: This exam was performed according to the departmental dose-optimization program which includes automated exposure control, adjustment of the mA and/or kV according to patient size and/or use of iterative reconstruction technique. COMPARISON:  PET CT 03/02/2023 FINDINGS: Urinary Tract:   Unremarkable Bowel: Partially covered fat stranding surrounds the colon which is likely thick walled. Colonic fluid is seen at the level of the transverse colon. Multiple descending colonic diverticula. No obstructive changes. Vascular/Lymphatic: Atheromatous calcification of the aorta and iliacs. Reproductive:  Unremarkable for age. Other:  No ascites or pneumoperitoneum. Musculoskeletal: No acute fracture. No hip or pelvic ring dislocation or diastasis. No significant hip degeneration. Subjective osteopenia. IMPRESSION: 1. No acute finding in the  pelvis and hips. 2. Descending colitis or diverticulitis Electronically Signed   By: Tiburcio Pea M.D.   On: 05/17/2023 04:16   CT Lumbar Spine Wo Contrast  Result Date: 05/17/2023 CLINICAL DATA:  Fall 3 days ago with worsening pain and leg numbness. EXAM: CT LUMBAR SPINE WITHOUT CONTRAST TECHNIQUE: Multidetector CT imaging of the lumbar spine was performed without intravenous contrast administration. Multiplanar CT image reconstructions were also generated. RADIATION DOSE REDUCTION: This exam was performed according to the departmental dose-optimization program which includes automated exposure control, adjustment of the mA and/or kV according to patient size and/or use of iterative reconstruction technique. COMPARISON:  12/12/2007 FINDINGS: Segmentation: 5 lumbar type vertebrae. Alignment: No traumatic malalignment Vertebrae: Subjective generalized osteopenia. Remote L1 compression fracture with mild retropulsion, healed. No evidence of aggressive bone lesion Paraspinal and other soft tissues: Partial coverage of the descending colon shows apparent submucosal low-density thickening and adjacent fat stranding. Disc levels: Disc space narrowing and endplate ridging at T12-L1 and L1-2. Degenerative facet spurring especially at L4-5 and below. Moderate bilateral foraminal narrowing at L2-3 due to disc height loss and bulging primarily. Mild thecal sac stenosis at  the same level. Mild to moderate thecal sac stenosis at L1 due to the longstanding retropulsion. IMPRESSION: 1. Partially covered thickening of the descending colon with inflammatory features suggesting colitis or diverticulitis, correlate with abdominal symptoms. 2. No acute finding in the lumbar spine. 3. Remote L1 compression fracture with healed retropulsion causing mild to moderate thecal sac stenosis. Electronically Signed   By: Tiburcio Pea M.D.   On: 05/17/2023 04:14   CT Head Wo Contrast  Result Date: 05/17/2023 CLINICAL DATA:  Head trauma fell 3 days ago with recent worsening pain and leg numbness. EXAM: CT HEAD WITHOUT CONTRAST TECHNIQUE: Contiguous axial images were obtained from the base of the skull through the vertex without intravenous contrast. RADIATION DOSE REDUCTION: This exam was performed according to the departmental dose-optimization program which includes automated exposure control, adjustment of the mA and/or kV according to patient size and/or use of iterative reconstruction technique. COMPARISON:  CT head 05/14/2023 FINDINGS: Brain: No intracranial hemorrhage, mass effect, or evidence of acute infarct. No hydrocephalus. No extra-axial fluid collection. Vascular: No hyperdense vessel or unexpected calcification. Skull: No fracture or focal lesion. Sinuses/Orbits: No acute finding. Other: None. IMPRESSION: No acute intracranial abnormality. Electronically Signed   By: Minerva Fester M.D.   On: 05/17/2023 03:48   DG Chest Portable 1 View  Result Date: 05/17/2023 CLINICAL DATA:  Weakness with nausea, vomiting and diarrhea. EXAM: PORTABLE CHEST 1 VIEW COMPARISON:  July 29, 2020 FINDINGS: The heart size and mediastinal contours are within normal limits. There is no evidence of an acute infiltrate, pleural effusion or pneumothorax. Multilevel degenerative changes are seen throughout the thoracic spine. IMPRESSION: No active cardiopulmonary disease. Electronically Signed   By:  Aram Candela M.D.   On: 05/17/2023 02:31       Kathlen Mody M.D. [facility] 05/18/2023, 2:36 PM  Available via Epic secure chat 7am-7pm After 7 pm, please refer to night coverage provider listed on amion.

## 2023-05-19 DIAGNOSIS — N189 Chronic kidney disease, unspecified: Secondary | ICD-10-CM | POA: Diagnosis not present

## 2023-05-19 DIAGNOSIS — A0472 Enterocolitis due to Clostridium difficile, not specified as recurrent: Secondary | ICD-10-CM | POA: Diagnosis not present

## 2023-05-19 DIAGNOSIS — I10 Essential (primary) hypertension: Secondary | ICD-10-CM | POA: Diagnosis not present

## 2023-05-19 DIAGNOSIS — N179 Acute kidney failure, unspecified: Secondary | ICD-10-CM | POA: Diagnosis not present

## 2023-05-19 DIAGNOSIS — E876 Hypokalemia: Secondary | ICD-10-CM | POA: Diagnosis not present

## 2023-05-19 LAB — FERRITIN: Ferritin: 84 ng/mL (ref 11–307)

## 2023-05-19 LAB — CBC WITH DIFFERENTIAL/PLATELET
Abs Immature Granulocytes: 0.07 10*3/uL (ref 0.00–0.07)
Basophils Absolute: 0 10*3/uL (ref 0.0–0.1)
Basophils Relative: 0 %
Eosinophils Absolute: 0.2 10*3/uL (ref 0.0–0.5)
Eosinophils Relative: 2 %
HCT: 30.1 % — ABNORMAL LOW (ref 36.0–46.0)
Hemoglobin: 9.5 g/dL — ABNORMAL LOW (ref 12.0–15.0)
Immature Granulocytes: 1 %
Lymphocytes Relative: 13 %
Lymphs Abs: 1.6 10*3/uL (ref 0.7–4.0)
MCH: 28.8 pg (ref 26.0–34.0)
MCHC: 31.6 g/dL (ref 30.0–36.0)
MCV: 91.2 fL (ref 80.0–100.0)
Monocytes Absolute: 1.1 10*3/uL — ABNORMAL HIGH (ref 0.1–1.0)
Monocytes Relative: 9 %
Neutro Abs: 9.3 10*3/uL — ABNORMAL HIGH (ref 1.7–7.7)
Neutrophils Relative %: 75 %
Platelets: 175 10*3/uL (ref 150–400)
RBC: 3.3 MIL/uL — ABNORMAL LOW (ref 3.87–5.11)
RDW: 14.6 % (ref 11.5–15.5)
WBC: 12.3 10*3/uL — ABNORMAL HIGH (ref 4.0–10.5)
nRBC: 0 % (ref 0.0–0.2)

## 2023-05-19 LAB — FOLATE: Folate: 21 ng/mL (ref >5.9)

## 2023-05-19 LAB — IRON AND TIBC
Iron: 28 ug/dL (ref 28–170)
Saturation Ratios: 11 % (ref 10.4–31.8)
TIBC: 246 ug/dL — ABNORMAL LOW (ref 250–450)
UIBC: 218 ug/dL

## 2023-05-19 LAB — BASIC METABOLIC PANEL
Anion gap: 7 (ref 5–15)
BUN: 17 mg/dL (ref 8–23)
CO2: 22 mmol/L (ref 22–32)
Calcium: 7.5 mg/dL — ABNORMAL LOW (ref 8.9–10.3)
Chloride: 109 mmol/L (ref 98–111)
Creatinine, Ser: 0.8 mg/dL (ref 0.44–1.00)
GFR, Estimated: >60 mL/min (ref >60)
Glucose, Bld: 99 mg/dL (ref 70–99)
Potassium: 3.7 mmol/L (ref 3.5–5.1)
Sodium: 138 mmol/L (ref 135–145)

## 2023-05-19 LAB — RETICULOCYTES
Immature Retic Fract: 28.9 % — ABNORMAL HIGH (ref 2.3–15.9)
RBC.: 3.31 MIL/uL — ABNORMAL LOW (ref 3.87–5.11)
Retic Count, Absolute: 84.1 10*3/uL (ref 19.0–186.0)
Retic Ct Pct: 2.5 % (ref 0.4–3.1)

## 2023-05-19 LAB — VITAMIN B12: Vitamin B-12: 2640 pg/mL — ABNORMAL HIGH (ref 180–914)

## 2023-05-19 LAB — MAGNESIUM: Magnesium: 2 mg/dL (ref 1.7–2.4)

## 2023-05-19 MED ORDER — ROSUVASTATIN CALCIUM 5 MG PO TABS
5.0000 mg | ORAL_TABLET | Freq: Every day | ORAL | Status: DC
  Administered 2023-05-19 – 2023-05-22 (×4): 5 mg via ORAL
  Filled 2023-05-19 (×4): qty 1

## 2023-05-19 MED ORDER — METOPROLOL SUCCINATE ER 25 MG PO TB24
25.0000 mg | ORAL_TABLET | Freq: Every day | ORAL | Status: DC
  Administered 2023-05-19 – 2023-05-22 (×4): 25 mg via ORAL
  Filled 2023-05-19 (×4): qty 1

## 2023-05-19 MED ORDER — MONTELUKAST SODIUM 10 MG PO TABS
10.0000 mg | ORAL_TABLET | Freq: Every day | ORAL | Status: DC
  Administered 2023-05-19 – 2023-05-22 (×4): 10 mg via ORAL
  Filled 2023-05-19 (×4): qty 1

## 2023-05-19 MED ORDER — LIDOCAINE 5 % EX PTCH
1.0000 | MEDICATED_PATCH | CUTANEOUS | Status: DC
  Administered 2023-05-19 – 2023-05-21 (×3): 1 via TRANSDERMAL
  Filled 2023-05-19 (×3): qty 1

## 2023-05-19 MED ORDER — ESCITALOPRAM OXALATE 10 MG PO TABS
10.0000 mg | ORAL_TABLET | Freq: Every day | ORAL | Status: DC
  Administered 2023-05-19 – 2023-05-22 (×4): 10 mg via ORAL
  Filled 2023-05-19 (×4): qty 1

## 2023-05-19 MED ORDER — ALBUTEROL SULFATE (2.5 MG/3ML) 0.083% IN NEBU: 2.5000 mg | INHALATION_SOLUTION | Freq: Four times a day (QID) | RESPIRATORY_TRACT | Status: DC | PRN

## 2023-05-19 NOTE — Evaluation (Signed)
Physical Therapy Evaluation Patient Details Name: Kimberly Beck MRN: [id number] DOB: 06-26-1946 Today's Date: 05/19/2023  History of Present Illness  Pt is 77 yo female who presents on 05/17/23 with N/V/D and found to have cdiff and AKI. PMH: L1 compression fracture, back pain, hypertension, hyperlipidemia, recent fall with right proximal humerus fracture 05/14/23  Clinical Impression  Pt admitted with above diagnosis. Pt from home with significant other who is older than her and has Parkinson's dz and she reports he cannot physically assist her. She reports before falling that she was getting HHPT due to progressive LE weakness with difficulty getting up from chair and toilet. No WB status ordered but kept RUE NWB for R proximal humerus fx. Pt sling too large, when holding RUE appropriately strap is too long to velcro. Pt required min A to come to L side of bed. Needed mod A and 2 attempts to come to standing. Pt took small steps and then became dizzy and nauseous and had to return to sitting. Patient will benefit from continued inpatient follow up therapy, <3 hours/day.  Pt currently with functional limitations due to the deficits listed below (see PT Problem List). Pt will benefit from acute skilled PT to increase their independence and safety with mobility to allow discharge.           If plan is discharge home, recommend the following: A lot of help with walking and/or transfers;A lot of help with bathing/dressing/bathroom;Assist for transportation;Assistance with cooking/housework;Help with stairs or ramp for entrance   Can travel by private vehicle   Yes    Equipment Recommendations Other (comment) (TBD)  Recommendations for Other Services  OT consult    Functional Status Assessment Patient has had a recent decline in their functional status and demonstrates the ability to make significant improvements in function in a reasonable and predictable amount of time.     Precautions  / Restrictions Precautions Precautions: Fall Precaution Comments: pt recently fell and fractures R humerus Required Braces or Orthoses: Sling Restrictions Weight Bearing Restrictions: Yes RUE Weight Bearing: Non weight bearing Other Position/Activity Restrictions: no orders in chart      Mobility  Bed Mobility Overal bed mobility: Needs Assistance Bed Mobility: Supine to Sit     Supine to sit: Mod assist, HOB elevated, Used rails     General bed mobility comments: pt able to initiate supine to sit to L with use of rail, mod A to scoot hips to EOB, increased time needed    Transfers Overall transfer level: Needs assistance Equipment used: 1 person hand held assist Transfers: Sit to/from Stand, Bed to chair/wheelchair/BSC Sit to Stand: Mod assist   Step pivot transfers: Min assist       General transfer comment: mod A for power up with increaed effort to push through LE's. Once up pt able to take pivot steps to recliner with min A for support on L but pt then became dizzy and nauseous    Ambulation/Gait               General Gait Details: unable to progress due to nausea and dizziness  Stairs            Wheelchair Mobility     Tilt Bed    Modified Rankin (Stroke Patients Only)       Balance Overall balance assessment: Needs assistance, History of Falls Sitting-balance support: No upper extremity supported, Feet supported Sitting balance-Leahy Scale: Fair     Standing balance support: Single extremity  supported, During functional activity Standing balance-Leahy Scale: Poor Standing balance comment: heavy reliance on LUE support                             Pertinent Vitals/Pain Pain Assessment Pain Assessment: Faces Faces Pain Scale: Hurts even more Pain Location: R shoulder and low back Pain Descriptors / Indicators: Aching, Sore Pain Intervention(s): Monitored during session, Limited activity within patient's tolerance     Home Living Family/patient expects to be discharged to:: Private residence Living Arrangements: Spouse/significant other Available Help at Discharge: Family;Available PRN/intermittently Type of Home: House Home Access: Level entry           Additional Comments: pt reports that significant other is older than her and has Parkinsons and cannot assist her physically    Prior Function Prior Level of Function : Needs assist;History of Falls (last six months)             Mobility Comments: pt reports she has been getting HHPT because she has been getting progressively weaker to the point that she can't get up from her chair and the toilet. This was before she fell, has not been mobile since she fell ADLs Comments: significant other helps as able, ADL's have been getting harder     Extremity/Trunk Assessment   Upper Extremity Assessment Upper Extremity Assessment: Defer to OT evaluation;RUE deficits/detail;Left hand dominant RUE Deficits / Details: sling is too large for pt, when RUE supported strap cannot velcro to itself    Lower Extremity Assessment Lower Extremity Assessment: Generalized weakness    Cervical / Trunk Assessment Cervical / Trunk Assessment: Kyphotic  Communication   Communication Communication: No apparent difficulties Cueing Techniques: Verbal cues  Cognition Arousal: Alert Behavior During Therapy: WFL for tasks assessed/performed Overall Cognitive Status: Within Functional Limits for tasks assessed                                          General Comments      Exercises     Assessment/Plan    PT Assessment Patient needs continued PT services  PT Problem List Decreased strength;Decreased activity tolerance;Decreased balance;Decreased mobility;Decreased coordination;Decreased knowledge of use of DME;Decreased knowledge of precautions;Pain       PT Treatment Interventions DME instruction;Gait training;Functional mobility  training;Therapeutic activities;Therapeutic exercise;Balance training;Neuromuscular re-education;Patient/family education    PT Goals (Current goals can be found in the Care Plan section)  Acute Rehab PT Goals Patient Stated Goal: get stronger PT Goal Formulation: With patient Time For Goal Achievement: 06/02/23 Potential to Achieve Goals: Good    Frequency Min 1X/week     Co-evaluation               AM-PAC PT "6 Clicks" Mobility  Outcome Measure Help needed turning from your back to your side while in a flat bed without using bedrails?: A Little Help needed moving from lying on your back to sitting on the side of a flat bed without using bedrails?: A Little Help needed moving to and from a bed to a chair (including a wheelchair)?: A Lot Help needed standing up from a chair using your arms (e.g., wheelchair or bedside chair)?: A Lot Help needed to walk in hospital room?: Total Help needed climbing 3-5 steps with a railing? : Total 6 Click Score: 12    End of Session Equipment Utilized During  Treatment: Other (comment) (sling) Activity Tolerance: Treatment limited secondary to medical complications (Comment) (dizziness and nausea) Patient left: in chair;with call bell/phone within reach Nurse Communication: Mobility status PT Visit Diagnosis: Unsteadiness on feet (R26.81);History of falling (Z91.81);Muscle weakness (generalized) (M62.81);Pain;Dizziness and giddiness (R42);Difficulty in walking, not elsewhere classified (R26.2) Pain - Right/Left: Right Pain - part of body: Shoulder    Time: 8295-6213 PT Time Calculation (min) (ACUTE ONLY): 33 min   Charges:   PT Evaluation $PT Eval Moderate Complexity: 1 Mod PT Treatments $Therapeutic Activity: 8-22 mins PT General Charges $$ ACUTE PT VISIT: 1 Visit         Lyanne Co, PT  Acute Rehab Services Secure chat preferred Office [phone]   Benetta Spar L Lashell Moffitt 05/19/2023, 1:28 PM

## 2023-05-19 NOTE — Progress Notes (Signed)
[facility]                                                                               Kimberly Beck, is a 76 y.o. female, DOB - Dec 04, 1945, ZOX:[id number] Admit date - 05/17/2023    Outpatient Primary MD for the patient is Jarrett Soho, PA-C  LOS - 2  days    Brief summary   77 year old lady with prior history of L1 compression fracture, back pain, hypertension, hyperlipidemia, recent fall with right humerus fracture presents today with nausea vomiting and diarrhea. She was found to be C. difficile positive and admitted for C. difficile colitis. She was also found to be severely hypokalemic and dehydrated and in AKI  Assessment & Plan    Assessment and Plan:   C. difficile colitis Started her on oral vancomycin and IV fluids. No BM this morning. But she reports feeling weak and not ready to go home.  Symptomatic management with antiemetics and pain control.    AKI Baseline creatinine appears to be between 0.9-1.2 she was admitted with a creatinine of 1.5. Started the patient on IV fluids and repeat renal parameters show improvement to 1.4 to 0.8.     Severe hypokalemia Replaced    Hypomagnesemia Replaced.    Hypertension Blood pressure parameters are optimal.   Right humerus fracture - in a sling, outpatient follow up with orthopedics recommended.  - NWB of the right humerus fracture until seen by orthopedics.  Pain control.  - therapy evaluations ordered and pending.    Back pain/remote L1 compression fracture  CT lumbar spine without contrast showed Remote L1 compression fracture with healed retropulsion causing mild to moderate thecal sac stenosis. Pain control and therapy evaluations ordered.    Anemia of acute ill ness Hemoglobin at baseline around 11, dropped to 9.5 today. No evidence of obvious bleeding.  Check anemia panel.  Stool for occult blood is negative.    Estimated body mass index is 24.86 kg/m as  calculated from the following:   Height as of this encounter: 5\' 6"  (1.676 m).   Weight as of this encounter: 69.9 kg.  Code Status: full code.  DVT Prophylaxis:  heparin injection 5,000 Units Start: 05/17/23 0600   Level of Care: Level of care: Med-Surg Family Communication: none at bedside.   Disposition Plan:     Remains inpatient appropriate:  possibly discharge in am.   Procedures:  None.   Consultants:   None.   Antimicrobials:   Anti-infectives (From admission, onward)    Start     Dose/Rate Route Frequency Ordered Stop   05/17/23 1000  vancomycin (VANCOCIN) capsule 125 mg  Status:  Discontinued        125 mg Oral 4 times daily 05/17/23 0307 05/17/23 0314   05/17/23 0315  vancomycin (VANCOCIN) capsule 125 mg        125 mg Oral 4 times daily 05/17/23 0307 05/26/23 2159        Medications  Scheduled Meds:  famotidine  20 mg Oral Daily   heparin  5,000 Units Subcutaneous Q8H   vancomycin  125 mg Oral QID   Continuous Infusions:  promethazine (PHENERGAN) injection (IM  or IVPB) 12.5 mg (05/19/23 1539)   PRN Meds:.acetaminophen **OR** acetaminophen, HYDROmorphone (DILAUDID) injection, loperamide, oxyCODONE, promethazine (PHENERGAN) injection (IM or IVPB)    Subjective:   Kimberly Beck was seen and examined today.  No BM this morning.  Does not want to go home.   Objective:   Vitals:   05/18/23 1936 05/18/23 2116 05/19/23 0557 05/19/23 1539  BP: (!) 144/83 121/68 128/60   Pulse: (!) 116 96 79   Resp: 18 18 18 17   Temp: 98.9 F (37.2 C) 99.1 F (37.3 C) (!) 97.5 F (36.4 C)   TempSrc: Oral Oral Oral Oral  SpO2: 93% 93% 97%   Weight:      Height:        Intake/Output Summary (Last 24 hours) at 05/19/2023 1745 Last data filed at 05/19/2023 1230 Gross per 24 hour  Intake 480 ml  Output --  Net 480 ml   Filed Weights   05/17/23 0103  Weight: 69.9 kg     Exam General exam: Appears calm and comfortable  Respiratory system: Clear to  auscultation. Respiratory effort normal. Cardiovascular system: S1 & S2 heard, RRR. No JVD, murmurs, rubs, gallops or clicks. No pedal edema. Gastrointestinal system: Abdomen is nondistended, soft and nontender.  Central nervous system: Alert and oriented. No focal neurological deficits. Extremities: right arm in sling.  Skin: No rashes, lesions or ulcers Psychiatry: Mood & affect appropriate.      Data Reviewed:  I have personally reviewed following labs and imaging studies   CBC Lab Results  Component Value Date   WBC 12.3 (H) 05/19/2023   RBC 3.30 (L) 05/19/2023   HGB 9.5 (L) 05/19/2023   HCT 30.1 (L) 05/19/2023   MCV 91.2 05/19/2023   MCH 28.8 05/19/2023   PLT 175 05/19/2023   MCHC 31.6 05/19/2023   RDW 14.6 05/19/2023   LYMPHSABS 1.6 05/19/2023   MONOABS 1.1 (H) 05/19/2023   EOSABS 0.2 05/19/2023   BASOSABS 0.0 05/19/2023     Last metabolic panel Lab Results  Component Value Date   NA 138 05/19/2023   K 3.7 05/19/2023   CL 109 05/19/2023   CO2 22 05/19/2023   BUN 17 05/19/2023   CREATININE 0.80 05/19/2023   GLUCOSE 99 05/19/2023   GFRNONAA >60 05/19/2023   CALCIUM 7.5 (L) 05/19/2023   PROT 5.6 (L) 05/17/2023   ALBUMIN 2.8 (L) 05/17/2023   BILITOT 1.1 05/17/2023   ALKPHOS 46 05/17/2023   AST 40 05/17/2023   ALT 22 05/17/2023   ANIONGAP 7 05/19/2023    CBG (last 3)  Recent Labs    05/17/23 0116  GLUCAP 160*      Coagulation Profile: Recent Labs  Lab 05/17/23 0129  INR 1.1     Radiology Studies: No results found.     Kathlen Mody M.D. [facility] 05/19/2023, 5:45 PM  Available via Epic secure chat 7am-7pm After 7 pm, please refer to night coverage provider listed on amion.

## 2023-05-20 DIAGNOSIS — E876 Hypokalemia: Secondary | ICD-10-CM | POA: Diagnosis not present

## 2023-05-20 DIAGNOSIS — R112 Nausea with vomiting, unspecified: Secondary | ICD-10-CM

## 2023-05-20 DIAGNOSIS — I10 Essential (primary) hypertension: Secondary | ICD-10-CM | POA: Diagnosis not present

## 2023-05-20 DIAGNOSIS — A0472 Enterocolitis due to Clostridium difficile, not specified as recurrent: Secondary | ICD-10-CM | POA: Diagnosis not present

## 2023-05-20 LAB — CBC WITH DIFFERENTIAL/PLATELET
Abs Immature Granulocytes: 0.13 10*3/uL — ABNORMAL HIGH (ref 0.00–0.07)
Basophils Absolute: 0 10*3/uL (ref 0.0–0.1)
Basophils Relative: 0 %
Eosinophils Absolute: 0.3 10*3/uL (ref 0.0–0.5)
Eosinophils Relative: 3 %
HCT: 31.2 % — ABNORMAL LOW (ref 36.0–46.0)
Hemoglobin: 9.9 g/dL — ABNORMAL LOW (ref 12.0–15.0)
Immature Granulocytes: 1 %
Lymphocytes Relative: 19 %
Lymphs Abs: 1.8 10*3/uL (ref 0.7–4.0)
MCH: 28.8 pg (ref 26.0–34.0)
MCHC: 31.7 g/dL (ref 30.0–36.0)
MCV: 90.7 fL (ref 80.0–100.0)
Monocytes Absolute: 0.8 10*3/uL (ref 0.1–1.0)
Monocytes Relative: 8 %
Neutro Abs: 6.7 10*3/uL (ref 1.7–7.7)
Neutrophils Relative %: 69 %
Platelets: 188 10*3/uL (ref 150–400)
RBC: 3.44 MIL/uL — ABNORMAL LOW (ref 3.87–5.11)
RDW: 14.4 % (ref 11.5–15.5)
WBC: 9.7 10*3/uL (ref 4.0–10.5)
nRBC: 0 % (ref 0.0–0.2)

## 2023-05-20 NOTE — Evaluation (Signed)
Occupational Therapy Evaluation Patient Details Name: Kimberly Beck MRN: [id number] DOB: 06-May-1946 Today's Date: 05/20/2023   History of Present Illness Pt is 77 yo female who presents on 05/17/23 with N/V/D and found to have cdiff and AKI. PMH: L1 compression fracture, back pain, hypertension, hyperlipidemia, recent fall with right proximal humerus fracture 05/14/23   Clinical Impression   Patient admitted for the diagnosis above.  PTA she lives at home with her SO, who is unable to assist physically.  Patient continues with mild dizziness, but no nausea this session.  Able to sit EOB with Min A and transfer to the recliner with Min A.  ADL completion is Mod to Max A from a sit to stand level.  OT will continue efforts in the acute setting, and given inadequate assist at home, Patient will benefit from continued inpatient follow up therapy, <3 hours/day        If plan is discharge home, recommend the following: A lot of help with walking and/or transfers;A lot of help with bathing/dressing/bathroom;Assist for transportation;Assistance with cooking/housework;Help with stairs or ramp for entrance    Functional Status Assessment  Patient has had a recent decline in their functional status and demonstrates the ability to make significant improvements in function in a reasonable and predictable amount of time.  Equipment Recommendations  BSC/3in1    Recommendations for Other Services       Precautions / Restrictions Precautions Precautions: Fall Required Braces or Orthoses: Sling Restrictions Weight Bearing Restrictions: Yes RUE Weight Bearing: Non weight bearing      Mobility Bed Mobility Overal bed mobility: Needs Assistance Bed Mobility: Supine to Sit     Supine to sit: HOB elevated, Contact guard          Transfers Overall transfer level: Needs assistance Equipment used: 1 person hand held assist Transfers: Sit to/from Stand, Bed to chair/wheelchair/BSC Sit to  Stand: Min assist     Step pivot transfers: Min assist            Balance Overall balance assessment: Needs assistance, History of Falls Sitting-balance support: No upper extremity supported, Feet supported Sitting balance-Leahy Scale: Fair     Standing balance support: Single extremity supported Standing balance-Leahy Scale: Poor                             ADL either performed or assessed with clinical judgement   ADL Overall ADL's : Needs assistance/impaired Eating/Feeding: Set up;Sitting   Grooming: Set up;Sitting   Upper Body Bathing: Moderate assistance;Sitting   Lower Body Bathing: Maximal assistance;Sitting/lateral leans   Upper Body Dressing : Moderate assistance;Sitting   Lower Body Dressing: Maximal assistance;Sit to/from stand   Toilet Transfer: Minimal assistance;Stand-pivot;BSC/3in1                   Vision Patient Visual Report: No change from baseline       Perception Perception: Within Functional Limits       Praxis Praxis: WFL       Pertinent Vitals/Pain Pain Assessment Pain Assessment: Faces Faces Pain Scale: Hurts little more Pain Location: R shoulder and low back Pain Descriptors / Indicators: Grimacing Pain Intervention(s): Monitored during session, Patient requesting pain meds-RN notified     Extremity/Trunk Assessment Upper Extremity Assessment Upper Extremity Assessment: Right hand dominant;RUE deficits/detail RUE Deficits / Details: non operative proximal shoulder fracture. RUE Sensation: WNL RUE Coordination: WNL   Lower Extremity Assessment Lower Extremity Assessment: Defer to  PT evaluation   Cervical / Trunk Assessment Cervical / Trunk Assessment: Kyphotic   Communication Communication Communication: No apparent difficulties   Cognition Arousal: Alert Behavior During Therapy: WFL for tasks assessed/performed Overall Cognitive Status: Within Functional Limits for tasks assessed                                        General Comments   VSS on RA    Exercises General Exercises - Upper Extremity Wrist Flexion: AROM Wrist Extension: AROM Digit Composite Flexion: AROM Composite Extension: AROM   Shoulder Instructions      Home Living Family/patient expects to be discharged to:: Private residence Living Arrangements: Spouse/significant other Available Help at Discharge: Family;Available PRN/intermittently Type of Home: House Home Access: Level entry     Home Layout: One level     Bathroom Shower/Tub: Tub/shower unit;Walk-in shower   Bathroom Toilet: Standard Bathroom Accessibility: Yes How Accessible: Accessible via walker Home Equipment: Hospital bed;Wheelchair - Biomedical scientist (2 wheels);Cane - single point          Prior Functioning/Environment               Mobility Comments: pt reports she has been getting HHPT because she has been getting progressively weaker to the point that she can't get up from her chair and the toilet. This was before she fell, has not been mobile since she fell ADLs Comments: significant other helps as able, ADL's have been getting harder        OT Problem List: Decreased strength;Decreased range of motion;Impaired balance (sitting and/or standing);Pain;Impaired UE functional use      OT Treatment/Interventions: Self-care/ADL training;Therapeutic activities;Therapeutic exercise;Patient/family education;Balance training;DME and/or AE instruction    OT Goals(Current goals can be found in the care plan section) Acute Rehab OT Goals Patient Stated Goal: Return home OT Goal Formulation: With patient Time For Goal Achievement: 06/04/23 Potential to Achieve Goals: Good ADL Goals Pt Will Perform Upper Body Bathing: with min assist;sitting Pt Will Perform Upper Body Dressing: with min assist;sitting Pt Will Perform Lower Body Dressing: with mod assist;sit to/from stand Pt Will Transfer to Toilet:  ambulating;regular height toilet;with supervision  OT Frequency: Min 1X/week    Co-evaluation              AM-PAC OT "6 Clicks" Daily Activity     Outcome Measure Help from another person eating meals?: A Little Help from another person taking care of personal grooming?: A Little Help from another person toileting, which includes using toliet, bedpan, or urinal?: A Lot Help from another person bathing (including washing, rinsing, drying)?: A Lot Help from another person to put on and taking off regular upper body clothing?: A Lot Help from another person to put on and taking off regular lower body clothing?: A Lot 6 Click Score: 14   End of Session Nurse Communication: Patient requests pain meds  Activity Tolerance: Patient tolerated treatment well Patient left: in chair;with call bell/phone within reach  OT Visit Diagnosis: Unsteadiness on feet (R26.81);Muscle weakness (generalized) (M62.81);Pain Pain - Right/Left: Right Pain - part of body: Shoulder;Arm                Time: 1330-1350 OT Time Calculation (min): 20 min Charges:  OT General Charges $OT Visit: 1 Visit OT Evaluation $OT Eval Moderate Complexity: 1 Mod  05/20/2023  RP, OTR/L  Acute Rehabilitation Services  Office:  [phone]  Devontae Casasola D Saamiya Jeppsen 05/20/2023, 1:58 PM

## 2023-05-20 NOTE — Plan of Care (Signed)

## 2023-05-20 NOTE — Progress Notes (Signed)
[facility]                                                                               Kimberly Beck, is a 77 y.o. female, DOB - 1945/10/27, ZHY:[id number] Admit date - 05/17/2023    Outpatient Primary MD for the patient is Jarrett Soho, PA-C  LOS - 3  days    Brief summary   77 year old lady with prior history of L1 compression fracture, back pain, hypertension, hyperlipidemia, recent fall with right humerus fracture presents today with nausea vomiting and diarrhea. She was found to be C. difficile positive and admitted for C. difficile colitis. She was also found to be severely hypokalemic and dehydrated and in AKI  Assessment & Plan    Assessment and Plan:   C. difficile colitis Started her on oral vancomycin and IV fluids. Soft BM last night, abd pain is improving.  Symptomatic management with antiemetics and pain control. Wbc count normalized.  Therapy evaluations ordered.    AKI Baseline creatinine appears to be between 0.9-1.2 she was admitted with a creatinine of 1.5. Started the patient on IV fluids and repeat renal parameters show improvement to 1.4 to 0.8.     Severe hypokalemia Replaced    Hypomagnesemia Replaced.    Hypertension Blood pressure parameters are well controlled.   Right humerus fracture - in a sling, outpatient follow up with orthopedics recommended.  - NWB of the right humerus fracture until seen by orthopedics.  Pain control.  - therapy evaluations ordered and pending.    Back pain/remote L1 compression fracture  CT lumbar spine without contrast showed Remote L1 compression fracture with healed retropulsion causing mild to moderate thecal sac stenosis. Pain control and therapy evaluations ordered.    Anemia of acute ill ness Hemoglobin at baseline around 11, dropped to 9.5 today. No evidence of obvious bleeding.  Anemia panel unremarkable.  Stool for occult blood is negative.    Estimated body  mass index is 24.86 kg/m as calculated from the following:   Height as of this encounter: 5\' 6"  (1.676 m).   Weight as of this encounter: 69.9 kg.  Code Status: full code.  DVT Prophylaxis:  heparin injection 5,000 Units Start: 05/17/23 0600   Level of Care: Level of care: Med-Surg Family Communication: none at bedside.   Disposition Plan:     Remains inpatient appropriate:  possibly discharge in am.   Procedures:  None.   Consultants:   None.   Antimicrobials:   Anti-infectives (From admission, onward)    Start     Dose/Rate Route Frequency Ordered Stop   05/17/23 1000  vancomycin (VANCOCIN) capsule 125 mg  Status:  Discontinued        125 mg Oral 4 times daily 05/17/23 0307 05/17/23 0314   05/17/23 0315  vancomycin (VANCOCIN) capsule 125 mg        125 mg Oral 4 times daily 05/17/23 0307 05/26/23 2159        Medications  Scheduled Meds:  escitalopram  10 mg Oral Daily   famotidine  20 mg Oral Daily   heparin  5,000 Units Subcutaneous Q8H   lidocaine  1 patch Transdermal Q24H  metoprolol succinate  25 mg Oral Daily   montelukast  10 mg Oral Daily   rosuvastatin  5 mg Oral Daily   vancomycin  125 mg Oral QID   Continuous Infusions:  promethazine (PHENERGAN) injection (IM or IVPB) 12.5 mg (05/19/23 1539)   PRN Meds:.acetaminophen **OR** acetaminophen, albuterol, HYDROmorphone (DILAUDID) injection, loperamide, oxyCODONE, promethazine (PHENERGAN) injection (IM or IVPB)    Subjective:   Kimberly Beck was seen and examined today.  One BM last night.   Objective:   Vitals:   05/19/23 1823 05/19/23 2026 05/20/23 0557 05/20/23 0814  BP: 134/66 133/63 (!) 168/91 (!) 143/66  Pulse: (!) 105 83 89 81  Resp: 17 18 18 16   Temp: 98.9 F (37.2 C) 99 F (37.2 C) 98.1 F (36.7 C) 98.4 F (36.9 C)  TempSrc: Oral Oral Oral Oral  SpO2: 98% 98% 96% 96%  Weight:      Height:       No intake or output data in the 24 hours ending 05/20/23 1545  Filed Weights    05/17/23 0103  Weight: 69.9 kg     Exam General exam: Appears calm and comfortable  Respiratory system: Clear to auscultation. Respiratory effort normal. Cardiovascular system: S1 & S2 heard, RRR. No JVD,  Gastrointestinal system: Abdomen is nondistended, soft and nontender.  Central nervous system: Alert and oriented.  Extremities: no pedal edema.  Skin: No rashes,  Psychiatry:  Mood & affect appropriate.       Data Reviewed:  I have personally reviewed following labs and imaging studies   CBC Lab Results  Component Value Date   WBC 9.7 05/20/2023   RBC 3.44 (L) 05/20/2023   HGB 9.9 (L) 05/20/2023   HCT 31.2 (L) 05/20/2023   MCV 90.7 05/20/2023   MCH 28.8 05/20/2023   PLT 188 05/20/2023   MCHC 31.7 05/20/2023   RDW 14.4 05/20/2023   LYMPHSABS 1.8 05/20/2023   MONOABS 0.8 05/20/2023   EOSABS 0.3 05/20/2023   BASOSABS 0.0 05/20/2023     Last metabolic panel Lab Results  Component Value Date   NA 138 05/19/2023   K 3.7 05/19/2023   CL 109 05/19/2023   CO2 22 05/19/2023   BUN 17 05/19/2023   CREATININE 0.80 05/19/2023   GLUCOSE 99 05/19/2023   GFRNONAA >60 05/19/2023   CALCIUM 7.5 (L) 05/19/2023   PROT 5.6 (L) 05/17/2023   ALBUMIN 2.8 (L) 05/17/2023   BILITOT 1.1 05/17/2023   ALKPHOS 46 05/17/2023   AST 40 05/17/2023   ALT 22 05/17/2023   ANIONGAP 7 05/19/2023    CBG (last 3)  No results for input(s): "GLUCAP" in the last 72 hours.     Coagulation Profile: Recent Labs  Lab 05/17/23 0129  INR 1.1     Radiology Studies: No results found.     Kathlen Mody M.D. [facility] 05/20/2023, 3:45 PM  Available via Epic secure chat 7am-7pm After 7 pm, please refer to night coverage provider listed on amion.

## 2023-05-21 DIAGNOSIS — A0472 Enterocolitis due to Clostridium difficile, not specified as recurrent: Secondary | ICD-10-CM | POA: Diagnosis not present

## 2023-05-21 DIAGNOSIS — I10 Essential (primary) hypertension: Secondary | ICD-10-CM | POA: Diagnosis not present

## 2023-05-21 DIAGNOSIS — R112 Nausea with vomiting, unspecified: Secondary | ICD-10-CM | POA: Diagnosis not present

## 2023-05-21 DIAGNOSIS — E876 Hypokalemia: Secondary | ICD-10-CM | POA: Diagnosis not present

## 2023-05-21 NOTE — [place] FL2 (Signed)
[place] MEDICAID FL2 LEVEL OF CARE FORM     IDENTIFICATION  Patient Name: Kimberly Beck Birthdate: Oct 07, 1945 Sex: female Admission Date (Current Location): 05/17/2023  [facility] and [place] Number:  Producer, television/film/video and Address:  [facility], [address] [place], [place] [address]      Provider Number: [id number]  Attending Physician Name and Address:  Kathlen Mody, MD  Relative Name and Phone Number:  Othella Boyer; Boyfriend; [phone]    Current Level of Care: Hospital Recommended Level of Care: Skilled Nursing Facility Prior Approval Number:    Date Approved/Denied:   PASRR Number: [id number]  Discharge Plan: SNF    Current Diagnoses: Patient Active Problem List   Diagnosis Date Noted   Clostridioides difficile diarrhea 05/17/2023   Acute kidney injury superimposed on chronic kidney disease (HCC) 05/17/2023   Hypokalemia 05/17/2023   C. 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   Lung nodule 04/23/2020    Orientation RESPIRATION BLADDER Height & Weight     Self, Time, Situation, Place  Normal Continent Weight: 154 lb (69.9 kg) Height:  5\' 6"  (167.6 cm)  BEHAVIORAL SYMPTOMS/MOOD NEUROLOGICAL BOWEL NUTRITION STATUS      Continent Diet (Please see dc summary)  AMBULATORY STATUS COMMUNICATION OF NEEDS Skin   Extensive Assist Verbally Other (Comment), Normal (Wound (L1 compression fracture and right humerous fracture))                       Personal Care Assistance Level of Assistance  Bathing, Feeding, Dressing Bathing Assistance: Maximum assistance Feeding assistance: Maximum assistance Dressing Assistance: Maximum assistance     Functional Limitations Info   Sight Sight Info: Impaired (R and L; reading glasses)        SPECIAL CARE FACTORS FREQUENCY  PT (By licensed PT), OT (By licensed OT)     PT Frequency: 5x OT Frequency: 5x            Contractures Contractures Info: Not present    Additional Factors Info  Code Status, Allergies Code Status Info: Full Code Allergies Info: Codeine; NSAIDs; Penicillins; Other- Not Specified (Other reaction(s): diarrhea and headache; Other reaction(s): Global anesthesia; Other reaction(s): edema)           Current Medications (05/21/2023):  This is the current hospital active medication list Current Facility-Administered Medications  Medication Dose Route Frequency Provider Last Rate Last Admin   acetaminophen (TYLENOL) tablet 650 mg  650 mg Oral Q6H PRN Crosley, Debby, MD       Or   acetaminophen (TYLENOL) suppository 650 mg  650 mg Rectal Q6H PRN Crosley, Debby, MD       albuterol (PROVENTIL) (2.5 MG/3ML) 0.083% nebulizer solution 2.5 mg  2.5 mg Nebulization Q6H PRN Kathlen Mody, MD       escitalopram (LEXAPRO) tablet 10 mg  10 mg Oral Daily Kathlen Mody, MD   10 mg at 05/21/23 0918   famotidine (PEPCID) tablet 20 mg  20 mg Oral Daily Ilda Basset, RPH   20 mg at 05/21/23 0918   heparin injection 5,000 Units  5,000 Units Subcutaneous Q8H Crosley, Debby, MD   5,000 Units at 05/21/23 2725   HYDROmorphone (DILAUDID) injection 0.5 mg  0.5 mg Intravenous Q4H PRN  Gery Pray, MD   0.5 mg at 05/19/23 0620   lidocaine (LIDODERM) 5 % 1 patch  1 patch Transdermal Q24H Kathlen Mody, MD   1 patch at 05/20/23 2124   loperamide (IMODIUM) capsule 2 mg  2 mg Oral PRN Gery Pray, MD       metoprolol succinate (TOPROL-XL) 24 hr tablet 25 mg  25 mg Oral Daily Kathlen Mody, MD   25 mg at 05/21/23 0917   montelukast (SINGULAIR) tablet 10 mg  10 mg Oral Daily Kathlen Mody, MD   10 mg at 05/21/23 4696   oxyCODONE (Oxy IR/ROXICODONE) immediate release tablet 5 mg  5 mg Oral Q4H PRN Gery Pray,  MD   5 mg at 05/21/23 0918   promethazine (PHENERGAN) 12.5 mg in sodium chloride 0.9 % 50 mL IVPB  12.5 mg Intravenous Q6H PRN Mesner, Jason, MD 150 mL/hr at 05/19/23 1539 12.5 mg at 05/19/23 1539   rosuvastatin (CRESTOR) tablet 5 mg  5 mg Oral Daily Kathlen Mody, MD   5 mg at 05/21/23 0917   vancomycin (VANCOCIN) capsule 125 mg  125 mg Oral QID Mesner, Barbara Cower, MD   125 mg at 05/21/23 2952     Discharge Medications: Please see discharge summary for a list of discharge medications.  Relevant Imaging Results:  Relevant Lab Results:   Additional Information SS# [id number]  Marliss Coots, LCSW

## 2023-05-21 NOTE — Progress Notes (Signed)
   05/21/23 1023  Mobility  Activity Ambulated with assistance in room;Transferred to/from [facility]  Level of Assistance Contact guard assist, steadying assist  Assistive Device None;Other (Comment) (Bedrails)  Distance Ambulated (ft) 10 ft  RUE Weight Bearing NWB  Activity Response Tolerated fair  Mobility Referral Yes  $Mobility charge 1 Mobility  Mobility Specialist Start Time (ACUTE ONLY) 1001  Mobility Specialist Stop Time (ACUTE ONLY) 1023  Mobility Specialist Time Calculation (min) (ACUTE ONLY) 22 min   Mobility Specialist: Progress Note  Pt agreeable to mobility session - received in bed. Required CG using bedrails. C/o generalized weakness. Pt with yellow void and liquid/soft stool, completed pericare with assitance. Returned to bed with all needs met - call bell within reach.    Barnie Mort, BS Mobility Specialist Please contact via SecureChat or Rehab office at [phone].

## 2023-05-21 NOTE — TOC Initial Note (Addendum)
Transition of Care [facility]) - Initial/Assessment Note    Patient Details  Name: Kimberly Beck MRN: [id number] Date of Birth: [birth year]  Transition of [facility]) CM/SW Contact:    Marliss Coots, LCSW Phone Number: 05/21/2023, 11:55 AM  Clinical Narrative:                  This CSW introduced herself and role to patient at bedside. CSW inquired about discharge plan (SNF and transportation). Patient stated that she prefers [facility] but if not available to stay in [facility]. Patient informed this CSW that she will need transportation. Patient confirmed contact information for this social worker, as well, and stated that she lives with her boyfriend, Othella Boyer, as [address] [place], [place] [address] in [place].   4:00PM This CSW provided patient with SNF options with their Medicare ratings at bedside. CSW informed patient that [facility] [place] was at capacity but is waiting for a decision from [employer]. Patient chose [facility] from their current options [facility], [address], [address], [employer], [place], [place], [place]), but would prefer [facility] if they were able to offer a bed.  Expected Discharge Plan: Skilled Nursing Facility Barriers to Discharge: SNF Pending bed offer, SNF Pending transportation, SNF Pending discharge summary, SNF Pending discharge orders, Continued Medical Work up   Patient Goals and CMS Choice Patient states their goals for this hospitalization and ongoing recovery are:: SNF          Expected Discharge Plan and Services In-house Referral: Clinical Social Work   Post Acute Care Choice: Skilled Nursing Facility Living arrangements for the past 2 months: Single Family Home                                      Prior Living Arrangements/Services Living arrangements for the past 2 months: Single Family Home Lives with:: Self, Significant Other Patient language and  need for interpreter reviewed:: Yes Do you feel safe going back to the place where you live?: Yes      Need for Family Participation in Patient Care: Yes (Comment) Care giver support system in place?: Yes (comment)   Criminal Activity/Legal Involvement Pertinent to Current Situation/Hospitalization: No - Comment as needed  Activities of Daily Living   ADL Screening (condition at time of admission) Independently performs ADLs?: Yes (appropriate for developmental age) Is the patient deaf or have difficulty hearing?: No Does the patient have difficulty seeing, even when wearing glasses/contacts?: No Does the patient have difficulty concentrating, remembering, or making decisions?: No  Permission Sought/Granted Permission sought to share information with : Family Supports, Oceanographer granted to share information with : Yes, Verbal Permission Granted  Share Information with NAME: Othella Boyer  Permission granted to share info w AGENCY: SNF  Permission granted to share info w Relationship: Boyfriend  Permission granted to share info w Contact Information: [phone]  Emotional Assessment Appearance:: Appears stated age Attitude/Demeanor/Rapport: Engaged Affect (typically observed): Pleasant Orientation: : Oriented to Self, Oriented to Place, Oriented to  Time, Oriented to Situation Alcohol / Substance Use: Not Applicable Psych Involvement: No (comment)  Admission diagnosis:  Clostridioides difficile diarrhea [A04.72] C. difficile colitis [A04.72] Patient Active Problem List   Diagnosis Date Noted   Clostridioides difficile diarrhea 05/17/2023   Acute kidney injury superimposed on chronic kidney disease (HCC) 05/17/2023   Hypokalemia 05/17/2023   C. 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), [place] - [address] [place] (SE) [place] [address] Phone: [phone] Fax: [phone]  [facility] [address] [place] [address] Phone: [phone] Fax: [phone]     Social Determinants of Health (SDOH) Social History: SDOH Screenings   Food Insecurity: No Food Insecurity (05/17/2023)  Housing: Low Risk  (05/17/2023)  Transportation Needs: No Transportation Needs (05/17/2023)  Utilities: Not At Risk (05/17/2023)  Tobacco Use: Medium Risk (05/17/2023)   SDOH Interventions:     Readmission Risk Interventions     No data to display

## 2023-05-21 NOTE — Progress Notes (Signed)
[facility]                                                                               Donene Lasko, is a 77 y.o. female, DOB - 09/11/1945, ZOX:[id number] Admit date - 05/17/2023    Outpatient Primary MD for the patient is Jarrett Soho, PA-C  LOS - 4  days    Brief summary   77 year old lady with prior history of L1 compression fracture, back pain, hypertension, hyperlipidemia, recent fall with right humerus fracture presents today with nausea vomiting and diarrhea. She was found to be C. difficile positive and admitted for C. difficile colitis. She was also found to be severely hypokalemic and dehydrated and in AKI  Assessment & Plan    Assessment and Plan:   C. difficile colitis Started her on oral vancomycin and IV fluids. Soft BM last night, abd pain is improving.  Symptomatic management with antiemetics and pain control. Wbc count normalized.  Therapy evaluations ordered.  She is medically stable for discharge.    AKI Baseline creatinine appears to be between 0.9-1.2 she was admitted with a creatinine of 1.5. Started the patient on IV fluids and repeat renal parameters show improvement to 1.4 to 0.8.     Severe hypokalemia Replaced    Hypomagnesemia Replaced.    Hypertension Blood pressure parameters are optimal.  Right humerus fracture - in a sling, outpatient follow up with orthopedics recommended.  - NWB of the right humerus fracture until seen by orthopedics.  Pain control.  - therapy evaluations recommending SNF.     Back pain/remote L1 compression fracture  CT lumbar spine without contrast showed Remote L1 compression fracture with healed retropulsion causing mild to moderate thecal sac stenosis. Pain control and therapy evaluations ordered.    Anemia of acute ill ness Hemoglobin at baseline around 11, dropped to 9.5 today.  Hemoglobin is stable around 9. No evidence of obvious bleeding.  Anemia panel  unremarkable.  Stool for occult blood is negative.    Estimated body mass index is 24.86 kg/m as calculated from the following:   Height as of this encounter: 5\' 6"  (1.676 m).   Weight as of this encounter: 69.9 kg.  Code Status: full code.  DVT Prophylaxis:  heparin injection 5,000 Units Start: 05/17/23 0600   Level of Care: Level of care: Med-Surg Family Communication: none at bedside.   Disposition Plan:     Remains inpatient appropriate:  possibly discharge in am to SNF.   Procedures:  None.   Consultants:   None.   Antimicrobials:   Anti-infectives (From admission, onward)    Start     Dose/Rate Route Frequency Ordered Stop   05/17/23 1000  vancomycin (VANCOCIN) capsule 125 mg  Status:  Discontinued        125 mg Oral 4 times daily 05/17/23 0307 05/17/23 0314   05/17/23 0315  vancomycin (VANCOCIN) capsule 125 mg        125 mg Oral 4 times daily 05/17/23 0307 05/26/23 2159        Medications  Scheduled Meds:  escitalopram  10 mg Oral Daily   famotidine  20 mg Oral Daily   heparin  5,000 Units Subcutaneous Q8H   lidocaine  1 patch Transdermal Q24H   metoprolol succinate  25 mg Oral Daily   montelukast  10 mg Oral Daily   rosuvastatin  5 mg Oral Daily   vancomycin  125 mg Oral QID   Continuous Infusions:  promethazine (PHENERGAN) injection (IM or IVPB) 12.5 mg (05/19/23 1539)   PRN Meds:.acetaminophen **OR** acetaminophen, albuterol, HYDROmorphone (DILAUDID) injection, loperamide, oxyCODONE, promethazine (PHENERGAN) injection (IM or IVPB)    Subjective:   Kimberly Beck was seen and examined today.  No BM today. She appears to be back to baseline.   Objective:   Vitals:   05/20/23 2047 05/21/23 0620 05/21/23 0715 05/21/23 1542  BP: 124/76 (!) 141/74 (!) 150/82 126/60  Pulse: 71 88 75 87  Resp: 18 18 16 16   Temp: 98.5 F (36.9 C) 98.8 F (37.1 C) 98.5 F (36.9 C) 98.3 F (36.8 C)  TempSrc: Oral Oral Oral Oral  SpO2: 94% 97% 96% 96%   Weight:      Height:        Intake/Output Summary (Last 24 hours) at 05/21/2023 1749 Last data filed at 05/21/2023 1330 Gross per 24 hour  Intake 480 ml  Output 250 ml  Net 230 ml    Filed Weights   05/17/23 0103  Weight: 69.9 kg     Exam General exam: Appears calm and comfortable  Respiratory system: Clear to auscultation. Respiratory effort normal. Cardiovascular system: S1 & S2 heard, RRR. No JVD,  Gastrointestinal system: Abdomen is nondistended, soft and nontender.  Central nervous system: Alert and oriented. No focal neurological deficits. Extremities: right shoulder in sling.  Skin: No rashes,  Psychiatry: . Mood & affect appropriate.       Data Reviewed:  I have personally reviewed following labs and imaging studies   CBC Lab Results  Component Value Date   WBC 9.7 05/20/2023   RBC 3.44 (L) 05/20/2023   HGB 9.9 (L) 05/20/2023   HCT 31.2 (L) 05/20/2023   MCV 90.7 05/20/2023   MCH 28.8 05/20/2023   PLT 188 05/20/2023   MCHC 31.7 05/20/2023   RDW 14.4 05/20/2023   LYMPHSABS 1.8 05/20/2023   MONOABS 0.8 05/20/2023   EOSABS 0.3 05/20/2023   BASOSABS 0.0 05/20/2023     Last metabolic panel Lab Results  Component Value Date   NA 138 05/19/2023   K 3.7 05/19/2023   CL 109 05/19/2023   CO2 22 05/19/2023   BUN 17 05/19/2023   CREATININE 0.80 05/19/2023   GLUCOSE 99 05/19/2023   GFRNONAA >60 05/19/2023   CALCIUM 7.5 (L) 05/19/2023   PROT 5.6 (L) 05/17/2023   ALBUMIN 2.8 (L) 05/17/2023   BILITOT 1.1 05/17/2023   ALKPHOS 46 05/17/2023   AST 40 05/17/2023   ALT 22 05/17/2023   ANIONGAP 7 05/19/2023    CBG (last 3)  No results for input(s): "GLUCAP" in the last 72 hours.     Coagulation Profile: Recent Labs  Lab 05/17/23 0129  INR 1.1     Radiology Studies: No results found.     Kathlen Mody M.D. [facility] 05/21/2023, 5:49 PM  Available via Epic secure chat 7am-7pm After 7 pm, please refer to night coverage provider  listed on amion.

## 2023-05-21 NOTE — Plan of Care (Signed)

## 2023-05-22 ENCOUNTER — Other Ambulatory Visit (HOSPITAL_COMMUNITY): Payer: Self-pay

## 2023-05-22 DIAGNOSIS — I10 Essential (primary) hypertension: Secondary | ICD-10-CM | POA: Diagnosis not present

## 2023-05-22 DIAGNOSIS — N179 Acute kidney failure, unspecified: Secondary | ICD-10-CM | POA: Diagnosis not present

## 2023-05-22 DIAGNOSIS — A0472 Enterocolitis due to Clostridium difficile, not specified as recurrent: Secondary | ICD-10-CM | POA: Diagnosis not present

## 2023-05-22 DIAGNOSIS — E876 Hypokalemia: Secondary | ICD-10-CM | POA: Diagnosis not present

## 2023-05-22 MED ORDER — VANCOMYCIN HCL 125 MG PO CAPS
125.0000 mg | ORAL_CAPSULE | Freq: Four times a day (QID) | ORAL | 0 refills | Status: AC
  Filled 2023-05-22: qty 20, 5d supply, fill #0

## 2023-05-22 MED ORDER — FAMOTIDINE 20 MG PO TABS
20.0000 mg | ORAL_TABLET | Freq: Every day | ORAL | 1 refills | Status: AC
  Filled 2023-05-22: qty 30, 30d supply, fill #0

## 2023-05-22 MED ORDER — VANCOMYCIN HCL 125 MG PO CAPS
125.0000 mg | ORAL_CAPSULE | Freq: Four times a day (QID) | ORAL | 0 refills | Status: DC
  Filled 2023-05-22: qty 20, 5d supply, fill #0

## 2023-05-22 MED ORDER — TRAMADOL HCL 50 MG PO TABS
50.0000 mg | ORAL_TABLET | Freq: Two times a day (BID) | ORAL | 0 refills | Status: AC | PRN
  Filled 2023-05-22: qty 10, 5d supply, fill #0

## 2023-05-22 NOTE — Progress Notes (Signed)
Physical Therapy Treatment Patient Details Name: Kimberly Beck MRN: [id number] DOB: October 30, 1945 Today's Date: 05/22/2023   History of Present Illness Pt is 77 yo female who presents on 05/17/23 with N/V/D and found to have cdiff and AKI. PMH: L1 compression fracture, back pain, hypertension, hyperlipidemia, recent fall with right proximal humerus fracture 05/14/23    PT Comments  Pt has progressed well with mobility. She reports she is feeling much better. Mod I bed mobility. Supervision transfers, and CGA amb 25' without AD. No LOB noted. Pt reports her boyfriend and his daughter are able to provide needed level of assist at home. Recommend BSC for home. No follow up PT services indicated.    If plan is discharge home, recommend the following: A little help with walking and/or transfers;A little help with bathing/dressing/bathroom;Assist for transportation;Assistance with cooking/housework;Help with stairs or ramp for entrance   Can travel by private vehicle     Yes  Equipment Recommendations  BSC/3in1    Recommendations for Other Services       Precautions / Restrictions Precautions Precautions: Fall Precaution Comments: pt recently fell and fractures R humerus Required Braces or Orthoses: Sling Restrictions Weight Bearing Restrictions: Yes RUE Weight Bearing: Non weight bearing Other Position/Activity Restrictions: no orders in chart     Mobility  Bed Mobility Overal bed mobility: Modified Independent                  Transfers Overall transfer level: Needs assistance Equipment used: None Transfers: Sit to/from Stand, Bed to chair/wheelchair/BSC Sit to Stand: Supervision   Step pivot transfers: Supervision       General transfer comment: no physical assist    Ambulation/Gait Ambulation/Gait assistance: Contact guard assist Gait Distance (Feet): 25 Feet Assistive device: None Gait Pattern/deviations: Step-through pattern, Trunk flexed, Decreased  stride length       General Gait Details: steady gait with in room amb, no AD. Pt declining further amb distance due to wanting to eat breakfast. Her tray arrived late.   Stairs             Wheelchair Mobility     Tilt Bed    Modified Rankin (Stroke Patients Only)       Balance Overall balance assessment: Mild deficits observed, not formally tested Sitting-balance support: No upper extremity supported, Feet supported Sitting balance-Leahy Scale: Good     Standing balance support: No upper extremity supported, During functional activity Standing balance-Leahy Scale: Fair                              Cognition Arousal: Alert Behavior During Therapy: WFL for tasks assessed/performed Overall Cognitive Status: Within Functional Limits for tasks assessed                                          Exercises      General Comments        Pertinent Vitals/Pain Pain Assessment Pain Assessment: Faces Faces Pain Scale: Hurts a little bit Pain Location: RUE Pain Descriptors / Indicators: Grimacing Pain Intervention(s): Monitored during session    Home Living                          Prior Function            PT Goals (current goals can  now be found in the care plan section) Acute Rehab PT Goals Patient Stated Goal: home Progress towards PT goals: Progressing toward goals    Frequency    Min 1X/week      PT Plan      Co-evaluation              AM-PAC PT "6 Clicks" Mobility   Outcome Measure  Help needed turning from your back to your side while in a flat bed without using bedrails?: None Help needed moving from lying on your back to sitting on the side of a flat bed without using bedrails?: None Help needed moving to and from a bed to a chair (including a wheelchair)?: A Little Help needed standing up from a chair using your arms (e.g., wheelchair or bedside chair)?: A Little Help needed to walk in  hospital room?: A Little Help needed climbing 3-5 steps with a railing? : A Little 6 Click Score: 20    End of Session Equipment Utilized During Treatment: Other (comment) (sling) Activity Tolerance: Patient tolerated treatment well Patient left: in bed;with call bell/phone within reach Nurse Communication: Mobility status PT Visit Diagnosis: Unsteadiness on feet (R26.81);History of falling (Z91.81);Muscle weakness (generalized) (M62.81);Pain;Difficulty in walking, not elsewhere classified (R26.2) Pain - Right/Left: Right Pain - part of body: Shoulder     Time: 1610-9604 PT Time Calculation (min) (ACUTE ONLY): 21 min  Charges:    $Gait Training: 8-22 mins PT General Charges $$ ACUTE PT VISIT: 1 Visit                     Ferd Glassing., PT  Office # [phone]    Ilda Foil 05/22/2023, 11:15 AM

## 2023-05-22 NOTE — TOC Progression Note (Signed)
Transition of Care [facility]) - Progression Note    Patient Details  Name: Kimberly Beck MRN: [id number] Date of Birth: 08/31/1945  Transition of [facility]) CM/SW Contact  Gordy Clement, RN Phone Number: 05/22/2023, 11:04 AM  Clinical Narrative:     Bedside commode has been ordered through [employer]  Will be delivered bedside prior to DC.    Expected Discharge Plan: Skilled Nursing Facility Barriers to Discharge: SNF Pending bed offer, SNF Pending transportation, SNF Pending discharge summary, SNF Pending discharge orders, Continued Medical Work up  Expected Discharge Plan and Services In-house Referral: Clinical Social Work   Post Acute Care Choice: Skilled Nursing Facility Living arrangements for the past 2 months: Single Family Home                                       Social Determinants of Health (SDOH) Interventions SDOH Screenings   Food Insecurity: No Food Insecurity (05/17/2023)  Housing: Low Risk  (05/17/2023)  Transportation Needs: No Transportation Needs (05/17/2023)  Utilities: Not At Risk (05/17/2023)  Tobacco Use: Medium Risk (05/17/2023)    Readmission Risk Interventions     No data to display

## 2023-05-22 NOTE — TOC Progression Note (Addendum)
Transition of Care [facility]) - Progression Note    Patient Details  Name: Kimberly Beck MRN: [id number] Date of Birth: 01-26-1946  Transition of Care [facility]) CM/SW Contact  Marliss Coots, LCSW Phone Number: 05/22/2023, 9:40 AM  Clinical Narrative:     This CSW provided patient with an additional SNF placement [facility]) and informed that [facility] was unable to offer a bed at this time. Patient expressed preference in discharging home rather than [facility]. Patient informed this CSW that she has an accessible bathroom, wheelchair, walker, cane, and a ramp at home. Patient also has a hospital bed in storage unit. Patient's boyfriend's daughter may be able to provide transportation for discharge with advanced notice. Patient's boyfriend is able to provide a caregiver role during recovery. CSW encouraged patient to relay preferences to medical team and informed her that an insurance authorization for [employer] would occur in case discharging to home is not an option. Patient expressed understanding of this information.   Expected Discharge Plan: Skilled Nursing Facility Barriers to Discharge: SNF Pending bed offer, SNF Pending transportation, SNF Pending discharge summary, SNF Pending discharge orders, Continued Medical Work up  Expected Discharge Plan and Services In-house Referral: Clinical Social Work   Post Acute Care Choice: Skilled Nursing Facility Living arrangements for the past 2 months: Single Family Home                                       Social Determinants of Health (SDOH) Interventions SDOH Screenings   Food Insecurity: No Food Insecurity (05/17/2023)  Housing: Low Risk  (05/17/2023)  Transportation Needs: No Transportation Needs (05/17/2023)  Utilities: Not At Risk (05/17/2023)  Tobacco Use: Medium Risk (05/17/2023)    Readmission Risk Interventions     No data to display

## 2023-05-22 NOTE — TOC Transition Note (Signed)
Transition of Care [facility]) - CM/SW Discharge Note   Patient Details  Name: Kimberly Beck MRN: [id number] Date of Birth: Feb 26, 1946  [facility]) CM/SW Contact:  Gordy Clement, RN Phone Number: 05/22/2023, 11:36 AM   Clinical Narrative:    Patient will DC to home today with S/O. Home Health has been arranged and [facility] will provide PT/OT . [employer] will be delivering a bedside commode bedside prior to DC. Boyfriend's Daughter will be transporting home later today - around 5:00-6:00          Barriers to Discharge: SNF Pending bed offer, SNF Pending transportation, SNF Pending discharge summary, SNF Pending discharge orders, Continued Medical Work up   Patient Goals and CMS Choice      Discharge Placement                         Discharge Plan and Services Additional resources added to the After Visit Summary for   In-house Referral: Clinical Social Work   Post Acute Care Choice: Skilled Nursing Facility                               Social Determinants of Health (SDOH) Interventions SDOH Screenings   Food Insecurity: No Food Insecurity (05/17/2023)  Housing: Low Risk  (05/17/2023)  Transportation Needs: No Transportation Needs (05/17/2023)  Utilities: Not At Risk (05/17/2023)  Tobacco Use: Medium Risk (05/17/2023)     Readmission Risk Interventions     No data to display

## 2023-05-22 NOTE — TOC Progression Note (Signed)
Transition of Care [facility]) - Progression Note    Patient Details  Name: Kimberly Beck MRN: [id number] Date of Birth: 1946-05-17  Transition of [facility]) CM/SW Contact  Gordy Clement, RN Phone Number: 05/22/2023, 10:00 AM  Clinical Narrative:     Patient wants to dc to home vs SNF now  I spoke with Patient She will have assistance in the home and prefers to DC there She is trying to coordinate things for a DC today Said "its a bad day" Apparently the Boyfriend's Daughter is assisting him today with Dr appointments and such. She is going to let me know what time someone will be home for her to get inside. She mentioned needing a cab voucher I will follow      Expected Discharge Plan: Skilled Nursing Facility Barriers to Discharge: SNF Pending bed offer, SNF Pending transportation, SNF Pending discharge summary, SNF Pending discharge orders, Continued Medical Work up  Expected Discharge Plan and Services In-house Referral: Clinical Social Work   Post Acute Care Choice: Skilled Nursing Facility Living arrangements for the past 2 months: Single Family Home                                       Social Determinants of Health (SDOH) Interventions SDOH Screenings   Food Insecurity: No Food Insecurity (05/17/2023)  Housing: Low Risk  (05/17/2023)  Transportation Needs: No Transportation Needs (05/17/2023)  Utilities: Not At Risk (05/17/2023)  Tobacco Use: Medium Risk (05/17/2023)    Readmission Risk Interventions     No data to display

## 2023-05-26 NOTE — Discharge Summary (Signed)
Physician Discharge Summary   Patient: Kimberly Beck MRN: [id number] DOB: 1946-03-07  Admit date:     05/17/2023  Discharge date: 05/22/2023  Discharge Physician: Kathlen Mody   PCP: Jarrett Soho, PA-C   Recommendations at discharge:  Please follow up with PCP in one week.  Please follow u with Orthopedics in one week.   Discharge Diagnoses: Principal Problem:   Clostridioides difficile diarrhea Active Problems:   HTN (hypertension)   Hyperlipidemia   Acute kidney injury superimposed on chronic kidney disease (HCC)   Hypokalemia   C. difficile colitis  Resolved Problems:   * No resolved hospital problems. *  Hospital Course:    77 year old lady with prior history of L1 compression fracture, back pain, hypertension, hyperlipidemia, recent fall with right humerus fracture presents today with nausea vomiting and diarrhea. She was found to be C. difficile positive and admitted for C. difficile colitis. She was also found to be severely hypokalemic and dehydrated and in AKI  Assessment and Plan:  C. difficile colitis Started her on oral vancomycin and IV fluids. Soft BM last night, abd pain is improving.  Symptomatic management with antiemetics and pain control. Wbc count normalized.  Therapy evaluations ordered.  She is medically stable for discharge.      AKI Baseline creatinine appears to be between 0.9-1.2 she was admitted with a creatinine of 1.5. Started the patient on IV fluids and repeat renal parameters show improvement to 1.4 to 0.8.         Severe hypokalemia Replaced       Hypomagnesemia Replaced.      Hypertension Blood pressure parameters are optimal.   Right humerus fracture - in a sling, outpatient follow up with orthopedics recommended.  - NWB of the right humerus fracture until seen by orthopedics.  Pain control.  - therapy evaluations recommending SNF. But she wanted to go home.        Back pain/remote L1 compression  fracture  CT lumbar spine without contrast showed Remote L1 compression fracture with healed retropulsion causing mild to moderate thecal sac stenosis. Pain control and therapy evaluations ordered.      Anemia of acute ill ness Hemoglobin at baseline around 11, dropped to 9.5 today.  Hemoglobin is stable around 9. No evidence of obvious bleeding.  Anemia panel unremarkable.  Stool for occult blood is negative.      Estimated body mass index is 24.86 kg/m as calculated from the following:   Height as of this encounter: 5\' 6"  (1.676 m).   Weight as of this encounter: 69.9 kg.   Consultants: none.  Procedures performed: none.  Disposition: Home Diet recommendation:  Regular diet DISCHARGE MEDICATION: Allergies as of 05/22/2023       Reactions   Codeine Itching   Drunk feeling    Nsaids Other (See Comments)   Liver    Other    Other reaction(s): diarrhea and headache Other reaction(s): Global anesthesia Other reaction(s): edema   Penicillins Rash   Dry rash        Medication List     STOP taking these medications    pantoprazole 40 MG tablet Commonly known as: PROTONIX       TAKE these medications    albuterol 108 (90 Base) MCG/ACT inhaler Commonly known as: VENTOLIN HFA Inhale 2 puffs into the lungs every 6 (six) hours as needed for wheezing or shortness of breath.   aspirin EC 81 MG tablet Take 81 mg by mouth daily. Swallow whole.  Benadryl Allergy 25 MG tablet Generic drug: diphenhydrAMINE Take 25 mg by mouth at bedtime as needed for sleep.   calcium citrate 950 (200 Ca) MG tablet Commonly known as: CALCITRATE - dosed in mg elemental calcium Take 200 mg of elemental calcium by mouth daily.   escitalopram 10 MG tablet Commonly known as: LEXAPRO Take 10 mg by mouth daily.   famotidine 20 MG tablet Commonly known as: PEPCID Take 1 tablet (20 mg total) by mouth daily.   lidocaine 5 % Commonly known as: Lidoderm Place 1 patch onto the skin  daily. Remove & Discard patch within 12 hours or as directed by MD   lisinopril-hydrochlorothiazide 20-25 MG tablet Commonly known as: ZESTORETIC Take 1 tablet by mouth daily.   metoprolol succinate 25 MG 24 hr tablet Commonly known as: TOPROL-XL TAKE 1 TABLET BY MOUTH DAILY   montelukast 10 MG tablet Commonly known as: SINGULAIR Take 10 mg by mouth daily.   Multivitamin Women 50+ Tabs Take 1 tablet by mouth daily.   rosuvastatin 5 MG tablet Commonly known as: CRESTOR Take 1 tablet by mouth daily.   traMADol 50 MG tablet Commonly known as: ULTRAM Take 1 tablet (50 mg total) by mouth every 12 (twelve) hours as needed for up to 5 days. What changed: when to take this   TYLENOL 500 MG tablet Generic drug: acetaminophen Take 1,000 mg by mouth as needed for mild pain (pain score 1-3), moderate pain (pain score 4-6) or headache.   vancomycin 125 MG capsule Commonly known as: VANCOCIN Take 1 capsule (125 mg total) by mouth 4 (four) times daily for 5 days.   Vitamin D 50 MCG (2000 UT) Caps Take 2,000 Units by mouth daily.        Follow-up Information     Jarrett Soho, PA-C. Schedule an appointment as soon as possible for a visit in 1 week(s).   Specialty: Family Medicine Contact information: [address] [place] [address] [phone]                Discharge Exam: Ceasar Mons Weights   05/17/23 0103  Weight: 69.9 kg   General exam: Appears calm and comfortable  Respiratory system: Clear to auscultation. Respiratory effort normal. Cardiovascular system: S1 & S2 heard, RRR. No JVD, murmurs, rubs, gallops or clicks. No pedal edema. Gastrointestinal system: Abdomen is nondistended, soft and nontender. No organomegaly or masses felt. Normal bowel sounds heard. Central nervous system: Alert and oriented. No focal neurological deficits. Extremities: Symmetric 5 x 5 power. Skin: No rashes, lesions or ulcers Psychiatry: Judgement and insight appear  normal. Mood & affect appropriate.    Condition at discharge: fair  The results of significant diagnostics from this hospitalization (including imaging, microbiology, ancillary and laboratory) are listed below for reference.   Imaging Studies: CT PELVIS WO CONTRAST  Result Date: 05/17/2023 CLINICAL DATA:  Fall 3 days ago with worsening pain. EXAM: CT PELVIS WITHOUT CONTRAST TECHNIQUE: Multidetector CT imaging of the pelvis was performed following the standard protocol without intravenous contrast. RADIATION DOSE REDUCTION: This exam was performed according to the departmental dose-optimization program which includes automated exposure control, adjustment of the mA and/or kV according to patient size and/or use of iterative reconstruction technique. COMPARISON:  PET CT 03/02/2023 FINDINGS: Urinary Tract:  Unremarkable Bowel: Partially covered fat stranding surrounds the colon which is likely thick walled. Colonic fluid is seen at the level of the transverse colon. Multiple descending colonic diverticula. No obstructive changes. Vascular/Lymphatic: Atheromatous calcification of the aorta  and iliacs. Reproductive:  Unremarkable for age. Other:  No ascites or pneumoperitoneum. Musculoskeletal: No acute fracture. No hip or pelvic ring dislocation or diastasis. No significant hip degeneration. Subjective osteopenia. IMPRESSION: 1. No acute finding in the pelvis and hips. 2. Descending colitis or diverticulitis Electronically Signed   By: Tiburcio Pea M.D.   On: 05/17/2023 04:16   CT Lumbar Spine Wo Contrast  Result Date: 05/17/2023 CLINICAL DATA:  Fall 3 days ago with worsening pain and leg numbness. EXAM: CT LUMBAR SPINE WITHOUT CONTRAST TECHNIQUE: Multidetector CT imaging of the lumbar spine was performed without intravenous contrast administration. Multiplanar CT image reconstructions were also generated. RADIATION DOSE REDUCTION: This exam was performed according to the departmental  dose-optimization program which includes automated exposure control, adjustment of the mA and/or kV according to patient size and/or use of iterative reconstruction technique. COMPARISON:  12/12/2007 FINDINGS: Segmentation: 5 lumbar type vertebrae. Alignment: No traumatic malalignment Vertebrae: Subjective generalized osteopenia. Remote L1 compression fracture with mild retropulsion, healed. No evidence of aggressive bone lesion Paraspinal and other soft tissues: Partial coverage of the descending colon shows apparent submucosal low-density thickening and adjacent fat stranding. Disc levels: Disc space narrowing and endplate ridging at T12-L1 and L1-2. Degenerative facet spurring especially at L4-5 and below. Moderate bilateral foraminal narrowing at L2-3 due to disc height loss and bulging primarily. Mild thecal sac stenosis at the same level. Mild to moderate thecal sac stenosis at L1 due to the longstanding retropulsion. IMPRESSION: 1. Partially covered thickening of the descending colon with inflammatory features suggesting colitis or diverticulitis, correlate with abdominal symptoms. 2. No acute finding in the lumbar spine. 3. Remote L1 compression fracture with healed retropulsion causing mild to moderate thecal sac stenosis. Electronically Signed   By: Tiburcio Pea M.D.   On: 05/17/2023 04:14   CT Head Wo Contrast  Result Date: 05/17/2023 CLINICAL DATA:  Head trauma fell 3 days ago with recent worsening pain and leg numbness. EXAM: CT HEAD WITHOUT CONTRAST TECHNIQUE: Contiguous axial images were obtained from the base of the skull through the vertex without intravenous contrast. RADIATION DOSE REDUCTION: This exam was performed according to the departmental dose-optimization program which includes automated exposure control, adjustment of the mA and/or kV according to patient size and/or use of iterative reconstruction technique. COMPARISON:  CT head 05/14/2023 FINDINGS: Brain: No intracranial  hemorrhage, mass effect, or evidence of acute infarct. No hydrocephalus. No extra-axial fluid collection. Vascular: No hyperdense vessel or unexpected calcification. Skull: No fracture or focal lesion. Sinuses/Orbits: No acute finding. Other: None. IMPRESSION: No acute intracranial abnormality. Electronically Signed   By: Minerva Fester M.D.   On: 05/17/2023 03:48   DG Chest Portable 1 View  Result Date: 05/17/2023 CLINICAL DATA:  Weakness with nausea, vomiting and diarrhea. EXAM: PORTABLE CHEST 1 VIEW COMPARISON:  July 29, 2020 FINDINGS: The heart size and mediastinal contours are within normal limits. There is no evidence of an acute infiltrate, pleural effusion or pneumothorax. Multilevel degenerative changes are seen throughout the thoracic spine. IMPRESSION: No active cardiopulmonary disease. Electronically Signed   By: Aram Candela M.D.   On: 05/17/2023 02:31   DG Knee Complete 4 Views Right  Result Date: 05/14/2023 CLINICAL DATA:  Status post fall. EXAM: RIGHT KNEE - COMPLETE 4+ VIEW COMPARISON:  None Available. FINDINGS: No evidence of fracture, dislocation, or joint effusion. No evidence of arthropathy or other focal bone abnormality. An ill-defined linear soft tissue deformity is seen along the anteromedial aspect of the right knee. IMPRESSION: No  acute osseous abnormality. Electronically Signed   By: Aram Candela M.D.   On: 05/14/2023 23:40   DG Humerus Right  Result Date: 05/14/2023 CLINICAL DATA:  Status post fall. EXAM: RIGHT HUMERUS - 2+ VIEW COMPARISON:  None Available. FINDINGS: An acute fracture deformity is seen involving the head and neck of the proximal right humerus. There is no evidence of dislocation. Soft tissues are unremarkable. IMPRESSION: Acute fracture of the proximal right humerus. Electronically Signed   By: Aram Candela M.D.   On: 05/14/2023 23:37   DG Shoulder Right  Result Date: 05/14/2023 CLINICAL DATA:  Status post fall. EXAM: RIGHT SHOULDER  - 2+ VIEW COMPARISON:  None Available. FINDINGS: An acute fracture deformity is seen extending through the head and neck of the proximal right humerus. There is no evidence of dislocation. There is no evidence of arthropathy or other focal bone abnormality. Soft tissues are unremarkable. IMPRESSION: Acute fracture of the proximal right humerus. Electronically Signed   By: Aram Candela M.D.   On: 05/14/2023 23:36   CT Cervical Spine Wo Contrast  Result Date: 05/14/2023 CLINICAL DATA:  Status post trauma. EXAM: CT CERVICAL SPINE WITHOUT CONTRAST TECHNIQUE: Multidetector CT imaging of the cervical spine was performed without intravenous contrast. Multiplanar CT image reconstructions were also generated. RADIATION DOSE REDUCTION: This exam was performed according to the departmental dose-optimization program which includes automated exposure control, adjustment of the mA and/or kV according to patient size and/or use of iterative reconstruction technique. COMPARISON:  None Available. FINDINGS: Alignment: Normal. Skull base and vertebrae: No acute fracture. No primary bone lesion or focal pathologic process. Chronic and degenerative changes are seen involving the body and tip of the dens, as well as the adjacent portion of the anterior arch of C1. Soft tissues and spinal canal: No prevertebral fluid or swelling. No visible canal hematoma. Disc levels: Mild anterior osteophyte formation is seen at the levels of C3-C4, C4-C5, C5-C6 and C6-C7. Mild to moderate severity posterior bony spurring is seen at the level of C5-C6. Marked severity posterior intervertebral disc space narrowing is seen at the levels of C5-C6 and C6-C7. Bilateral moderate to marked severity multilevel facet joint hypertrophy is noted. Upper chest: Mild to moderate severity biapical scarring and/or atelectasis is seen. Other: Ill-defined thyroid nodules are seen within the right and left lobes of the thyroid gland. IMPRESSION: 1. No acute  cervical spine fracture or subluxation. 2. Marked severity degenerative changes at the levels of C5-C6 and C6-C7. 3. Ill-defined thyroid nodules. Correlation with nonemergent thyroid gland ultrasound is recommended. Electronically Signed   By: Aram Candela M.D.   On: 05/14/2023 23:36   CT Head Wo Contrast  Result Date: 05/14/2023 CLINICAL DATA:  Status post trauma. EXAM: CT HEAD WITHOUT CONTRAST TECHNIQUE: Contiguous axial images were obtained from the base of the skull through the vertex without intravenous contrast. RADIATION DOSE REDUCTION: This exam was performed according to the departmental dose-optimization program which includes automated exposure control, adjustment of the mA and/or kV according to patient size and/or use of iterative reconstruction technique. COMPARISON:  April 12, 2004 FINDINGS: Brain: There is mild cerebral atrophy with widening of the extra-axial spaces and ventricular dilatation. There are areas of decreased attenuation within the white matter tracts of the supratentorial brain, consistent with microvascular disease changes. 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 Vascular: Marked severity bilateral cavernous carotid artery calcification is seen. Skull: Normal. Negative for fracture or focal lesion. Sinuses/Orbits: No acute finding. Other: None. IMPRESSION:  1. Generalized cerebral atrophy with chronic white matter small vessel ischemic changes. 2. 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 3. No acute intracranial abnormality. Electronically Signed   By: Aram Candela M.D.   On: 05/14/2023 23:32    Microbiology: Results for orders placed or performed during the hospital encounter of 05/17/23  C Difficile Quick Screen w PCR reflex     Status: Abnormal   Collection Time: 05/17/23  1:48 AM   Specimen: STOOL  Result Value Ref Range Status   C Diff antigen POSITIVE (A) NEGATIVE Final   C Diff toxin NEGATIVE NEGATIVE Final   C Diff  interpretation Results are indeterminate. See PCR results.  Final    Comment: Performed at [facility], [address], [place], [place] [address]  C. Diff by PCR, Reflexed     Status: Abnormal   Collection Time: 05/17/23  1:48 AM  Result Value Ref Range Status   Toxigenic C. Difficile by PCR POSITIVE (A) NEGATIVE Final    Comment: Positive for toxigenic C. difficile with little to no toxin production. Only treat if clinical presentation suggests symptomatic illness. Performed at [facility], [address], [place], [place] [address]     Labs: CBC: Recent Labs  Lab 05/20/23 0548  WBC 9.7  NEUTROABS 6.7  HGB 9.9*  HCT 31.2*  MCV 90.7  PLT 188   Basic Metabolic Panel: No results for input(s): "NA", "K", "CL", "CO2", "GLUCOSE", "BUN", "CREATININE", "CALCIUM", "MG", "PHOS" in the last 168 hours. Liver Function Tests: No results for input(s): "AST", "ALT", "ALKPHOS", "BILITOT", "PROT", "ALBUMIN" in the last 168 hours. CBG: No results for input(s): "GLUCAP" in the last 168 hours.  Discharge time spent: 37 minutes.  Signed: Kathlen Mody, MD [facility]

## 2023-05-30 DIAGNOSIS — M25511 Pain in right shoulder: Secondary | ICD-10-CM | POA: Diagnosis not present

## 2023-05-30 DIAGNOSIS — S42201A Unspecified fracture of upper end of right humerus, initial encounter for closed fracture: Secondary | ICD-10-CM | POA: Diagnosis not present

## 2023-06-02 ENCOUNTER — Emergency Department (HOSPITAL_COMMUNITY): Payer: Medicare Other

## 2023-06-02 ENCOUNTER — Inpatient Hospital Stay (HOSPITAL_COMMUNITY)
Admission: EM | Admit: 2023-06-02 | Discharge: 2023-06-05 | DRG: 373 | Disposition: A | Discharge status: Home Health | Payer: Medicare Other | Attending: Internal Medicine | Admitting: Internal Medicine

## 2023-06-02 ENCOUNTER — Encounter (HOSPITAL_COMMUNITY): Payer: Self-pay | Admitting: Emergency Medicine

## 2023-06-02 ENCOUNTER — Other Ambulatory Visit: Payer: Self-pay

## 2023-06-02 DIAGNOSIS — R531 Weakness: Secondary | ICD-10-CM | POA: Diagnosis not present

## 2023-06-02 DIAGNOSIS — K449 Diaphragmatic hernia without obstruction or gangrene: Secondary | ICD-10-CM | POA: Diagnosis not present

## 2023-06-02 DIAGNOSIS — Z885 Allergy status to narcotic agent status: Secondary | ICD-10-CM

## 2023-06-02 DIAGNOSIS — R112 Nausea with vomiting, unspecified: Secondary | ICD-10-CM

## 2023-06-02 DIAGNOSIS — K5730 Diverticulosis of large intestine without perforation or abscess without bleeding: Secondary | ICD-10-CM | POA: Diagnosis not present

## 2023-06-02 DIAGNOSIS — I499 Cardiac arrhythmia, unspecified: Secondary | ICD-10-CM | POA: Diagnosis not present

## 2023-06-02 DIAGNOSIS — D75839 Thrombocytosis, unspecified: Secondary | ICD-10-CM | POA: Diagnosis not present

## 2023-06-02 DIAGNOSIS — Z743 Need for continuous supervision: Secondary | ICD-10-CM | POA: Diagnosis not present

## 2023-06-02 DIAGNOSIS — Z79899 Other long term (current) drug therapy: Secondary | ICD-10-CM | POA: Diagnosis not present

## 2023-06-02 DIAGNOSIS — E876 Hypokalemia: Secondary | ICD-10-CM | POA: Diagnosis present

## 2023-06-02 DIAGNOSIS — Z886 Allergy status to analgesic agent status: Secondary | ICD-10-CM | POA: Diagnosis not present

## 2023-06-02 DIAGNOSIS — R0689 Other abnormalities of breathing: Secondary | ICD-10-CM | POA: Diagnosis not present

## 2023-06-02 DIAGNOSIS — A0472 Enterocolitis due to Clostridium difficile, not specified as recurrent: Secondary | ICD-10-CM | POA: Diagnosis present

## 2023-06-02 DIAGNOSIS — E860 Dehydration: Secondary | ICD-10-CM | POA: Diagnosis not present

## 2023-06-02 DIAGNOSIS — Z85118 Personal history of other malignant neoplasm of bronchus and lung: Secondary | ICD-10-CM | POA: Diagnosis not present

## 2023-06-02 DIAGNOSIS — Z8249 Family history of ischemic heart disease and other diseases of the circulatory system: Secondary | ICD-10-CM | POA: Diagnosis not present

## 2023-06-02 DIAGNOSIS — F32A Depression, unspecified: Secondary | ICD-10-CM | POA: Diagnosis present

## 2023-06-02 DIAGNOSIS — I10 Essential (primary) hypertension: Secondary | ICD-10-CM | POA: Diagnosis not present

## 2023-06-02 DIAGNOSIS — Z86718 Personal history of other venous thrombosis and embolism: Secondary | ICD-10-CM

## 2023-06-02 DIAGNOSIS — R197 Diarrhea, unspecified: Secondary | ICD-10-CM | POA: Diagnosis not present

## 2023-06-02 DIAGNOSIS — Z888 Allergy status to other drugs, medicaments and biological substances status: Secondary | ICD-10-CM | POA: Diagnosis not present

## 2023-06-02 DIAGNOSIS — Z880 Allergy status to penicillin: Secondary | ICD-10-CM

## 2023-06-02 DIAGNOSIS — R6889 Other general symptoms and signs: Secondary | ICD-10-CM | POA: Diagnosis not present

## 2023-06-02 DIAGNOSIS — I4510 Unspecified right bundle-branch block: Secondary | ICD-10-CM | POA: Diagnosis present

## 2023-06-02 DIAGNOSIS — K529 Noninfective gastroenteritis and colitis, unspecified: Secondary | ICD-10-CM | POA: Diagnosis not present

## 2023-06-02 DIAGNOSIS — Z87891 Personal history of nicotine dependence: Secondary | ICD-10-CM | POA: Diagnosis not present

## 2023-06-02 DIAGNOSIS — E782 Mixed hyperlipidemia: Secondary | ICD-10-CM | POA: Diagnosis not present

## 2023-06-02 DIAGNOSIS — Z823 Family history of stroke: Secondary | ICD-10-CM

## 2023-06-02 DIAGNOSIS — R110 Nausea: Secondary | ICD-10-CM | POA: Diagnosis not present

## 2023-06-02 DIAGNOSIS — R7303 Prediabetes: Secondary | ICD-10-CM | POA: Diagnosis not present

## 2023-06-02 DIAGNOSIS — Z7982 Long term (current) use of aspirin: Secondary | ICD-10-CM

## 2023-06-02 DIAGNOSIS — A0471 Enterocolitis due to Clostridium difficile, recurrent: Principal | ICD-10-CM | POA: Diagnosis present

## 2023-06-02 DIAGNOSIS — R109 Unspecified abdominal pain: Secondary | ICD-10-CM | POA: Diagnosis not present

## 2023-06-02 DIAGNOSIS — E785 Hyperlipidemia, unspecified: Secondary | ICD-10-CM | POA: Diagnosis not present

## 2023-06-02 LAB — CBC WITH DIFFERENTIAL/PLATELET
Abs Immature Granulocytes: 0.04 10*3/uL (ref 0.00–0.07)
Basophils Absolute: 0.1 10*3/uL (ref 0.0–0.1)
Basophils Relative: 1 %
Eosinophils Absolute: 0.1 10*3/uL (ref 0.0–0.5)
Eosinophils Relative: 1 %
HCT: 42.2 % (ref 36.0–46.0)
Hemoglobin: 13.5 g/dL (ref 12.0–15.0)
Immature Granulocytes: 0 %
Lymphocytes Relative: 20 %
Lymphs Abs: 1.8 10*3/uL (ref 0.7–4.0)
MCH: 28.8 pg (ref 26.0–34.0)
MCHC: 32 g/dL (ref 30.0–36.0)
MCV: 90.2 fL (ref 80.0–100.0)
Monocytes Absolute: 0.8 10*3/uL (ref 0.1–1.0)
Monocytes Relative: 9 %
Neutro Abs: 6.1 10*3/uL (ref 1.7–7.7)
Neutrophils Relative %: 69 %
Platelets: 432 10*3/uL — ABNORMAL HIGH (ref 150–400)
RBC: 4.68 MIL/uL (ref 3.87–5.11)
RDW: 15.3 % (ref 11.5–15.5)
WBC: 9 10*3/uL (ref 4.0–10.5)
nRBC: 0 % (ref 0.0–0.2)

## 2023-06-02 LAB — COMPREHENSIVE METABOLIC PANEL
ALT: 14 U/L (ref 0–44)
AST: 27 U/L (ref 15–41)
Albumin: 3.5 g/dL (ref 3.5–5.0)
Alkaline Phosphatase: 119 U/L (ref 38–126)
Anion gap: 15 (ref 5–15)
BUN: 12 mg/dL (ref 8–23)
CO2: 21 mmol/L — ABNORMAL LOW (ref 22–32)
Calcium: 9.9 mg/dL (ref 8.9–10.3)
Chloride: 104 mmol/L (ref 98–111)
Creatinine, Ser: 0.96 mg/dL (ref 0.44–1.00)
GFR, Estimated: >60 mL/min (ref >60)
Glucose, Bld: 142 mg/dL — ABNORMAL HIGH (ref 70–99)
Potassium: 3.1 mmol/L — ABNORMAL LOW (ref 3.5–5.1)
Sodium: 140 mmol/L (ref 135–145)
Total Bilirubin: 1.4 mg/dL — ABNORMAL HIGH (ref <1.2)
Total Protein: 6.5 g/dL (ref 6.5–8.1)

## 2023-06-02 LAB — LACTIC ACID, PLASMA
Lactic Acid, Venous: 1.8 mmol/L (ref 0.5–1.9)
Lactic Acid, Venous: 1.6 mmol/L (ref 0.5–1.9)

## 2023-06-02 LAB — LIPASE, BLOOD: Lipase: 34 U/L (ref 11–51)

## 2023-06-02 MED ORDER — MECLIZINE HCL 25 MG PO TABS
25.0000 mg | ORAL_TABLET | Freq: Once | ORAL | Status: AC
  Administered 2023-06-02: 25 mg via ORAL
  Filled 2023-06-02: qty 1

## 2023-06-02 MED ORDER — ONDANSETRON HCL 4 MG/2ML IJ SOLN
4.0000 mg | Freq: Once | INTRAMUSCULAR | Status: AC
  Administered 2023-06-02: 4 mg via INTRAVENOUS
  Filled 2023-06-02: qty 2

## 2023-06-02 MED ORDER — IOHEXOL 350 MG/ML SOLN
65.0000 mL | Freq: Once | INTRAVENOUS | Status: AC | PRN
  Administered 2023-06-02: 65 mL via INTRAVENOUS

## 2023-06-02 MED ORDER — LORAZEPAM 2 MG/ML IJ SOLN
0.5000 mg | Freq: Once | INTRAMUSCULAR | Status: AC
  Administered 2023-06-02: 0.5 mg via INTRAVENOUS
  Filled 2023-06-02: qty 1

## 2023-06-02 MED ORDER — SODIUM CHLORIDE 0.9 % IV BOLUS
1000.0000 mL | Freq: Once | INTRAVENOUS | Status: AC
  Administered 2023-06-02: 1000 mL via INTRAVENOUS

## 2023-06-02 MED ORDER — POTASSIUM CHLORIDE 10 MEQ/100ML IV SOLN
10.0000 meq | INTRAVENOUS | Status: AC
  Administered 2023-06-02 – 2023-06-03 (×3): 10 meq via INTRAVENOUS
  Filled 2023-06-02 (×3): qty 100

## 2023-06-02 NOTE — ED Provider Notes (Addendum)
[facility] DEPARTMENT AT [facility] Provider Note   CSN: [id number] Arrival date & time: 06/02/23  1848     History  Chief Complaint  Patient presents with   Emesis   Nausea   Diarrhea    Kimberly Beck is a 77 y.o. female.  77 year old female with past medical history of recent C. difficile infection as well as hypertension as well as fall with right humerus fracture presenting to the emergency department today with nausea, vomiting, and diarrhea.  The patient states that this has been going now for the past 3 to 4 days.  She reports this was worse today and she has not been able to keep anything down.  She reports that her emesis is dark.  She reports that she has been having loose, watery stools multiple times today.  She reports that she was recently admitted for C. difficile.  She states that these were the exact symptoms she was having when she was having C. difficile.  She was sent home on some antibiotics but completed these a few days ago.  Her symptoms started shortly after.  She denies any fevers.  Denies any significant abdominal pain.   Emesis Associated symptoms: diarrhea   Diarrhea Associated symptoms: vomiting        Home Medications Prior to Admission medications   Medication Sig Start Date End Date Taking? Authorizing Provider  acetaminophen (TYLENOL) 500 MG tablet Take 1,000 mg by mouth as needed for mild pain (pain score 1-3), moderate pain (pain score 4-6) or headache.    [provider]  albuterol (VENTOLIN HFA) 108 (90 Base) MCG/ACT inhaler Inhale 2 puffs into the lungs every 6 (six) hours as needed for wheezing or shortness of breath. 05/20/20   Josephine Igo, DO  aspirin EC 81 MG tablet Take 81 mg by mouth daily. Swallow whole.    [provider]  calcium citrate (CALCITRATE - DOSED IN MG ELEMENTAL CALCIUM) 950 (200 Ca) MG tablet Take 200 mg of elemental calcium by mouth daily.    [provider]   Cholecalciferol (VITAMIN D) 50 MCG (2000 UT) CAPS Take 2,000 Units by mouth daily.    [provider]  diphenhydrAMINE (BENADRYL ALLERGY) 25 MG tablet Take 25 mg by mouth at bedtime as needed for sleep.    [provider]  escitalopram (LEXAPRO) 10 MG tablet Take 10 mg by mouth daily.  04/02/19   [provider]  famotidine (PEPCID) 20 MG tablet Take 1 tablet (20 mg total) by mouth daily. 05/23/23   Kathlen Mody, MD  lidocaine (LIDODERM) 5 % Place 1 patch onto the skin daily. Remove & Discard patch within 12 hours or as directed by MD 05/15/23   Nicanor Alcon, April, MD  lisinopril-hydrochlorothiazide (ZESTORETIC) 20-25 MG tablet Take 1 tablet by mouth daily.     [provider]  metoprolol succinate (TOPROL-XL) 25 MG 24 hr tablet TAKE 1 TABLET BY MOUTH DAILY 11/28/22   Runell Gess, MD  montelukast (SINGULAIR) 10 MG tablet Take 10 mg by mouth daily. 01/07/20   [provider]  Multiple Vitamins-Minerals (MULTIVITAMIN WOMEN 50+) TABS Take 1 tablet by mouth daily.    [provider]  rosuvastatin (CRESTOR) 5 MG tablet Take 1 tablet by mouth daily.    [provider]      Allergies    Codeine, Nsaids, Other, and Penicillins    Review of Systems   Review of Systems  Gastrointestinal:  Positive for diarrhea,  nausea and vomiting.  All other systems reviewed and are negative.   Physical Exam Updated Vital Signs BP (!) 180/92   Pulse 97   Temp 98 F (36.7 C) (Oral)   Resp 18   Ht 5\' 6"  (1.676 m)   Wt 69 kg   SpO2 100%   BMI 24.55 kg/m  Physical Exam Vitals and nursing note reviewed.   Gen: Appears uncomfortable, pale appearing Eyes: PERRL, EOMI HEENT: Dry mucous membranes Neck: trachea midline Resp: clear to auscultation bilaterally Card: Tachycardic, no murmurs, rubs, or gallops Abd: nontender, nondistended Extremities: no calf tenderness, no edema Vascular: 2+ radial pulses bilaterally, 2+ DP pulses bilaterally Skin:  no rashes Psyc: acting appropriately   ED Results / Procedures / Treatments   Labs (all labs ordered are listed, but only abnormal results are displayed) Labs Reviewed  CBC WITH DIFFERENTIAL/PLATELET - Abnormal; Notable for the following components:      Result Value   Platelets 432 (*)    All other components within normal limits  COMPREHENSIVE METABOLIC PANEL - Abnormal; Notable for the following components:   Potassium 3.1 (*)    CO2 21 (*)    Glucose, Bld 142 (*)    Total Bilirubin 1.4 (*)    All other components within normal limits  LIPASE, BLOOD  LACTIC ACID, PLASMA  URINALYSIS, ROUTINE W REFLEX MICROSCOPIC  LACTIC ACID, PLASMA    EKG None  Radiology No results found.  Procedures Procedures    Medications Ordered in ED Medications  sodium chloride 0.9 % bolus 1,000 mL (1,000 mLs Intravenous New Bag/Given 06/02/23 1950)  ondansetron (ZOFRAN) injection 4 mg (4 mg Intravenous Given 06/02/23 1950)  sodium chloride 0.9 % bolus 1,000 mL (1,000 mLs Intravenous New Bag/Given 06/02/23 2039)  meclizine (ANTIVERT) tablet 25 mg (25 mg Oral Given 06/02/23 2105)  ondansetron (ZOFRAN) injection 4 mg (4 mg Intravenous Given 06/02/23 2104)    ED Course/ Medical Decision Making/ A&P Clinical Course as of 06/02/23 2109  Sat Jun 02, 2023  2107 Stable HO from ART Recent c.dif and finshed ABX.   [CC]    Clinical Course User Index [CC] Glyn Ade, MD                                 Medical Decision Making 77 year old female with past medical history of hypertension and recent C. difficile infection presenting to the emergency department today with symptoms consistent with likely recurrence of C. difficile infection.  I will further evaluate the patient here with basic labs including LFTs and a lipase to evaluate for hepatobiliary pathology or pancreatitis.  Will is obtain a lactic acid here as patient is tachycardic here on arrival.  The patient's abdominal exam is very  reassuring.  She is denying any abdominal pain.  She does not have any significant tenderness here on exam.  I will give the patient IV fluids here.  Will give her Zofran for her nausea.  Will also repeat a C. difficile test Pillard we will reevaluate for ultimate disposition.  The patient continues to have nausea here.  She is ordered more Zofran.  I did reassess the patient and she is having some right periumbilical abdominal pain at this time.  CT scan is ordered.  She also reports some dizziness that she complains of more as lightheadedness but states she does have some disequilibrium as well.  This has been going on since she fell and  hit her head initially.  She did have a CT scan at that time that was performed that is unremarkable.  The patient is given meclizine here.  Her labs are largely unremarkable.  CT scan is pending at the time of signout.  I suspect she will require admission but I did explain the patient that if her symptoms resolved with the medications and fluids here that she may potentially be discharged.  The patient's EKG interpreted by me shows sinus rhythm with a rate of 93 with normal axis, normal intervals, and atypical right bundle branch block with nonspecific ST-T changes.  Amount and/or Complexity of Data Reviewed Labs: ordered. Radiology: ordered.  Risk Prescription drug management.           Final Clinical Impression(s) / ED Diagnoses Final diagnoses:  Dehydration  Nausea vomiting and diarrhea    Rx / DC Orders ED Discharge Orders     None         Durwin Glaze, MD 06/02/23 2055    Durwin Glaze, MD 06/02/23 2109

## 2023-06-02 NOTE — ED Triage Notes (Signed)
Pt to ER via EMS from home with c/o vomiting that started last night and diarrhea that has been ongoing for several weeks.  Pt has been treated for C-diff.

## 2023-06-02 NOTE — ED Notes (Signed)
Patient became anxious before going to CT. Provider notified.

## 2023-06-03 DIAGNOSIS — Z8249 Family history of ischemic heart disease and other diseases of the circulatory system: Secondary | ICD-10-CM | POA: Diagnosis not present

## 2023-06-03 DIAGNOSIS — Z87891 Personal history of nicotine dependence: Secondary | ICD-10-CM | POA: Diagnosis not present

## 2023-06-03 DIAGNOSIS — Z886 Allergy status to analgesic agent status: Secondary | ICD-10-CM | POA: Diagnosis not present

## 2023-06-03 DIAGNOSIS — Z7982 Long term (current) use of aspirin: Secondary | ICD-10-CM | POA: Diagnosis not present

## 2023-06-03 DIAGNOSIS — R112 Nausea with vomiting, unspecified: Secondary | ICD-10-CM

## 2023-06-03 DIAGNOSIS — Z888 Allergy status to other drugs, medicaments and biological substances status: Secondary | ICD-10-CM | POA: Diagnosis not present

## 2023-06-03 DIAGNOSIS — I10 Essential (primary) hypertension: Secondary | ICD-10-CM

## 2023-06-03 DIAGNOSIS — Z880 Allergy status to penicillin: Secondary | ICD-10-CM | POA: Diagnosis not present

## 2023-06-03 DIAGNOSIS — E782 Mixed hyperlipidemia: Secondary | ICD-10-CM | POA: Diagnosis not present

## 2023-06-03 DIAGNOSIS — Z823 Family history of stroke: Secondary | ICD-10-CM | POA: Diagnosis not present

## 2023-06-03 DIAGNOSIS — A0472 Enterocolitis due to Clostridium difficile, not specified as recurrent: Secondary | ICD-10-CM | POA: Diagnosis not present

## 2023-06-03 DIAGNOSIS — Z79899 Other long term (current) drug therapy: Secondary | ICD-10-CM | POA: Diagnosis not present

## 2023-06-03 DIAGNOSIS — E785 Hyperlipidemia, unspecified: Secondary | ICD-10-CM | POA: Diagnosis present

## 2023-06-03 DIAGNOSIS — E860 Dehydration: Secondary | ICD-10-CM

## 2023-06-03 DIAGNOSIS — R7303 Prediabetes: Secondary | ICD-10-CM | POA: Diagnosis present

## 2023-06-03 DIAGNOSIS — R531 Weakness: Secondary | ICD-10-CM | POA: Diagnosis not present

## 2023-06-03 DIAGNOSIS — R197 Diarrhea, unspecified: Secondary | ICD-10-CM | POA: Diagnosis not present

## 2023-06-03 DIAGNOSIS — Z885 Allergy status to narcotic agent status: Secondary | ICD-10-CM | POA: Diagnosis not present

## 2023-06-03 DIAGNOSIS — D75839 Thrombocytosis, unspecified: Secondary | ICD-10-CM | POA: Diagnosis present

## 2023-06-03 DIAGNOSIS — Z85118 Personal history of other malignant neoplasm of bronchus and lung: Secondary | ICD-10-CM | POA: Diagnosis not present

## 2023-06-03 DIAGNOSIS — Z86718 Personal history of other venous thrombosis and embolism: Secondary | ICD-10-CM | POA: Diagnosis not present

## 2023-06-03 DIAGNOSIS — A0471 Enterocolitis due to Clostridium difficile, recurrent: Secondary | ICD-10-CM | POA: Diagnosis present

## 2023-06-03 DIAGNOSIS — I4510 Unspecified right bundle-branch block: Secondary | ICD-10-CM | POA: Diagnosis present

## 2023-06-03 DIAGNOSIS — F32A Depression, unspecified: Secondary | ICD-10-CM | POA: Diagnosis present

## 2023-06-03 DIAGNOSIS — E876 Hypokalemia: Secondary | ICD-10-CM | POA: Diagnosis present

## 2023-06-03 LAB — CBC WITH DIFFERENTIAL/PLATELET
Abs Immature Granulocytes: 0.03 10*3/uL (ref 0.00–0.07)
Basophils Absolute: 0.1 10*3/uL (ref 0.0–0.1)
Basophils Relative: 1 %
Eosinophils Absolute: 0.1 10*3/uL (ref 0.0–0.5)
Eosinophils Relative: 2 %
HCT: 34.9 % — ABNORMAL LOW (ref 36.0–46.0)
Hemoglobin: 11.1 g/dL — ABNORMAL LOW (ref 12.0–15.0)
Immature Granulocytes: 0 %
Lymphocytes Relative: 22 %
Lymphs Abs: 1.5 10*3/uL (ref 0.7–4.0)
MCH: 29.4 pg (ref 26.0–34.0)
MCHC: 31.8 g/dL (ref 30.0–36.0)
MCV: 92.3 fL (ref 80.0–100.0)
Monocytes Absolute: 0.8 10*3/uL (ref 0.1–1.0)
Monocytes Relative: 12 %
Neutro Abs: 4.2 10*3/uL (ref 1.7–7.7)
Neutrophils Relative %: 63 %
Platelets: 348 10*3/uL (ref 150–400)
RBC: 3.78 MIL/uL — ABNORMAL LOW (ref 3.87–5.11)
RDW: 15.4 % (ref 11.5–15.5)
WBC: 6.7 10*3/uL (ref 4.0–10.5)
nRBC: 0 % (ref 0.0–0.2)

## 2023-06-03 LAB — BASIC METABOLIC PANEL
Anion gap: 11 (ref 5–15)
BUN: 10 mg/dL (ref 8–23)
CO2: 21 mmol/L — ABNORMAL LOW (ref 22–32)
Calcium: 8.2 mg/dL — ABNORMAL LOW (ref 8.9–10.3)
Chloride: 110 mmol/L (ref 98–111)
Creatinine, Ser: 0.84 mg/dL (ref 0.44–1.00)
GFR, Estimated: >60 mL/min (ref >60)
Glucose, Bld: 98 mg/dL (ref 70–99)
Potassium: 3 mmol/L — ABNORMAL LOW (ref 3.5–5.1)
Sodium: 142 mmol/L (ref 135–145)

## 2023-06-03 LAB — URINALYSIS, ROUTINE W REFLEX MICROSCOPIC
Bacteria, UA: NONE SEEN
Bilirubin Urine: NEGATIVE
Glucose, UA: NEGATIVE mg/dL
Ketones, ur: 5 mg/dL — AB
Leukocytes,Ua: NEGATIVE
Nitrite: NEGATIVE
Protein, ur: NEGATIVE mg/dL
Specific Gravity, Urine: 1.039 — ABNORMAL HIGH (ref 1.005–1.030)
pH: 7 (ref 5.0–8.0)

## 2023-06-03 LAB — MAGNESIUM: Magnesium: 1.4 mg/dL — ABNORMAL LOW (ref 1.7–2.4)

## 2023-06-03 MED ORDER — ACETAMINOPHEN 325 MG PO TABS: 650.0000 mg | ORAL_TABLET | Freq: Four times a day (QID) | ORAL | Status: DC | PRN

## 2023-06-03 MED ORDER — HEPARIN SODIUM (PORCINE) 5000 UNIT/ML IJ SOLN
5000.0000 [IU] | Freq: Three times a day (TID) | INTRAMUSCULAR | Status: DC
  Administered 2023-06-03 – 2023-06-05 (×6): 5000 [IU] via SUBCUTANEOUS
  Filled 2023-06-03 (×6): qty 1

## 2023-06-03 MED ORDER — METOPROLOL SUCCINATE ER 25 MG PO TB24
25.0000 mg | ORAL_TABLET | Freq: Every day | ORAL | Status: DC
  Administered 2023-06-03 – 2023-06-05 (×3): 25 mg via ORAL
  Filled 2023-06-03 (×3): qty 1

## 2023-06-03 MED ORDER — VANCOMYCIN HCL 125 MG PO CAPS: 125.0000 mg | ORAL_CAPSULE | Freq: Every day | ORAL | Status: DC

## 2023-06-03 MED ORDER — LISINOPRIL 20 MG PO TABS
20.0000 mg | ORAL_TABLET | Freq: Every day | ORAL | Status: DC
  Administered 2023-06-03 – 2023-06-05 (×3): 20 mg via ORAL
  Filled 2023-06-03 (×3): qty 1

## 2023-06-03 MED ORDER — ASPIRIN 81 MG PO TBEC
81.0000 mg | DELAYED_RELEASE_TABLET | Freq: Every day | ORAL | Status: DC
  Administered 2023-06-03 – 2023-06-05 (×3): 81 mg via ORAL
  Filled 2023-06-03 (×4): qty 1

## 2023-06-03 MED ORDER — ESCITALOPRAM OXALATE 10 MG PO TABS
10.0000 mg | ORAL_TABLET | Freq: Every day | ORAL | Status: DC
  Administered 2023-06-03 – 2023-06-05 (×3): 10 mg via ORAL
  Filled 2023-06-03 (×3): qty 1

## 2023-06-03 MED ORDER — ACETAMINOPHEN 650 MG RE SUPP: 650.0000 mg | Freq: Four times a day (QID) | RECTAL | Status: DC | PRN

## 2023-06-03 MED ORDER — VANCOMYCIN HCL 125 MG PO CAPS
125.0000 mg | ORAL_CAPSULE | ORAL | Status: DC
Start: 2023-07-09 — End: 2023-06-03

## 2023-06-03 MED ORDER — FIDAXOMICIN 200 MG PO TABS
200.0000 mg | ORAL_TABLET | Freq: Two times a day (BID) | ORAL | Status: DC
  Administered 2023-06-03 – 2023-06-05 (×5): 200 mg via ORAL
  Filled 2023-06-03 (×6): qty 1

## 2023-06-03 MED ORDER — ROSUVASTATIN CALCIUM 5 MG PO TABS
5.0000 mg | ORAL_TABLET | Freq: Every day | ORAL | Status: DC
  Administered 2023-06-03 – 2023-06-05 (×3): 5 mg via ORAL
  Filled 2023-06-03 (×3): qty 1

## 2023-06-03 MED ORDER — VANCOMYCIN HCL 125 MG PO CAPS: 125.0000 mg | ORAL_CAPSULE | ORAL | Status: DC

## 2023-06-03 MED ORDER — VANCOMYCIN HCL 125 MG PO CAPS: 125.0000 mg | ORAL_CAPSULE | Freq: Two times a day (BID) | ORAL | Status: DC

## 2023-06-03 MED ORDER — VANCOMYCIN HCL 125 MG PO CAPS
125.0000 mg | ORAL_CAPSULE | Freq: Three times a day (TID) | ORAL | Status: DC
  Administered 2023-06-03: 125 mg via ORAL
  Filled 2023-06-03 (×2): qty 1

## 2023-06-03 MED ORDER — MONTELUKAST SODIUM 10 MG PO TABS
10.0000 mg | ORAL_TABLET | Freq: Every day | ORAL | Status: DC
  Administered 2023-06-03 – 2023-06-05 (×3): 10 mg via ORAL
  Filled 2023-06-03 (×3): qty 1

## 2023-06-03 MED ORDER — POTASSIUM CHLORIDE CRYS ER 20 MEQ PO TBCR
40.0000 meq | EXTENDED_RELEASE_TABLET | Freq: Two times a day (BID) | ORAL | Status: AC
  Administered 2023-06-03 (×2): 40 meq via ORAL
  Filled 2023-06-03 (×2): qty 2

## 2023-06-03 MED ORDER — MAGNESIUM OXIDE -MG SUPPLEMENT 400 (240 MG) MG PO TABS
400.0000 mg | ORAL_TABLET | Freq: Two times a day (BID) | ORAL | Status: AC
  Administered 2023-06-03 (×2): 400 mg via ORAL
  Filled 2023-06-03 (×2): qty 1

## 2023-06-03 MED ORDER — OXYCODONE HCL 5 MG PO TABS: 5.0000 mg | ORAL_TABLET | Freq: Four times a day (QID) | ORAL | Status: DC | PRN

## 2023-06-03 MED ORDER — FAMOTIDINE 20 MG PO TABS
20.0000 mg | ORAL_TABLET | Freq: Every day | ORAL | Status: DC
  Administered 2023-06-03 – 2023-06-05 (×3): 20 mg via ORAL
  Filled 2023-06-03 (×3): qty 1

## 2023-06-03 NOTE — H&P (Signed)
PCP:   Jarrett Soho, PA-C   Chief Complaint:  Diarrhea, weakness  HPI: This is a 77 year old female with past medical history of lung cancer [in remission], HTN, HLD, tachycardia.  Patient recently admitted 11/14 to 11/19 with a diagnosis of C. difficile diarrhea.  Patient started on p.o. vancomycin.  Discharged to complete a 10-day course of p.o. vancomycin.  Per patient regimen completed.  She states when regimen completed her diarrhea not resolved and her stool was just beginning to solidify.  Yesterday she developed nausea vomiting abdominal pain severe diarrhea, significant weakness and abdominal discomfort.  She came to the ER.  In the ER patient hemodynamically stable.  Afebrile.  Potassium 3.1.  CT abdomen pelvis showed slightly improved but persistent descending colitis.  Review of Systems:  Per HPI  Past Medical History: Past Medical History:  Diagnosis Date   Allergy    Anemia    Cancer (HCC)    Complication of anesthesia    Depression    DVT, lower extremity, recurrent, right (HCC)    History of hiatal hernia    HTN (hypertension)    Lung cancer (HCC)    Right   Pneumonia    PONV (postoperative nausea and vomiting)    N/V only after receiving ether.   Pre-diabetes    BS checks daily, takes no medicines   Past Surgical History:  Procedure Laterality Date   ESOPHAGOGASTRODUODENOSCOPY N/A 07/29/2020   Procedure: ESOPHAGOGASTRODUODENOSCOPY (EGD);  Surgeon: Corliss Skains, MD;  Location: [facility];  Service: Thoracic;  Laterality: N/A;   EYE SURGERY     Bilateral cataract removal   INTERCOSTAL NERVE BLOCK Right 04/29/2020   Procedure: INTERCOSTAL NERVE BLOCK;  Surgeon: Corliss Skains, MD;  Location: [facility] OR;  Service: Thoracic;  Laterality: Right;   LYMPH NODE DISSECTION Right 04/29/2020   Procedure: LYMPH NODE DISSECTION;  Surgeon: Corliss Skains, MD;  Location: [facility] OR;  Service: Thoracic;  Laterality: Right;   TONSILLECTOMY     TONSILLECTOMY AND  ADENOIDECTOMY     TUBAL LIGATION     XI ROBOTIC ASSISTED PARAESOPHAGEAL HERNIA REPAIR N/A 07/29/2020   Procedure: XI ROBOTIC ASSISTED PARAESOPHAGEAL HERNIA REPAIR;  Surgeon: Corliss Skains, MD;  Location: [facility] OR;  Service: Thoracic;  Laterality: N/A;    Medications: Prior to Admission medications   Medication Sig Start Date End Date Taking? Authorizing Provider  acetaminophen (TYLENOL) 500 MG tablet Take 1,000 mg by mouth as needed for mild pain (pain score 1-3), moderate pain (pain score 4-6) or headache.    [provider]  albuterol (VENTOLIN HFA) 108 (90 Base) MCG/ACT inhaler Inhale 2 puffs into the lungs every 6 (six) hours as needed for wheezing or shortness of breath. 05/20/20   Josephine Igo, DO  aspirin EC 81 MG tablet Take 81 mg by mouth daily. Swallow whole.    [provider]  calcium citrate (CALCITRATE - DOSED IN MG ELEMENTAL CALCIUM) 950 (200 Ca) MG tablet Take 200 mg of elemental calcium by mouth daily.    [provider]  Cholecalciferol (VITAMIN D) 50 MCG (2000 UT) CAPS Take 2,000 Units by mouth daily.    [provider]  diphenhydrAMINE (BENADRYL ALLERGY) 25 MG tablet Take 25 mg by mouth at bedtime as needed for sleep.    [provider]  escitalopram (LEXAPRO) 10 MG tablet Take 10 mg by mouth daily.  04/02/19   [provider]  famotidine (PEPCID) 20 MG tablet Take 1 tablet (20 mg total) by  mouth daily. 05/23/23   Kathlen Mody, MD  lidocaine (LIDODERM) 5 % Place 1 patch onto the skin daily. Remove & Discard patch within 12 hours or as directed by MD 05/15/23   Nicanor Alcon, April, MD  lisinopril-hydrochlorothiazide (ZESTORETIC) 20-25 MG tablet Take 1 tablet by mouth daily.     [provider]  metoprolol succinate (TOPROL-XL) 25 MG 24 hr tablet TAKE 1 TABLET BY MOUTH DAILY 11/28/22   Runell Gess, MD  montelukast (SINGULAIR) 10 MG tablet Take 10 mg by mouth daily. 01/07/20   [provider]  Multiple  Vitamins-Minerals (MULTIVITAMIN WOMEN 50+) TABS Take 1 tablet by mouth daily.    [provider]  rosuvastatin (CRESTOR) 5 MG tablet Take 1 tablet by mouth daily.    [provider]    Allergies:   Allergies  Allergen Reactions   Codeine Itching    Drunk feeling    Nsaids Other (See Comments)    Liver    Other     Other reaction(s): diarrhea and headache Other reaction(s): Global anesthesia Other reaction(s): edema   Penicillins Rash    Dry rash    Social History:  reports that she quit smoking about 37 years ago. Her smoking use included cigarettes. She started smoking about 62 years ago. She has a 75 pack-year smoking history. She has never used smokeless tobacco. She reports that she does not drink alcohol and does not use drugs.  Family History: Family History  Problem Relation Age of Onset   Heart attack Father    Stroke Maternal Grandfather     Physical Exam: Vitals:   06/02/23 2107 06/02/23 2130 06/03/23 0040 06/03/23 0100  BP: (!) 170/93 (!) 173/88 (!) 147/82 (!) 153/77  Pulse: 97 (!) 102 96 99  Resp: 14 10 15 18   Temp:  98.3 F (36.8 C)    TempSrc:  Oral    SpO2: 100% 100% 100% 100%  Weight:      Height:        General: A and O x 3, weak female.  In no acute distress Eyes: pink conjunctiva, no scleral icterus ENT: Moist oral mucosa, neck supple, no thyromegaly Lungs: CTA B/L, no wheeze, no crackles, no use of accessory muscles Cardiovascular: RRR, no regurgitation, no gallops, no murmurs. No carotid bruits, no JVD Abdomen: soft, positive BS, non-tender, mild nonspecific tenderness to palpation generalized, not an acute abdomen GU: not examined Neuro: CN II - XII grossly intact, sensation intact Musculoskeletal: strength 5/5 all extremities, no edema Skin: no rash, no subcutaneous crepitation, no decubitus Psych: appropriate patient   Labs on Admission:  Recent Labs    06/02/23 1948  NA 140  K 3.1*  CL 104  CO2 21*  GLUCOSE  142*  BUN 12  CREATININE 0.96  CALCIUM 9.9   Recent Labs    06/02/23 1948  AST 27  ALT 14  ALKPHOS 119  BILITOT 1.4*  PROT 6.5  ALBUMIN 3.5   Recent Labs    06/02/23 1948  LIPASE 34   Recent Labs    06/02/23 1948  WBC 9.0  NEUTROABS 6.1  HGB 13.5  HCT 42.2  MCV 90.2  PLT 432*     Radiological Exams on Admission: CT ABDOMEN PELVIS W CONTRAST  Result Date: 06/02/2023 CLINICAL DATA:  Abdominal pain, acute, nonlocalized L sided abd pain Nausea; Diarrhea. Abdominal pain, acute, nonlocalized, L sided abd pain. Omni 350 65mL See ED Notes: 77 year old female with past medical history of recent C. difficile  infection EXAM: CT ABDOMEN AND PELVIS WITH CONTRAST TECHNIQUE: Multidetector CT imaging of the abdomen and pelvis was performed using the standard protocol following bolus administration of intravenous contrast. RADIATION DOSE REDUCTION: This exam was performed according to the departmental dose-optimization program which includes automated exposure control, adjustment of the mA and/or kV according to patient size and/or use of iterative reconstruction technique. CONTRAST:  65mL OMNIPAQUE IOHEXOL 350 MG/ML SOLN COMPARISON:  CT lumbar spine 05/17/2023. FINDINGS: Lower chest: At least small volume hiatal hernia. Hepatobiliary: Fluid density lesions likely represent simple renal cysts. No gallstones, gallbladder wall thickening, or pericholecystic fluid. No biliary dilatation. Pancreas: No focal lesion. Normal pancreatic contour. No surrounding inflammatory changes. No main pancreatic ductal dilatation. Spleen: Normal in size without focal abnormality. Adrenals/Urinary Tract: No adrenal nodule bilaterally. Bilateral kidneys enhance symmetrically. No hydronephrosis. No hydroureter. The urinary bladder is unremarkable. Stomach/Bowel: Stomach is within normal limits. No evidence of small bowel wall thickening or dilatation. Under distended descending colon demonstrates lack of haustration  with slightly improved but persistent bowel wall thickening and suggestion of mucosal hyperemia. Colonic diverticulosis. Appendix appears normal. Vascular/Lymphatic: no abdominal aorta or iliac aneurysm. At least mild atherosclerotic plaque of the aorta and its branches. No abdominal, pelvic, or inguinal lymphadenopathy. Reproductive: Uterus and bilateral adnexa are unremarkable. Other: No intraperitoneal free fluid. No intraperitoneal free gas. No organized fluid collection. Musculoskeletal: No abdominal wall hernia or abnormality. No suspicious lytic or blastic osseous lesions. No acute displaced fracture. Chronic stable T12 and L1 compression fractures. IMPRESSION: 1. Slightly improved but persistent descending colitis. 2. Colonic diverticulosis with no acute diverticulitis. 3. At least small volume hiatal hernia. Electronically Signed   By: Tish Frederickson M.D.   On: 06/02/2023 23:00    Assessment/Plan Present on Admission:  Persistent C. difficile colitis -Initial treatment failure.  P.o. Vanco ordered -PT consult for patient's weakness. -Gentle IV fluid hydration   HTN (hypertension) -Lisinopril and metoprolol -HCTZ on hold   Hyperlipidemia -Crestor resumed   Depression -Lexapro resumed  Donivan Thammavong 06/03/2023, 2:14 AM

## 2023-06-03 NOTE — Progress Notes (Signed)
[facility] PROGRESS NOTE    Progress Note  Kimberly Beck  ZOX:[id number] DOB: 12-03-1945 DOA: 06/02/2023 PCP: Kimberly Soho, PA-C     Brief Narrative:   Kimberly Beck is an 77 y.o. female past medical history significant for lung cancer in remission, essential hypertension, recently discharged in 05/17/2023 for C. difficile colitis on oral vancomycin, per patient she completed her course but the diarrhea did not resolve.  The ED potassium 3.1 CT scan of the abdomen pelvis showed slight improvement of persistent colitis    Assessment/Plan:   Clostridium difficile colitis: Started on IV fluids and p.o. vancomycin which she has failed.  She is probably not being compliant with her oral vancomycin in the middle of the night. Discontinue oral vancomycin started on Dificid. Monitor strict I's and O's number of bowel movements and consistency.  Essential HTN (hypertension) Hold hydrochlorothiazide, continue metoprolol and lisinopril.  Hypokalemia: Low replete orally recheck in the morning.  Hyperlipidemia Continue statins.  Normocytic anemia: No signs of overt bleeding follow-up with PCP as an outpatient.  Thrombocytosis: Likely reactive in the setting of active C. difficile now resolved.  DVT prophylaxis: lovenox Family Communication:none Status is: Observation The patient will require care spanning > 2 midnights and should be moved to inpatient because: Acute C. difficile colitis    Code Status:     Code Status Orders  (From admission, onward)           Start     Ordered   06/03/23 0256  Full code  Continuous       Question:  By:  Answer:  Consent: discussion documented in EHR   06/03/23 0329           Code Status History     Date Active Date Inactive Code Status Order ID Comments User Context   05/17/2023 0452 05/22/2023 2342 Full Code [id number]  Kimberly Pray, MD ED   07/29/2020 1535 07/30/2020 1909 Full Code [id number]  Kimberly Balls, PA-C Inpatient   04/29/2020 1650 05/01/2020 1647 Full Code [id number]  Kimberly Roca, PA-C Inpatient         IV Access:   Peripheral IV   Procedures and diagnostic studies:   CT ABDOMEN PELVIS W CONTRAST  Result Date: 06/02/2023 CLINICAL DATA:  Abdominal pain, acute, nonlocalized L sided abd pain Nausea; Diarrhea. Abdominal pain, acute, nonlocalized, L sided abd pain. Omni 350 65mL See ED Notes: 77 year old female with past medical history of recent C. difficile infection EXAM: CT ABDOMEN AND PELVIS WITH CONTRAST TECHNIQUE: Multidetector CT imaging of the abdomen and pelvis was performed using the standard protocol following bolus administration of intravenous contrast. RADIATION DOSE REDUCTION: This exam was performed according to the departmental dose-optimization program which includes automated exposure control, adjustment of the mA and/or kV according to patient size and/or use of iterative reconstruction technique. CONTRAST:  65mL OMNIPAQUE IOHEXOL 350 MG/ML SOLN COMPARISON:  CT lumbar spine 05/17/2023. FINDINGS: Lower chest: At least small volume hiatal hernia. Hepatobiliary: Fluid density lesions likely represent simple renal cysts. No gallstones, gallbladder wall thickening, or pericholecystic fluid. No biliary dilatation. Pancreas: No focal lesion. Normal pancreatic contour. No surrounding inflammatory changes. No main pancreatic ductal dilatation. Spleen: Normal in size without focal abnormality. Adrenals/Urinary Tract: No adrenal nodule bilaterally. Bilateral kidneys enhance symmetrically. No hydronephrosis. No hydroureter. The urinary bladder is unremarkable. Stomach/Bowel: Stomach is within normal limits. No evidence of small bowel wall thickening or dilatation. Under distended descending colon demonstrates lack of haustration with slightly improved  but persistent bowel wall thickening and suggestion of mucosal hyperemia. Colonic diverticulosis. Appendix appears  normal. Vascular/Lymphatic: no abdominal aorta or iliac aneurysm. At least mild atherosclerotic plaque of the aorta and its branches. No abdominal, pelvic, or inguinal lymphadenopathy. Reproductive: Uterus and bilateral adnexa are unremarkable. Other: No intraperitoneal free fluid. No intraperitoneal free gas. No organized fluid collection. Musculoskeletal: No abdominal wall hernia or abnormality. No suspicious lytic or blastic osseous lesions. No acute displaced fracture. Chronic stable T12 and L1 compression fractures. IMPRESSION: 1. Slightly improved but persistent descending colitis. 2. Colonic diverticulosis with no acute diverticulitis. 3. At least small volume hiatal hernia. Electronically Signed   By: Kimberly Beck M.D.   On: 06/02/2023 23:00     Medical Consultants:   None.   Subjective:    Kimberly Beck relates had a watery stool this morning  Objective:    Vitals:   06/03/23 0327 06/03/23 0328 06/03/23 0328 06/03/23 0400  BP: (!) 143/69   (!) 154/66  Pulse:    73  Resp: 18 14  16   Temp:   97.9 F (36.6 C) 98.9 F (37.2 C)  TempSrc:   Oral Oral  SpO2:    100%  Weight:      Height:       SpO2: 100 %  No intake or output data in the 24 hours ending 06/03/23 0651 Filed Weights   06/02/23 1858  Weight: 69 kg    Exam: General exam: In no acute distress. Respiratory system: Good air movement and clear to auscultation. Cardiovascular system: S1 & S2 heard, RRR. No JVD. Gastrointestinal system: Abdomen is nondistended, soft and nontender.  Extremities: No pedal edema. Skin: No rashes, lesions or ulcers Psychiatry: Judgement and insight appear normal. Mood & affect appropriate.    Data Reviewed:    Labs: Basic Metabolic Panel: Recent Labs  Lab 06/02/23 1948  NA 140  K 3.1*  CL 104  CO2 21*  GLUCOSE 142*  BUN 12  CREATININE 0.96  CALCIUM 9.9   GFR Estimated Creatinine Clearance: 46.7 mL/min (by C-G formula based on SCr of 0.96 mg/dL). Liver  Function Tests: Recent Labs  Lab 06/02/23 1948  AST 27  ALT 14  ALKPHOS 119  BILITOT 1.4*  PROT 6.5  ALBUMIN 3.5   Recent Labs  Lab 06/02/23 1948  LIPASE 34   No results for input(s): "AMMONIA" in the last 168 hours. Coagulation profile No results for input(s): "INR", "PROTIME" in the last 168 hours. COVID-19 Labs  No results for input(s): "DDIMER", "FERRITIN", "LDH", "CRP" in the last 72 hours.  Lab Results  Component Value Date   SARSCOV2NAA NEGATIVE 07/27/2020   SARSCOV2NAA NEGATIVE 04/27/2020    CBC: Recent Labs  Lab 06/02/23 1948 06/03/23 0536  WBC 9.0 6.7  NEUTROABS 6.1 4.2  HGB 13.5 11.1*  HCT 42.2 34.9*  MCV 90.2 92.3  PLT 432* 348   Cardiac Enzymes: No results for input(s): "CKTOTAL", "CKMB", "CKMBINDEX", "TROPONINI" in the last 168 hours. BNP (last 3 results) No results for input(s): "PROBNP" in the last 8760 hours. CBG: No results for input(s): "GLUCAP" in the last 168 hours. D-Dimer: No results for input(s): "DDIMER" in the last 72 hours. Hgb A1c: No results for input(s): "HGBA1C" in the last 72 hours. Lipid Profile: No results for input(s): "CHOL", "HDL", "LDLCALC", "TRIG", "CHOLHDL", "LDLDIRECT" in the last 72 hours. Thyroid function studies: No results for input(s): "TSH", "T4TOTAL", "T3FREE", "THYROIDAB" in the last 72 hours.  Invalid input(s): "FREET3" Anemia work up:  No results for input(s): "VITAMINB12", "FOLATE", "FERRITIN", "TIBC", "IRON", "RETICCTPCT" in the last 72 hours. Sepsis Labs: Recent Labs  Lab 06/02/23 1948 06/02/23 2104 06/03/23 0536  WBC 9.0  --  6.7  LATICACIDVEN 1.8 1.6  --    Microbiology No results found for this or any previous visit (from the past 240 hour(s)).   Medications:    aspirin EC  81 mg Oral Daily   escitalopram  10 mg Oral Daily   famotidine  20 mg Oral Daily   heparin  5,000 Units Subcutaneous Q8H   lisinopril  20 mg Oral Daily   metoprolol succinate  25 mg Oral Daily   montelukast  10  mg Oral Daily   rosuvastatin  5 mg Oral Daily   vancomycin  125 mg Oral TID AC & HS   Followed by   Melene Muller ON 06/17/2023] vancomycin  125 mg Oral BID   Followed by   Melene Muller ON 06/24/2023] vancomycin  125 mg Oral Daily   Followed by   Melene Muller ON 07/01/2023] vancomycin  125 mg Oral QODAY   Followed by   Melene Muller ON 07/09/2023] vancomycin  125 mg Oral Q3 days   Continuous Infusions:    LOS: 0 days   Marinda Elk  [facility]  06/03/2023, 6:51 AM

## 2023-06-03 NOTE — Plan of Care (Signed)

## 2023-06-03 NOTE — Evaluation (Signed)
Physical Therapy Evaluation Patient Details Name: LUANE LITTIG MRN: [id number] DOB: [birth year] Today's Date: 06/03/2023  History of Present Illness  Kimberly Beck is an 77 y.o. female past medical history significant for lung cancer in remission, essential hypertension, recently discharged in 05/17/2023 for C. difficile colitis on oral vancomycin, per patient she completed her course but the diarrhea did not resolve.  Clinical Impression  Pt presents with admitting diagnosis above. Pt today was able to ambulate short distance in hallway with no AD CGA. Pt states this is the distance that she ambulates at home to bathroom. Pt appears to be fairly close to her baseline and states that she about to start HHPT prior to this readmission. Recommend pt continue with HHPT. PT will continue to follow.       If plan is discharge home, recommend the following: A little help with walking and/or transfers;A little help with bathing/dressing/bathroom;Assist for transportation;Assistance with cooking/housework;Help with stairs or ramp for entrance   Can travel by private vehicle        Equipment Recommendations None recommended by PT  Recommendations for Other Services  OT consult    Functional Status Assessment Patient has had a recent decline in their functional status and demonstrates the ability to make significant improvements in function in a reasonable and predictable amount of time.     Precautions / Restrictions Precautions Precautions: Fall Precaution Comments: pt recently fell and fractures R humerus Required Braces or Orthoses: Sling Restrictions Weight Bearing Restrictions: Yes RUE Weight Bearing: Non weight bearing      Mobility  Bed Mobility Overal bed mobility: Modified Independent Bed Mobility: Supine to Sit     Supine to sit: Modified independent (Device/Increase time)          Transfers Overall transfer level: Needs assistance Equipment used:  None Transfers: Sit to/from Stand, Bed to chair/wheelchair/BSC Sit to Stand: Contact guard assist           General transfer comment: no physical assist. CGA for safety.    Ambulation/Gait Ambulation/Gait assistance: Contact guard assist Gait Distance (Feet): 25 Feet Assistive device: None Gait Pattern/deviations: Step-through pattern, Trunk flexed, Decreased stride length Gait velocity: decreased     General Gait Details: no LOB noted. Pt states this is the distance she has been ambulating at home to use restroom.  Stairs            Wheelchair Mobility     Tilt Bed    Modified Rankin (Stroke Patients Only)       Balance Overall balance assessment: Mild deficits observed, not formally tested                                           Pertinent Vitals/Pain Pain Assessment Pain Assessment: No/denies pain    Home Living Family/patient expects to be discharged to:: Private residence Living Arrangements: Spouse/significant other Available Help at Discharge: Family;Available PRN/intermittently Type of Home: House Home Access: Level entry       Home Layout: One level Home Equipment: Hospital bed;Wheelchair - Biomedical scientist (2 wheels);Cane - single point;BSC/3in1;Lift chair Additional Comments: pt reports that significant other is older than her and has Parkinsons and cannot assist her physically    Prior Function Prior Level of Function : Needs assist;History of Falls (last six months)             Mobility Comments: pt  reports she has been getting HHPT because she has been getting progressively weaker to the point that she can't get up from her chair and the toilet. This was before she fell, has not been mobile since she fell (No change since previous admission) ADLs Comments: significant other helps as able, ADL's have been getting harder     Extremity/Trunk Assessment   Upper Extremity Assessment Upper Extremity  Assessment: RUE deficits/detail RUE Deficits / Details: non operative proximal shoulder fracture.    Lower Extremity Assessment Lower Extremity Assessment: Generalized weakness    Cervical / Trunk Assessment Cervical / Trunk Assessment: Kyphotic  Communication   Communication Communication: No apparent difficulties Cueing Techniques: Verbal cues  Cognition Arousal: Alert Behavior During Therapy: WFL for tasks assessed/performed Overall Cognitive Status: Within Functional Limits for tasks assessed                                 General Comments: Very pleasant and talkative.        General Comments General comments (skin integrity, edema, etc.): VSS on RA    Exercises     Assessment/Plan    PT Assessment Patient needs continued PT services  PT Problem List Decreased strength;Decreased activity tolerance;Decreased balance;Decreased mobility;Decreased coordination;Decreased knowledge of use of DME;Decreased knowledge of precautions;Pain       PT Treatment Interventions DME instruction;Gait training;Functional mobility training;Therapeutic activities;Therapeutic exercise;Balance training;Neuromuscular re-education;Patient/family education    PT Goals (Current goals can be found in the Care Plan section)  Acute Rehab PT Goals Patient Stated Goal: home PT Goal Formulation: With patient Time For Goal Achievement: 06/17/23 Potential to Achieve Goals: Good    Frequency Min 1X/week     Co-evaluation               AM-PAC PT "6 Clicks" Mobility  Outcome Measure Help needed turning from your back to your side while in a flat bed without using bedrails?: None Help needed moving from lying on your back to sitting on the side of a flat bed without using bedrails?: None Help needed moving to and from a bed to a chair (including a wheelchair)?: A Little Help needed standing up from a chair using your arms (e.g., wheelchair or bedside chair)?: A Little Help  needed to walk in hospital room?: A Little Help needed climbing 3-5 steps with a railing? : A Little 6 Click Score: 20    End of Session Equipment Utilized During Treatment: Gait belt;Other (comment) (Sling) Activity Tolerance: Patient tolerated treatment well Patient left: in bed;with call bell/phone within reach Nurse Communication: Mobility status PT Visit Diagnosis: Unsteadiness on feet (R26.81);History of falling (Z91.81);Muscle weakness (generalized) (M62.81);Pain;Difficulty in walking, not elsewhere classified (R26.2) Pain - Right/Left: Right Pain - part of body: Shoulder    Time: 6045-4098 PT Time Calculation (min) (ACUTE ONLY): 21 min   Charges:   PT Evaluation $PT Eval Moderate Complexity: 1 Mod   PT General Charges $$ ACUTE PT VISIT: 1 Visit         Shela Nevin, PT, DPT Acute Rehab Services [phone]   Gladys Damme 06/03/2023, 1:13 PM

## 2023-06-03 NOTE — ED Notes (Signed)
ED TO INPATIENT HANDOFF REPORT  ED Nurse Name and Phone #: Norlene Duel [phone]  S Name/Age/Gender Kimberly Beck 77 y.o. female Room/Bed: 043C/043C  Code Status   Code Status: Prior  Home/SNF/Other Home Patient oriented to: self, place, time, and situation Is this baseline? Yes   Triage Complete: Triage complete  Chief Complaint Clostridium difficile colitis [A04.72]  Triage Note Pt to ER via EMS from home with c/o vomiting that started last night and diarrhea that has been ongoing for several weeks.  Pt has been treated for C-diff.    Allergies Allergies  Allergen Reactions   Codeine Itching    Drunk feeling    Nsaids Other (See Comments)    Liver    Other     Other reaction(s): diarrhea and headache Other reaction(s): Global anesthesia Other reaction(s): edema   Penicillins Rash    Dry rash    Level of Care/Admitting Diagnosis ED Disposition     ED Disposition  Admit   Condition  --   Comment  Hospital Area: [facility] [[id number]]  Level of Care: Med-Surg [16]  May place patient in observation at [facility] or [facility] if equivalent level of care is available:: Yes  Covid Evaluation: Asymptomatic - no recent exposure (last 10 days) testing not required  Diagnosis: Clostridium difficile colitis [981191]  Admitting Physician: Gery Pray [[id number]]  Attending Physician: Gery Pray [[id number]]          B Medical/Surgery History Past Medical History:  Diagnosis Date   Allergy    Anemia    Cancer (HCC)    Complication of anesthesia    Depression    DVT, lower extremity, recurrent, right (HCC)    History of hiatal hernia    HTN (hypertension)    Lung cancer (HCC)    Right   Pneumonia    PONV (postoperative nausea and vomiting)    N/V only after receiving ether.   Pre-diabetes    BS checks daily, takes no medicines   Past Surgical History:  Procedure Laterality Date   ESOPHAGOGASTRODUODENOSCOPY N/A 07/29/2020    Procedure: ESOPHAGOGASTRODUODENOSCOPY (EGD);  Surgeon: Corliss Skains, MD;  Location: [facility] OR;  Service: Thoracic;  Laterality: N/A;   EYE SURGERY     Bilateral cataract removal   INTERCOSTAL NERVE BLOCK Right 04/29/2020   Procedure: INTERCOSTAL NERVE BLOCK;  Surgeon: Corliss Skains, MD;  Location: [facility] OR;  Service: Thoracic;  Laterality: Right;   LYMPH NODE DISSECTION Right 04/29/2020   Procedure: LYMPH NODE DISSECTION;  Surgeon: Corliss Skains, MD;  Location: [facility] OR;  Service: Thoracic;  Laterality: Right;   TONSILLECTOMY     TONSILLECTOMY AND ADENOIDECTOMY     TUBAL LIGATION     XI ROBOTIC ASSISTED PARAESOPHAGEAL HERNIA REPAIR N/A 07/29/2020   Procedure: XI ROBOTIC ASSISTED PARAESOPHAGEAL HERNIA REPAIR;  Surgeon: Corliss Skains, MD;  Location: [facility] OR;  Service: Thoracic;  Laterality: N/A;     A IV Location/Drains/Wounds Patient Lines/Drains/Airways Status     Active Line/Drains/Airways     Name Placement date Placement time Site Days   Peripheral IV 06/02/23 20 G Left Antecubital 06/02/23  1853  Antecubital  1            Intake/Output Last 24 hours No intake or output data in the 24 hours ending 06/03/23 0305  Labs/Imaging Results for orders placed or performed during the hospital encounter of 06/02/23 (from the past 48 hour(s))  CBC with Differential  Status: Abnormal   Collection Time: 06/02/23  7:48 PM  Result Value Ref Range   WBC 9.0 4.0 - 10.5 K/uL   RBC 4.68 3.87 - 5.11 MIL/uL   Hemoglobin 13.5 12.0 - 15.0 g/dL   HCT 42.7 06.2 - 37.6 %   MCV 90.2 80.0 - 100.0 fL   MCH 28.8 26.0 - 34.0 pg   MCHC 32.0 30.0 - 36.0 g/dL   RDW 28.3 15.1 - 76.1 %   Platelets 432 (H) 150 - 400 K/uL   nRBC 0.0 0.0 - 0.2 %   Neutrophils Relative % 69 %   Neutro Abs 6.1 1.7 - 7.7 K/uL   Lymphocytes Relative 20 %   Lymphs Abs 1.8 0.7 - 4.0 K/uL   Monocytes Relative 9 %   Monocytes Absolute 0.8 0.1 - 1.0 K/uL   Eosinophils Relative 1 %   Eosinophils Absolute  0.1 0.0 - 0.5 K/uL   Basophils Relative 1 %   Basophils Absolute 0.1 0.0 - 0.1 K/uL   Immature Granulocytes 0 %   Abs Immature Granulocytes 0.04 0.00 - 0.07 K/uL    Comment: Performed at [facility], [address], [place], [place] [address]  Comprehensive metabolic panel     Status: Abnormal   Collection Time: 06/02/23  7:48 PM  Result Value Ref Range   Sodium 140 135 - 145 mmol/L   Potassium 3.1 (L) 3.5 - 5.1 mmol/L   Chloride 104 98 - 111 mmol/L   CO2 21 (L) 22 - 32 mmol/L   Glucose, Bld 142 (H) 70 - 99 mg/dL    Comment: Glucose reference range applies only to samples taken after fasting for at least 8 hours.   BUN 12 8 - 23 mg/dL   Creatinine, Ser 1.06 0.44 - 1.00 mg/dL   Calcium 9.9 8.9 - 26.9 mg/dL   Total Protein 6.5 6.5 - 8.1 g/dL   Albumin 3.5 3.5 - 5.0 g/dL   AST 27 15 - 41 U/L   ALT 14 0 - 44 U/L   Alkaline Phosphatase 119 38 - 126 U/L   Total Bilirubin 1.4 (H) <1.2 mg/dL   GFR, Estimated >48 >54 mL/min    Comment: (NOTE) Calculated using the CKD-EPI Creatinine Equation (2021)    Anion gap 15 5 - 15    Comment: Performed at [facility], [address], [place], [place] [address]  Lipase, blood     Status: None   Collection Time: 06/02/23  7:48 PM  Result Value Ref Range   Lipase 34 11 - 51 U/L    Comment: Performed at [facility], [address] [place], [place] [address]  Lactic acid, plasma     Status: None   Collection Time: 06/02/23  7:48 PM  Result Value Ref Range   Lactic Acid, Venous 1.8 0.5 - 1.9 mmol/L    Comment: Performed at [facility], [address] [place], [place] [address]  Lactic acid, plasma     Status: None   Collection Time: 06/02/23  9:04 PM  Result Value Ref Range   Lactic Acid, Venous 1.6 0.5 - 1.9 mmol/L    Comment: Performed at [facility], [address], [place], [place] [address]  Urinalysis, Routine w reflex microscopic -Urine, Clean Catch     Status: Abnormal   Collection Time: 06/03/23 12:47  AM  Result Value Ref Range   Color, Urine YELLOW YELLOW   APPearance CLEAR CLEAR  Specific Gravity, Urine 1.039 (H) 1.005 - 1.030   pH 7.0 5.0 - 8.0   Glucose, UA NEGATIVE NEGATIVE mg/dL   Hgb urine dipstick SMALL (A) NEGATIVE   Bilirubin Urine NEGATIVE NEGATIVE   Ketones, ur 5 (A) NEGATIVE mg/dL   Protein, ur NEGATIVE NEGATIVE mg/dL   Nitrite NEGATIVE NEGATIVE   Leukocytes,Ua NEGATIVE NEGATIVE   RBC / HPF 0-5 0 - 5 RBC/hpf   WBC, UA 0-5 0 - 5 WBC/hpf   Bacteria, UA NONE SEEN NONE SEEN   Squamous Epithelial / HPF 0-5 0 - 5 /HPF    Comment: Performed at [facility], [address], [place], [place] [address]   CT ABDOMEN PELVIS W CONTRAST  Result Date: 06/02/2023 CLINICAL DATA:  Abdominal pain, acute, nonlocalized L sided abd pain Nausea; Diarrhea. Abdominal pain, acute, nonlocalized, L sided abd pain. Omni 350 65mL See ED Notes: 77 year old female with past medical history of recent C. difficile infection EXAM: CT ABDOMEN AND PELVIS WITH CONTRAST TECHNIQUE: Multidetector CT imaging of the abdomen and pelvis was performed using the standard protocol following bolus administration of intravenous contrast. RADIATION DOSE REDUCTION: This exam was performed according to the departmental dose-optimization program which includes automated exposure control, adjustment of the mA and/or kV according to patient size and/or use of iterative reconstruction technique. CONTRAST:  65mL OMNIPAQUE IOHEXOL 350 MG/ML SOLN COMPARISON:  CT lumbar spine 05/17/2023. FINDINGS: Lower chest: At least small volume hiatal hernia. Hepatobiliary: Fluid density lesions likely represent simple renal cysts. No gallstones, gallbladder wall thickening, or pericholecystic fluid. No biliary dilatation. Pancreas: No focal lesion. Normal pancreatic contour. No surrounding inflammatory changes. No main pancreatic ductal dilatation. Spleen: Normal in size without focal abnormality. Adrenals/Urinary Tract: No adrenal nodule  bilaterally. Bilateral kidneys enhance symmetrically. No hydronephrosis. No hydroureter. The urinary bladder is unremarkable. Stomach/Bowel: Stomach is within normal limits. No evidence of small bowel wall thickening or dilatation. Under distended descending colon demonstrates lack of haustration with slightly improved but persistent bowel wall thickening and suggestion of mucosal hyperemia. Colonic diverticulosis. Appendix appears normal. Vascular/Lymphatic: no abdominal aorta or iliac aneurysm. At least mild atherosclerotic plaque of the aorta and its branches. No abdominal, pelvic, or inguinal lymphadenopathy. Reproductive: Uterus and bilateral adnexa are unremarkable. Other: No intraperitoneal free fluid. No intraperitoneal free gas. No organized fluid collection. Musculoskeletal: No abdominal wall hernia or abnormality. No suspicious lytic or blastic osseous lesions. No acute displaced fracture. Chronic stable T12 and L1 compression fractures. IMPRESSION: 1. Slightly improved but persistent descending colitis. 2. Colonic diverticulosis with no acute diverticulitis. 3. At least small volume hiatal hernia. Electronically Signed   By: Tish Frederickson M.D.   On: 06/02/2023 23:00    Pending Labs Unresulted Labs (From admission, onward)     Start     Ordered   06/02/23 2231  C Difficile Quick Screen w PCR reflex  (C Difficile quick screen w PCR reflex panel )  Once, for 24 hours,   URGENT       References:    CDiff Information Tool   06/02/23 2230            Vitals/Pain Today's Vitals   06/03/23 0040 06/03/23 0056 06/03/23 0100 06/03/23 0200  BP: (!) 147/82  (!) 153/77 (!) 151/77  Pulse: 96  99 89  Resp: 15  18 13   Temp:      TempSrc:      SpO2: 100%  100% 93%  Weight:      Height:  PainSc:  2       Isolation Precautions Enteric precautions (UV disinfection)  Medications Medications  vancomycin (VANCOCIN) capsule 125 mg (has no administration in time range)    Followed by   vancomycin (VANCOCIN) capsule 125 mg (has no administration in time range)    Followed by  vancomycin (VANCOCIN) capsule 125 mg (has no administration in time range)    Followed by  vancomycin (VANCOCIN) capsule 125 mg (has no administration in time range)    Followed by  vancomycin (VANCOCIN) capsule 125 mg (has no administration in time range)  sodium chloride 0.9 % bolus 1,000 mL (0 mLs Intravenous Stopped 06/02/23 2123)  ondansetron (ZOFRAN) injection 4 mg (4 mg Intravenous Given 06/02/23 1950)  sodium chloride 0.9 % bolus 1,000 mL (0 mLs Intravenous Stopped 06/02/23 2223)  meclizine (ANTIVERT) tablet 25 mg (25 mg Oral Given 06/02/23 2105)  ondansetron (ZOFRAN) injection 4 mg (4 mg Intravenous Given 06/02/23 2104)  LORazepam (ATIVAN) injection 0.5 mg (0.5 mg Intravenous Given 06/02/23 2124)  iohexol (OMNIPAQUE) 350 MG/ML injection 65 mL (65 mLs Intravenous Contrast Given 06/02/23 2239)  potassium chloride 10 mEq in 100 mL IVPB (0 mEq Intravenous Stopped 06/03/23 0243)    Mobility walks     Focused Assessments GI assessment - N/V/D   R Recommendations: See Admitting Provider Note  Report given to:   Additional Notes: Hx of C.Diff - need stool sample (has not had episode of diarrhea since being here). CT shows colitis but denies abd pain. N/V/D since yesterday (Friday). Hx of broken R shoulder from fall (in sling currently).

## 2023-06-04 ENCOUNTER — Other Ambulatory Visit (HOSPITAL_COMMUNITY): Payer: Self-pay

## 2023-06-04 ENCOUNTER — Telehealth (HOSPITAL_COMMUNITY): Payer: Self-pay | Admitting: Pharmacy Technician

## 2023-06-04 DIAGNOSIS — A0472 Enterocolitis due to Clostridium difficile, not specified as recurrent: Secondary | ICD-10-CM | POA: Diagnosis not present

## 2023-06-04 LAB — BASIC METABOLIC PANEL
Anion gap: 9 (ref 5–15)
BUN: 8 mg/dL (ref 8–23)
CO2: 20 mmol/L — ABNORMAL LOW (ref 22–32)
Calcium: 8.3 mg/dL — ABNORMAL LOW (ref 8.9–10.3)
Chloride: 112 mmol/L — ABNORMAL HIGH (ref 98–111)
Creatinine, Ser: 0.83 mg/dL (ref 0.44–1.00)
GFR, Estimated: >60 mL/min (ref >60)
Glucose, Bld: 140 mg/dL — ABNORMAL HIGH (ref 70–99)
Potassium: 4.2 mmol/L (ref 3.5–5.1)
Sodium: 141 mmol/L (ref 135–145)

## 2023-06-04 LAB — MAGNESIUM: Magnesium: 1.7 mg/dL (ref 1.7–2.4)

## 2023-06-04 MED ORDER — MAGNESIUM SULFATE 2 GM/50ML IV SOLN
2.0000 g | Freq: Once | INTRAVENOUS | Status: AC
  Administered 2023-06-04: 2 g via INTRAVENOUS
  Filled 2023-06-04: qty 50

## 2023-06-04 NOTE — Progress Notes (Signed)
Physical Therapy Treatment Patient Details Name: Kimberly Beck MRN: [id number] DOB: 1946/01/27 Today's Date: 06/04/2023   History of Present Illness Pt is 77 yo female who presents on 11/30 with persistent diarrhea (admitted for c-diff 11/14-11/19). Pt admitted for medical management of c-diff. PMH: lung cancer, DVT, L1 compression fracture, back pain, hypertension, hyperlipidemia, recent fall with right proximal humerus fracture 05/14/23    PT Comments  Returned for exercise training with resistance bands to meet pt's goal of improved strength and power in lower extremities to allow for sit-stand transfers without use of her upper extremities. Pt provided with various levels of theraband for progressive strengthening and given written handout. Pt was able to demo exercises in handout with good adherence, minimal cues for technique and activation. Pt verbalized understanding and was thankful for education.     If plan is discharge home, recommend the following: A little help with walking and/or transfers;A little help with bathing/dressing/bathroom;Assist for transportation;Assistance with cooking/housework;Help with stairs or ramp for entrance   Can travel by private vehicle     Yes  Equipment Recommendations  None recommended by PT    Recommendations for Other Services       Precautions / Restrictions Precautions Precautions: Fall Precaution Comments: pt recently fell and fractured R humerus Required Braces or Orthoses: Sling Restrictions Weight Bearing Restrictions: Yes RUE Weight Bearing: Non weight bearing     Mobility  Bed Mobility Overal bed mobility: Modified Independent                  Transfers Overall transfer level: Needs assistance Equipment used: None Transfers: Sit to/from Stand Sit to Stand: Supervision   Step pivot transfers: Supervision       General transfer comment: supervision within session, continues to use LUE to power up     Ambulation/Gait Ambulation/Gait assistance: Contact guard assist, Supervision Gait Distance (Feet): 500 Feet Assistive device: None Gait Pattern/deviations: Step-through pattern, Trunk flexed, Decreased stride length Gait velocity: decreased Gait velocity interpretation: 1.31 - 2.62 ft/sec, indicative of limited community ambulator   General Gait Details: session focused on LE exercises     Balance Overall balance assessment: Mild deficits observed, not formally tested Sitting-balance support: No upper extremity supported, Feet supported Sitting balance-Leahy Scale: Good     Standing balance support: No upper extremity supported, During functional activity Standing balance-Leahy Scale: Fair Standing balance comment: no UE support                            Cognition Arousal: Alert Behavior During Therapy: WFL for tasks assessed/performed Overall Cognitive Status: Within Functional Limits for tasks assessed                                 General Comments: Very pleasant and talkative.        Exercises General Exercises - Lower Extremity Ankle Circles/Pumps: Strengthening, Both, 10 reps, Other (comment) (against blue theraband) Long Arc Quad: Strengthening, Both, 10 reps, Seated, Other (comment) (with red theraband loop) Hip ABduction/ADduction: Strengthening, Both, 10 reps, Seated, Other (comment), Standing (with red theraband loop in seated, no theraband in standing) Other Exercises Other Exercises: sit-stand from EOB x5 Other Exercises: isometric hold hip adduction x 5 Other Exercises: standing hip extension with red theraband loop x10    General Comments General comments (skin integrity, edema, etc.): VSS on RA, HEP and therabands provided  Pertinent Vitals/Pain Pain Assessment Pain Assessment: Faces Faces Pain Scale: Hurts a little bit Pain Location: RUE Pain Descriptors / Indicators: Grimacing Pain Intervention(s): Limited  activity within patient's tolerance, Monitored during session, Repositioned     PT Goals (current goals can now be found in the care plan section) Acute Rehab PT Goals Patient Stated Goal: to be able to stand up from toilet/char without use of her arms PT Goal Formulation: With patient Time For Goal Achievement: 06/17/23 Potential to Achieve Goals: Good Progress towards PT goals: Progressing toward goals    Frequency    Min 1X/week       AM-PAC PT "6 Clicks" Mobility   Outcome Measure  Help needed turning from your back to your side while in a flat bed without using bedrails?: None Help needed moving from lying on your back to sitting on the side of a flat bed without using bedrails?: None Help needed moving to and from a bed to a chair (including a wheelchair)?: A Little Help needed standing up from a chair using your arms (e.g., wheelchair or bedside chair)?: A Little Help needed to walk in hospital room?: A Little Help needed climbing 3-5 steps with a railing? : A Little 6 Click Score: 20    End of Session Equipment Utilized During Treatment: Gait belt Activity Tolerance: Patient tolerated treatment well Patient left: in bed;with call bell/phone within reach Nurse Communication: Mobility status PT Visit Diagnosis: Unsteadiness on feet (R26.81);History of falling (Z91.81);Muscle weakness (generalized) (M62.81);Pain;Difficulty in walking, not elsewhere classified (R26.2) Pain - Right/Left: Right Pain - part of body: Shoulder     Time: 0981-1914 PT Time Calculation (min) (ACUTE ONLY): 43 min  Charges:    $Gait Training: 8-22 mins $Therapeutic Exercise: 23-37 mins $Therapeutic Activity: 8-22 mins PT General Charges $$ ACUTE PT VISIT: 1 Visit                     Vickki Muff, PT, DPT   Acute Rehabilitation Department Office [phone] Secure Chat Communication Preferred   Ronnie Derby 06/04/2023, 3:30 PM

## 2023-06-04 NOTE — Progress Notes (Signed)
   06/04/23 0925  Mobility  Activity Ambulated independently in hallway  Level of Assistance Standby assist, set-up cues, supervision of patient - no hands on  Assistive Device None  Distance Ambulated (ft) 75 ft  RUE Weight Bearing NWB  Activity Response Tolerated well  Mobility Referral Yes  $Mobility charge 1 Mobility  Mobility Specialist Start Time (ACUTE ONLY) 0913  Mobility Specialist Stop Time (ACUTE ONLY) 0925  Mobility Specialist Time Calculation (min) (ACUTE ONLY) 12 min   Mobility Specialist: Progress Note  Pt agreeable to mobility session - received in bed. Required SB using no AD. Pt was asymptomatic throughout session with no complaints. Returned to bed with all needs met - call bell within reach.   Barnie Mort, BS Mobility Specialist Please contact via SecureChat or Rehab office at [phone].

## 2023-06-04 NOTE — Progress Notes (Signed)
[facility] PROGRESS NOTE    Progress Note  KEATHER MCMULLIN  WUJ:[id number] DOB: 1946-04-12 DOA: 06/02/2023 PCP: Jarrett Soho, PA-C     Brief Narrative:   Kimberly Beck is an 77 y.o. female past medical history significant for lung cancer in remission, essential hypertension, recently discharged in 05/17/2023 for C. difficile colitis on oral vancomycin, per patient she completed her course but the diarrhea did not resolve.  The ED potassium 3.1 CT scan of the abdomen pelvis showed slight improvement of persistent colitis    Assessment/Plan:   Clostridium difficile colitis: Started on IV fluids and p.o. vancomycin which she has failed.  She is probably not being compliant with her oral vancomycin in the middle of the night. Discontinue oral vancomycin started on Dificid. Now much improved, only one bm since admission, no abd pain, no fulminant colitis on ct, no aki or leukocytosis. Told patient today she's stable for discharge assuming no significant electrolyte abnormalities, she said she doesn't feel ready so I agreed to one more day of admission but assuming things stable to improving tomorrow will plan on d/c then on dificid.  Essential HTN (hypertension) Hold hydrochlorothiazide, continue metoprolol and lisinopril.  Hypokalemia: Hypomagnesemia Will f/u today's labs and replete  Hyperlipidemia Continue statins.  Normocytic anemia: No signs of overt bleeding follow-up with PCP as an outpatient.  Thrombocytosis: Likely reactive in the setting of active C. difficile now resolved.   Right humerus fracture Followed by ortho, plan is continue sling, f/u this month (scheduled)  DVT prophylaxis: lovenox Family Communication:none Status is: inpt    Code Status:     Code Status Orders  (From admission, onward)           Start     Ordered   06/03/23 0256  Full code  Continuous       Question:  By:  Answer:  Consent: discussion documented in EHR    06/03/23 0329           Code Status History     Date Active Date Inactive Code Status Order ID Comments User Context   05/17/2023 0452 05/22/2023 2342 Full Code [id number]  Gery Pray, MD ED   07/29/2020 1535 07/30/2020 1909 Full Code [id number]  Ardelle Balls, PA-C Inpatient   04/29/2020 1650 05/01/2020 1647 Full Code [id number]  Leary Roca, PA-C Inpatient         IV Access:   Peripheral IV   Procedures and diagnostic studies:   CT ABDOMEN PELVIS W CONTRAST  Result Date: 06/02/2023 CLINICAL DATA:  Abdominal pain, acute, nonlocalized L sided abd pain Nausea; Diarrhea. Abdominal pain, acute, nonlocalized, L sided abd pain. Omni 350 65mL See ED Notes: 77 year old female with past medical history of recent C. difficile infection EXAM: CT ABDOMEN AND PELVIS WITH CONTRAST TECHNIQUE: Multidetector CT imaging of the abdomen and pelvis was performed using the standard protocol following bolus administration of intravenous contrast. RADIATION DOSE REDUCTION: This exam was performed according to the departmental dose-optimization program which includes automated exposure control, adjustment of the mA and/or kV according to patient size and/or use of iterative reconstruction technique. CONTRAST:  65mL OMNIPAQUE IOHEXOL 350 MG/ML SOLN COMPARISON:  CT lumbar spine 05/17/2023. FINDINGS: Lower chest: At least small volume hiatal hernia. Hepatobiliary: Fluid density lesions likely represent simple renal cysts. No gallstones, gallbladder wall thickening, or pericholecystic fluid. No biliary dilatation. Pancreas: No focal lesion. Normal pancreatic contour. No surrounding inflammatory changes. No main pancreatic ductal dilatation. Spleen: Normal in size without focal  abnormality. Adrenals/Urinary Tract: No adrenal nodule bilaterally. Bilateral kidneys enhance symmetrically. No hydronephrosis. No hydroureter. The urinary bladder is unremarkable. Stomach/Bowel: Stomach is within normal  limits. No evidence of small bowel wall thickening or dilatation. Under distended descending colon demonstrates lack of haustration with slightly improved but persistent bowel wall thickening and suggestion of mucosal hyperemia. Colonic diverticulosis. Appendix appears normal. Vascular/Lymphatic: no abdominal aorta or iliac aneurysm. At least mild atherosclerotic plaque of the aorta and its branches. No abdominal, pelvic, or inguinal lymphadenopathy. Reproductive: Uterus and bilateral adnexa are unremarkable. Other: No intraperitoneal free fluid. No intraperitoneal free gas. No organized fluid collection. Musculoskeletal: No abdominal wall hernia or abnormality. No suspicious lytic or blastic osseous lesions. No acute displaced fracture. Chronic stable T12 and L1 compression fractures. IMPRESSION: 1. Slightly improved but persistent descending colitis. 2. Colonic diverticulosis with no acute diverticulitis. 3. At least small volume hiatal hernia. Electronically Signed   By: Tish Frederickson M.D.   On: 06/02/2023 23:00     Medical Consultants:   None.   Subjective:    Kimberly Beck relates had a watery stool this morning  Objective:    Vitals:   06/03/23 2021 06/04/23 0007 06/04/23 0600 06/04/23 0853  BP: (!) 139/90 (!) 124/55 (!) 146/93 (!) 135/53  Pulse: 99 78 79 74  Resp: 18 18 18 18   Temp: 98.2 F (36.8 C) 98.4 F (36.9 C) 98.1 F (36.7 C) 98 F (36.7 C)  TempSrc: Oral Oral Oral Oral  SpO2: 99% 98% 99% 98%  Weight:      Height:       SpO2: 98 %   Intake/Output Summary (Last 24 hours) at 06/04/2023 1111 Last data filed at 06/04/2023 0900 Gross per 24 hour  Intake 600 ml  Output 0 ml  Net 600 ml   Filed Weights   06/02/23 1858  Weight: 69 kg    Exam: General exam: In no acute distress. Respiratory system: Good air movement and clear to auscultation. Cardiovascular system: S1 & S2 heard, RRR. No JVD. Gastrointestinal system: Abdomen is nondistended, soft and  nontender.  Extremities: No pedal edema. Skin: No rashes, lesions or ulcers Psychiatry: Judgement and insight appear normal. Mood & affect appropriate.    Data Reviewed:    Labs: Basic Metabolic Panel: Recent Labs  Lab 06/02/23 1948 06/03/23 0536  NA 140 142  K 3.1* 3.0*  CL 104 110  CO2 21* 21*  GLUCOSE 142* 98  BUN 12 10  CREATININE 0.96 0.84  CALCIUM 9.9 8.2*  MG  --  1.4*   GFR Estimated Creatinine Clearance: 53.3 mL/min (by C-G formula based on SCr of 0.84 mg/dL). Liver Function Tests: Recent Labs  Lab 06/02/23 1948  AST 27  ALT 14  ALKPHOS 119  BILITOT 1.4*  PROT 6.5  ALBUMIN 3.5   Recent Labs  Lab 06/02/23 1948  LIPASE 34   No results for input(s): "AMMONIA" in the last 168 hours. Coagulation profile No results for input(s): "INR", "PROTIME" in the last 168 hours. COVID-19 Labs  No results for input(s): "DDIMER", "FERRITIN", "LDH", "CRP" in the last 72 hours.  Lab Results  Component Value Date   SARSCOV2NAA NEGATIVE 07/27/2020   SARSCOV2NAA NEGATIVE 04/27/2020    CBC: Recent Labs  Lab 06/02/23 1948 06/03/23 0536  WBC 9.0 6.7  NEUTROABS 6.1 4.2  HGB 13.5 11.1*  HCT 42.2 34.9*  MCV 90.2 92.3  PLT 432* 348   Cardiac Enzymes: No results for input(s): "CKTOTAL", "CKMB", "CKMBINDEX", "TROPONINI" in the  last 168 hours. BNP (last 3 results) No results for input(s): "PROBNP" in the last 8760 hours. CBG: No results for input(s): "GLUCAP" in the last 168 hours. D-Dimer: No results for input(s): "DDIMER" in the last 72 hours. Hgb A1c: No results for input(s): "HGBA1C" in the last 72 hours. Lipid Profile: No results for input(s): "CHOL", "HDL", "LDLCALC", "TRIG", "CHOLHDL", "LDLDIRECT" in the last 72 hours. Thyroid function studies: No results for input(s): "TSH", "T4TOTAL", "T3FREE", "THYROIDAB" in the last 72 hours.  Invalid input(s): "FREET3" Anemia work up: No results for input(s): "VITAMINB12", "FOLATE", "FERRITIN", "TIBC", "IRON",  "RETICCTPCT" in the last 72 hours. Sepsis Labs: Recent Labs  Lab 06/02/23 1948 06/02/23 2104 06/03/23 0536  WBC 9.0  --  6.7  LATICACIDVEN 1.8 1.6  --    Microbiology No results found for this or any previous visit (from the past 240 hour(s)).   Medications:    aspirin EC  81 mg Oral Daily   escitalopram  10 mg Oral Daily   famotidine  20 mg Oral Daily   fidaxomicin  200 mg Oral BID   heparin  5,000 Units Subcutaneous Q8H   lisinopril  20 mg Oral Daily   metoprolol succinate  25 mg Oral Daily   montelukast  10 mg Oral Daily   rosuvastatin  5 mg Oral Daily   Continuous Infusions:    LOS: 1 day   Silvano Bilis  [facility]  06/04/2023, 11:11 AM

## 2023-06-04 NOTE — Progress Notes (Signed)
Physical Therapy Treatment Patient Details Name: Kimberly Beck MRN: [id number] DOB: [birth year] Today's Date: 06/04/2023   History of Present Illness Pt is 77 yo female who presents on 11/30 with persistent diarrhea (admitted for c-diff 11/14-11/19). Pt admitted for medical management of c-diff. PMH: lung cancer, DVT, L1 compression fracture, back pain, hypertension, hyperlipidemia, recent fall with right proximal humerus fracture 05/14/23    PT Comments  The pt was agreeable to session, hopeful for return home soon with improved endurance and stability. The pt was able to dramatically improve her ambulation distance this session (from 25 ft last session to 500 ft today) with CGA to supervision and no UE support. She reports she is most limited by poor LE strength and inability to stand from any chair without use of UE or her lift chair. End of session focused on LE strengthening exercises, sit-stand progression the pt can continue with at home. Will plan to return for further exercise training with resistance bands that pt can use at home, but she is safe from a mobility standpoint to return home once medically cleared.    If plan is discharge home, recommend the following: A little help with walking and/or transfers;A little help with bathing/dressing/bathroom;Assist for transportation;Assistance with cooking/housework;Help with stairs or ramp for entrance   Can travel by private vehicle     Yes  Equipment Recommendations  None recommended by PT    Recommendations for Other Services       Precautions / Restrictions Precautions Precautions: Fall Precaution Comments: pt recently fell and fractured R humerus Required Braces or Orthoses: Sling Restrictions Weight Bearing Restrictions: Yes RUE Weight Bearing: Non weight bearing     Mobility  Bed Mobility Overal bed mobility: Modified Independent                  Transfers Overall transfer level: Needs  assistance Equipment used: None Transfers: Sit to/from Stand, Bed to chair/wheelchair/BSC Sit to Stand: Contact guard assist, Supervision   Step pivot transfers: Supervision       General transfer comment: progressed from CGA to supervision in session    Ambulation/Gait Ambulation/Gait assistance: Contact guard assist, Supervision Gait Distance (Feet): 500 Feet Assistive device: None Gait Pattern/deviations: Step-through pattern, Trunk flexed, Decreased stride length Gait velocity: decreased Gait velocity interpretation: 1.31 - 2.62 ft/sec, indicative of limited community ambulator   General Gait Details: slow but stable, no LOB even with challenge or turning       Balance Overall balance assessment: Mild deficits observed, not formally tested Sitting-balance support: No upper extremity supported, Feet supported Sitting balance-Leahy Scale: Good     Standing balance support: No upper extremity supported, During functional activity Standing balance-Leahy Scale: Fair Standing balance comment: no UE support                            Cognition Arousal: Alert Behavior During Therapy: WFL for tasks assessed/performed Overall Cognitive Status: Within Functional Limits for tasks assessed                                 General Comments: Very pleasant and talkative.        Exercises Other Exercises Other Exercises: sit-stand from recliner. x5 with LUE support only    General Comments General comments (skin integrity, edema, etc.): VSS on RA      Pertinent Vitals/Pain Pain Assessment Pain Assessment: Faces Faces  Pain Scale: Hurts a little bit Pain Location: RUE Pain Descriptors / Indicators: Grimacing Pain Intervention(s): Limited activity within patient's tolerance, Monitored during session, Repositioned     PT Goals (current goals can now be found in the care plan section) Acute Rehab PT Goals Patient Stated Goal: to return home and  recover from c diff PT Goal Formulation: With patient Time For Goal Achievement: 06/17/23 Potential to Achieve Goals: Good Progress towards PT goals: Progressing toward goals    Frequency    Min 1X/week       AM-PAC PT "6 Clicks" Mobility   Outcome Measure  Help needed turning from your back to your side while in a flat bed without using bedrails?: None Help needed moving from lying on your back to sitting on the side of a flat bed without using bedrails?: None Help needed moving to and from a bed to a chair (including a wheelchair)?: A Little Help needed standing up from a chair using your arms (e.g., wheelchair or bedside chair)?: A Little Help needed to walk in hospital room?: A Little Help needed climbing 3-5 steps with a railing? : A Little 6 Click Score: 20    End of Session Equipment Utilized During Treatment: Gait belt Activity Tolerance: Patient tolerated treatment well Patient left: in bed;with call bell/phone within reach Nurse Communication: Mobility status PT Visit Diagnosis: Unsteadiness on feet (R26.81);History of falling (Z91.81);Muscle weakness (generalized) (M62.81);Pain;Difficulty in walking, not elsewhere classified (R26.2) Pain - Right/Left: Right Pain - part of body: Shoulder     Time: 1610-9604 PT Time Calculation (min) (ACUTE ONLY): 28 min  Charges:    $Gait Training: 8-22 mins $Therapeutic Exercise: 8-22 mins PT General Charges $$ ACUTE PT VISIT: 1 Visit                     Vickki Muff, PT, DPT   Acute Rehabilitation Department Office [phone] Secure Chat Communication Preferred   Ronnie Derby 06/04/2023, 1:29 PM

## 2023-06-04 NOTE — Telephone Encounter (Signed)
Patient Product/process development scientist completed.    The patient is insured through [facility]. Patient has Medicare and is not eligible for a copay card, but may be able to apply for patient assistance, if available.    Ran test claim for Dificid 200 mg and the current 10 day co-pay is $1,636.12.   This test claim was processed through [facility]- copay amounts may vary at other pharmacies due to pharmacy/plan contracts, or as the patient moves through the different stages of their insurance plan.     Roland Earl, CPHT Pharmacy Technician III Certified Patient Advocate [facility] Patient Advocate Team Direct Number: [phone]  Fax: [phone]

## 2023-06-04 NOTE — Telephone Encounter (Signed)
Patient Advocate Encounter  Patient is approved through the [employer] Patient Assistance Program for Dificid through 07/02/2024.   Will be sent to patient's home   Roland Earl, CPhT Pharmacy Patient Advocate Specialist [facility] Patient Advocate Team Direct Number: [phone]  Fax: [phone]

## 2023-06-04 NOTE — TOC Initial Note (Signed)
Transition of Care [facility]) - Initial/Assessment Note    Patient Details  Name: Kimberly Beck MRN: [id number] Date of Birth: 24-May-1946  [facility]) CM/SW Contact:    Harriet Masson, RN Phone Number: 06/04/2023, 3:02 PM  Clinical Narrative:                 Spoke to patient regarding transition needs.  Patient lives with significant other and has BSC, walker.  Patient was active with [facility] and will need resumption Home health PT orders. Kandee Keen with [facility] accepted referral. Patient's SO's daughter can transport home when discharged.   Address(use temporary address) Phone number and PCP verified.  TOC will continue to follow for needs.  Expected Discharge Plan: Home w Home Health Services Barriers to Discharge: Continued Medical Work up   Patient Goals and CMS Choice Patient states their goals for this hospitalization and ongoing recovery are:: return home CMS Medicare.gov Compare Post Acute Care list provided to:: Patient Choice offered to / list presented to : Patient      Expected Discharge Plan and Services     Post Acute Care Choice: Home Health                                        Prior Living Arrangements/Services   Lives with:: Significant Other                   Activities of Daily Living   ADL Screening (condition at time of admission) Independently performs ADLs?: Yes (appropriate for developmental age) Is the patient deaf or have difficulty hearing?: No Does the patient have difficulty seeing, even when wearing glasses/contacts?: No Does the patient have difficulty concentrating, remembering, or making decisions?: No  Permission Sought/Granted                  Emotional Assessment              Admission diagnosis:  Dehydration [E86.0] Clostridium difficile colitis [A04.72] Nausea vomiting and diarrhea [R11.2, R19.7] C. difficile colitis [A04.72] Patient Active Problem List   Diagnosis  Date Noted   Clostridium difficile colitis 06/03/2023   Clostridioides difficile diarrhea 05/17/2023   Acute kidney injury superimposed on chronic kidney disease (HCC) 05/17/2023   Hypokalemia 05/17/2023   C. 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 [place] ([place] [address] Phone: [phone] Fax: [phone]  [facility] [address] [place] [address] Phone: [phone] Fax: [phone]     Social Determinants of Health (SDOH) Social History: SDOH Screenings   Food Insecurity: No Food Insecurity (06/03/2023)  Housing: Low Risk  (06/03/2023)  Transportation Needs: No Transportation Needs (06/03/2023)  Utilities: Not At Risk (06/03/2023)  Tobacco Use: Medium Risk (06/02/2023)   SDOH Interventions:     Readmission Risk Interventions     No data to display

## 2023-06-05 ENCOUNTER — Other Ambulatory Visit (HOSPITAL_COMMUNITY): Payer: Self-pay

## 2023-06-05 DIAGNOSIS — A0472 Enterocolitis due to Clostridium difficile, not specified as recurrent: Secondary | ICD-10-CM | POA: Diagnosis not present

## 2023-06-05 LAB — CBC
HCT: 32.6 % — ABNORMAL LOW (ref 36.0–46.0)
Hemoglobin: 10.4 g/dL — ABNORMAL LOW (ref 12.0–15.0)
MCH: 29.1 pg (ref 26.0–34.0)
MCHC: 31.9 g/dL (ref 30.0–36.0)
MCV: 91.1 fL (ref 80.0–100.0)
Platelets: 335 10*3/uL (ref 150–400)
RBC: 3.58 MIL/uL — ABNORMAL LOW (ref 3.87–5.11)
RDW: 15.4 % (ref 11.5–15.5)
WBC: 5.9 10*3/uL (ref 4.0–10.5)
nRBC: 0 % (ref 0.0–0.2)

## 2023-06-05 LAB — BASIC METABOLIC PANEL
Anion gap: 9 (ref 5–15)
BUN: 10 mg/dL (ref 8–23)
CO2: 21 mmol/L — ABNORMAL LOW (ref 22–32)
Calcium: 8.2 mg/dL — ABNORMAL LOW (ref 8.9–10.3)
Chloride: 109 mmol/L (ref 98–111)
Creatinine, Ser: 1.19 mg/dL — ABNORMAL HIGH (ref 0.44–1.00)
GFR, Estimated: 47 mL/min — ABNORMAL LOW (ref >60)
Glucose, Bld: 102 mg/dL — ABNORMAL HIGH (ref 70–99)
Potassium: 4 mmol/L (ref 3.5–5.1)
Sodium: 139 mmol/L (ref 135–145)

## 2023-06-05 LAB — MAGNESIUM: Magnesium: 2.1 mg/dL (ref 1.7–2.4)

## 2023-06-05 MED ORDER — ONDANSETRON 4 MG PO TBDP
4.0000 mg | ORAL_TABLET | Freq: Three times a day (TID) | ORAL | 0 refills | Status: DC | PRN
  Filled 2023-06-05: qty 15, 5d supply, fill #0

## 2023-06-05 MED ORDER — FIDAXOMICIN 200 MG PO TABS
200.0000 mg | ORAL_TABLET | Freq: Two times a day (BID) | ORAL | 0 refills | Status: AC
  Filled 2023-06-05: qty 14, 7d supply, fill #0

## 2023-06-05 NOTE — Plan of Care (Signed)
  Problem: Education: Goal: Knowledge of General Education information will improve Description: Including pain rating scale, medication(s)/side effects and non-pharmacologic comfort measures Outcome: Progressing   Problem: Clinical Measurements: Goal: Respiratory complications will improve Outcome: Progressing   Problem: Pain Management: Goal: General experience of comfort will improve Outcome: Progressing   Problem: Safety: Goal: Ability to remain free from injury will improve Outcome: Progressing   Problem: Skin Integrity: Goal: Risk for impaired skin integrity will decrease Outcome: Progressing

## 2023-06-05 NOTE — Hospital Course (Addendum)
Brief Narrative:   77 y.o. female past medical history significant for lung cancer in remission, essential hypertension, recently discharged in 05/17/2023 for C. difficile colitis on oral vancomycin, per patient she completed her course but the diarrhea did not resolve.  The ED potassium 3.1 CT scan of the abdomen pelvis showed slight improvement of persistent colitis.  Upon admission started on Dificid, doing significantly well today.  Will discharge her on Dificid, EOT 12/10.  Assessment & Plan:  Principal Problem:   Clostridium difficile colitis Active Problems:   HTN (hypertension)   Hyperlipidemia   C. difficile colitis     Clostridium difficile colitis: Recurrent versus incomplete clearance from recent episode.  Discontinued oral vancomycin now on Dificid. EOT 12/10.  Symptoms slowly appear to be improving.    Essential HTN (hypertension) Resume home medications.   Hypokalemia: Hypomagnesemia  as needed repletion   Hyperlipidemia Continue statins.   Normocytic anemia: No signs of overt bleeding follow-up with PCP as an outpatient.   Thrombocytosis: Likely reactive in the setting of active C. difficile now resolved.   Right humerus fracture Followed by ortho, plan is continue sling, f/u this month (scheduled)   DVT prophylaxis: lovenox Family Communication:none Status is: inpt Discharge today  Subjective: Doing significantly well.  Formed stools.  Denies any abdominal pain   Examination:  General exam: Appears calm and comfortable  Respiratory system: Clear to auscultation. Respiratory effort normal. Cardiovascular system: S1 & S2 heard, RRR. No JVD, murmurs, rubs, gallops or clicks. No pedal edema. Gastrointestinal system: Abdomen is nondistended, soft and nontender. No organomegaly or masses felt. Normal bowel sounds heard. Central nervous system: Alert and oriented. No focal neurological deficits. Extremities: Symmetric 5 x 5 power. Skin: No rashes,  lesions or ulcers Psychiatry: Judgement and insight appear normal. Mood & affect appropriate.

## 2023-06-05 NOTE — Consult Note (Signed)
[facility] Liaison Consult Note    06/05/2023  HELENANN VANBERGEN 04-19-1946 [id number]  Insurance: [employer]   Primary Care Provider: Jarrett Soho, Vernice Jefferson at [facility], this provider is listed for the transition of care follow up appointments  and with [facility]  [facility] calls   [facility] Liaison patient currently on Enteric Precaution, PPE reserved for inpatient staff.     The patient was screened for 30 day readmission hospitalization with noted high risk score for unplanned readmission risk 2 hospital admissions in 6 months.  The patient was assessed for potential Community Care Coordination service needs for post hospital transition for care coordination. Review of patient's electronic medical record reveals patient for new medications. Reviewed pharmacy note for medication assistance noted. For home with [facility].   Plan: [facility] Liaison will continue to follow progress and disposition to asess for post hospital community care coordination needs.  Referral request for community care coordination: will be followed by [facility] transition team and update can be sent.   [facility] Community Care, Population Health does not replace or interfere with any arrangements made by the Inpatient Transition of Care team.   For questions contact:   Charlesetta Shanks, RN, BSN, CCM [place]  [facility]  [facility] Liaison Direct Dial: [phone] or secure chat Email: [email or web address]

## 2023-06-05 NOTE — TOC Transition Note (Signed)
Transition of Care [facility]) - CM/SW Discharge Note   Patient Details  Name: Kimberly Beck MRN: [id number] Date of Birth: [birth year]  Transition of Care [facility]) CM/SW Contact:  Harriet Masson, RN Phone Number: 06/05/2023, 12:15 PM   Clinical Narrative:    Patient stable for discharge.  Notified Cory with [place] of discharge.  Address, Phone number and PCP verified.    Final next level of care: Home w Home Health Services Barriers to Discharge: Barriers Resolved   Patient Goals and CMS Choice CMS Medicare.gov Compare Post Acute Care list provided to:: Patient Choice offered to / list presented to : Patient  Discharge Placement                         Discharge Plan and Services Additional resources added to the After Visit Summary for       Post Acute Care Choice: Home Health                    HH Arranged: PT, OT, Nurse's Aide HH Agency: [facility] Date [facility] Agency Contacted: 06/05/23 Time HH Agency Contacted: [id number] Representative spoke with at [facility] Agency: Kandee Keen  Social Determinants of Health (SDOH) Interventions SDOH Screenings   Food Insecurity: No Food Insecurity (06/03/2023)  Housing: Low Risk  (06/03/2023)  Transportation Needs: No Transportation Needs (06/03/2023)  Utilities: Not At Risk (06/03/2023)  Tobacco Use: Medium Risk (06/02/2023)     Readmission Risk Interventions    06/05/2023   12:15 PM  Readmission Risk Prevention Plan  Transportation Screening Complete  PCP or Specialist Appt within 5-7 Days Complete  Home Care Screening Complete  Medication Review (RN CM) Complete

## 2023-06-05 NOTE — Discharge Summary (Signed)
Physician Discharge Summary  Kimberly Beck ZOX:[id number] DOB: 1945/12/30 DOA: 06/02/2023  PCP: Jarrett Soho, PA-C  Admit date: 06/02/2023 Discharge date: 06/05/2023  Admitted From: Home Disposition: Home  Recommendations for Outpatient Follow-up:  Follow up with PCP in 1-2 weeks Please obtain BMP/CBC in one week your next doctors visit.  Dificid twice daily, last day of treatment 12/10   Discharge Condition: Stable CODE STATUS: Full code Diet recommendation: Low-salt   Brief Narrative:   77 y.o. female past medical history significant for lung cancer in remission, essential hypertension, recently discharged in 05/17/2023 for C. difficile colitis on oral vancomycin, per patient she completed her course but the diarrhea did not resolve.  The ED potassium 3.1 CT scan of the abdomen pelvis showed slight improvement of persistent colitis.  Upon admission started on Dificid, doing significantly well today.  Will discharge her on Dificid, EOT 12/10.  Assessment & Plan:  Principal Problem:   Clostridium difficile colitis Active Problems:   HTN (hypertension)   Hyperlipidemia   C. difficile colitis     Clostridium difficile colitis: Recurrent versus incomplete clearance from recent episode.  Discontinued oral vancomycin now on Dificid. EOT 12/10.  Symptoms slowly appear to be improving.    Essential HTN (hypertension) Resume home medications.   Hypokalemia: Hypomagnesemia  as needed repletion   Hyperlipidemia Continue statins.   Normocytic anemia: No signs of overt bleeding follow-up with PCP as an outpatient.   Thrombocytosis: Likely reactive in the setting of active C. difficile now resolved.   Right humerus fracture Followed by ortho, plan is continue sling, f/u this month (scheduled)   DVT prophylaxis: lovenox Family Communication:none Status is: inpt Discharge today  Subjective: Doing significantly well.  Formed stools.  Denies any abdominal  pain   Examination:  General exam: Appears calm and comfortable  Respiratory system: Clear to auscultation. Respiratory effort normal. Cardiovascular system: S1 & S2 heard, RRR. No JVD, murmurs, rubs, gallops or clicks. No pedal edema. Gastrointestinal system: Abdomen is nondistended, soft and nontender. No organomegaly or masses felt. Normal bowel sounds heard. Central nervous system: Alert and oriented. No focal neurological deficits. Extremities: Symmetric 5 x 5 power. Skin: No rashes, lesions or ulcers Psychiatry: Judgement and insight appear normal. Mood & affect appropriate.    Discharge Diagnoses:  Principal Problem:   Clostridium difficile colitis Active Problems:   HTN (hypertension)   Hyperlipidemia   C. difficile colitis       Discharge Exam: Vitals:   06/05/23 0345 06/05/23 0749  BP: 136/64 (!) 139/91  Pulse: 79 81  Resp:  18  Temp: 98.7 F (37.1 C) 98.2 F (36.8 C)  SpO2: 99% 99%   Vitals:   06/04/23 2057 06/05/23 0345 06/05/23 0345 06/05/23 0749  BP: 113/76 136/64 136/64 (!) 139/91  Pulse: 79 78 79 81  Resp:    18  Temp: 98.7 F (37.1 C) 98.7 F (37.1 C) 98.7 F (37.1 C) 98.2 F (36.8 C)  TempSrc: Oral Oral Oral Oral  SpO2: 96% 99% 99% 99%  Weight:      Height:        Discharge Instructions   Allergies as of 06/05/2023       Reactions   Codeine Itching   Drunk feeling    Nsaids Other (See Comments)   Liver    Other    Other reaction(s): diarrhea and headache Other reaction(s): Global anesthesia Other reaction(s): edema   Penicillins Rash   Dry rash        Medication  List     TAKE these medications    albuterol 108 (90 Base) MCG/ACT inhaler Commonly known as: VENTOLIN HFA Inhale 2 puffs into the lungs every 6 (six) hours as needed for wheezing or shortness of breath.   aspirin EC 81 MG tablet Take 81 mg by mouth daily. Swallow whole.   Benadryl Allergy 25 MG tablet Generic drug: diphenhydrAMINE Take 25 mg by mouth at  bedtime as needed for sleep.   Biofreeze Cool The Pain 4 % Gel Generic drug: Menthol (Topical Analgesic) Apply 1 application  topically as needed.   calcium citrate 950 (200 Ca) MG tablet Commonly known as: CALCITRATE - dosed in mg elemental calcium Take 200 mg of elemental calcium by mouth daily.   escitalopram 10 MG tablet Commonly known as: LEXAPRO Take 10 mg by mouth daily.   famotidine 20 MG tablet Commonly known as: PEPCID Take 1 tablet (20 mg total) by mouth daily.   fidaxomicin 200 MG Tabs tablet Commonly known as: DIFICID Take 1 tablet (200 mg total) by mouth 2 (two) times daily for 7 days.   lidocaine 5 % Commonly known as: Lidoderm Place 1 patch onto the skin daily. Remove & Discard patch within 12 hours or as directed by MD   lisinopril-hydrochlorothiazide 20-25 MG tablet Commonly known as: ZESTORETIC Take 1 tablet by mouth daily.   metoprolol succinate 25 MG 24 hr tablet Commonly known as: TOPROL-XL TAKE 1 TABLET BY MOUTH DAILY   montelukast 10 MG tablet Commonly known as: SINGULAIR Take 10 mg by mouth daily.   Multivitamin Women 50+ Tabs Take 1 tablet by mouth daily.   ondansetron 4 MG disintegrating tablet Commonly known as: ZOFRAN-ODT Take 1 tablet (4 mg total) by mouth every 8 (eight) hours as needed for nausea or vomiting.   oxyCODONE 5 MG immediate release tablet Commonly known as: Oxy IR/ROXICODONE Take 5 mg by mouth every 6 (six) hours as needed.   rosuvastatin 5 MG tablet Commonly known as: CRESTOR Take 1 tablet by mouth daily.   TYLENOL 500 MG tablet Generic drug: acetaminophen Take 1,000 mg by mouth as needed for mild pain (pain score 1-3), moderate pain (pain score 4-6) or headache.   Vitamin D 50 MCG (2000 UT) Caps Take 2,000 Units by mouth daily.        Follow-up Information     Jarrett Soho, PA-C Follow up in 1 week(s).   Specialty: Family Medicine Contact information: [address] [place]  [address] [phone]         [facility] Follow up.   Specialty: Home Health Services Why: Home health has beena arranged.they will contact you to schedule apt within 48hrs post discharge. Contact information: [address] [place] [address] [phone]                Allergies  Allergen Reactions   Codeine Itching    Drunk feeling    Nsaids Other (See Comments)    Liver    Other     Other reaction(s): diarrhea and headache Other reaction(s): Global anesthesia Other reaction(s): edema   Penicillins Rash    Dry rash    You were cared for by a hospitalist during your hospital stay. If you have any questions about your discharge medications or the care you received while you were in the hospital after you are discharged, you can call the unit and asked to speak with the hospitalist on call if the hospitalist that took  care of you is not available. Once you are discharged, your primary care physician will handle any further medical issues. Please note that no refills for any discharge medications will be authorized once you are discharged, as it is imperative that you return to your primary care physician (or establish a relationship with a primary care physician if you do not have one) for your aftercare needs so that they can reassess your need for medications and monitor your lab values.  You were cared for by a hospitalist during your hospital stay. If you have any questions about your discharge medications or the care you received while you were in the hospital after you are discharged, you can call the unit and asked to speak with the hospitalist on call if the hospitalist that took care of you is not available. Once you are discharged, your primary care physician will handle any further medical issues. Please note that NO REFILLS for any discharge medications will be authorized once you are discharged, as it is imperative that you return to your  primary care physician (or establish a relationship with a primary care physician if you do not have one) for your aftercare needs so that they can reassess your need for medications and monitor your lab values.  Please request your Prim.MD to go over all Hospital Tests and Procedure/Radiological results at the follow up, please get all Hospital records sent to your Prim MD by signing hospital release before you go home.  Get CBC, CMP, 2 view Chest X ray checked  by Primary MD during your next visit or SNF MD in 5-7 days ( we routinely change or add medications that can affect your baseline labs and fluid status, therefore we recommend that you get the mentioned basic workup next visit with your PCP, your PCP may decide not to get them or add new tests based on their clinical decision)  On your next visit with your primary care physician please Get Medicines reviewed and adjusted.  If you experience worsening of your admission symptoms, develop shortness of breath, life threatening emergency, suicidal or homicidal thoughts you must seek medical attention immediately by calling 911 or calling your MD immediately  if symptoms less severe.  You Must read complete instructions/literature along with all the possible adverse reactions/side effects for all the Medicines you take and that have been prescribed to you. Take any new Medicines after you have completely understood and accpet all the possible adverse reactions/side effects.   Do not drive, operate heavy machinery, perform activities at heights, swimming or participation in water activities or provide baby sitting services if your were admitted for syncope or siezures until you have seen by Primary MD or a Neurologist and advised to do so again.  Do not drive when taking Pain medications.   Procedures/Studies: CT ABDOMEN PELVIS W CONTRAST  Result Date: 06/02/2023 CLINICAL DATA:  Abdominal pain, acute, nonlocalized L sided abd pain Nausea;  Diarrhea. Abdominal pain, acute, nonlocalized, L sided abd pain. Omni 350 65mL See ED Notes: 77 year old female with past medical history of recent C. difficile infection EXAM: CT ABDOMEN AND PELVIS WITH CONTRAST TECHNIQUE: Multidetector CT imaging of the abdomen and pelvis was performed using the standard protocol following bolus administration of intravenous contrast. RADIATION DOSE REDUCTION: This exam was performed according to the departmental dose-optimization program which includes automated exposure control, adjustment of the mA and/or kV according to patient size and/or use of iterative reconstruction technique. CONTRAST:  65mL OMNIPAQUE IOHEXOL 350 MG/ML  SOLN COMPARISON:  CT lumbar spine 05/17/2023. FINDINGS: Lower chest: At least small volume hiatal hernia. Hepatobiliary: Fluid density lesions likely represent simple renal cysts. No gallstones, gallbladder wall thickening, or pericholecystic fluid. No biliary dilatation. Pancreas: No focal lesion. Normal pancreatic contour. No surrounding inflammatory changes. No main pancreatic ductal dilatation. Spleen: Normal in size without focal abnormality. Adrenals/Urinary Tract: No adrenal nodule bilaterally. Bilateral kidneys enhance symmetrically. No hydronephrosis. No hydroureter. The urinary bladder is unremarkable. Stomach/Bowel: Stomach is within normal limits. No evidence of small bowel wall thickening or dilatation. Under distended descending colon demonstrates lack of haustration with slightly improved but persistent bowel wall thickening and suggestion of mucosal hyperemia. Colonic diverticulosis. Appendix appears normal. Vascular/Lymphatic: no abdominal aorta or iliac aneurysm. At least mild atherosclerotic plaque of the aorta and its branches. No abdominal, pelvic, or inguinal lymphadenopathy. Reproductive: Uterus and bilateral adnexa are unremarkable. Other: No intraperitoneal free fluid. No intraperitoneal free gas. No organized fluid collection.  Musculoskeletal: No abdominal wall hernia or abnormality. No suspicious lytic or blastic osseous lesions. No acute displaced fracture. Chronic stable T12 and L1 compression fractures. IMPRESSION: 1. Slightly improved but persistent descending colitis. 2. Colonic diverticulosis with no acute diverticulitis. 3. At least small volume hiatal hernia. Electronically Signed   By: Tish Frederickson M.D.   On: 06/02/2023 23:00   CT PELVIS WO CONTRAST  Result Date: 05/17/2023 CLINICAL DATA:  Fall 3 days ago with worsening pain. EXAM: CT PELVIS WITHOUT CONTRAST TECHNIQUE: Multidetector CT imaging of the pelvis was performed following the standard protocol without intravenous contrast. RADIATION DOSE REDUCTION: This exam was performed according to the departmental dose-optimization program which includes automated exposure control, adjustment of the mA and/or kV according to patient size and/or use of iterative reconstruction technique. COMPARISON:  PET CT 03/02/2023 FINDINGS: Urinary Tract:  Unremarkable Bowel: Partially covered fat stranding surrounds the colon which is likely thick walled. Colonic fluid is seen at the level of the transverse colon. Multiple descending colonic diverticula. No obstructive changes. Vascular/Lymphatic: Atheromatous calcification of the aorta and iliacs. Reproductive:  Unremarkable for age. Other:  No ascites or pneumoperitoneum. Musculoskeletal: No acute fracture. No hip or pelvic ring dislocation or diastasis. No significant hip degeneration. Subjective osteopenia. IMPRESSION: 1. No acute finding in the pelvis and hips. 2. Descending colitis or diverticulitis Electronically Signed   By: Tiburcio Pea M.D.   On: 05/17/2023 04:16   CT Lumbar Spine Wo Contrast  Result Date: 05/17/2023 CLINICAL DATA:  Fall 3 days ago with worsening pain and leg numbness. EXAM: CT LUMBAR SPINE WITHOUT CONTRAST TECHNIQUE: Multidetector CT imaging of the lumbar spine was performed without intravenous  contrast administration. Multiplanar CT image reconstructions were also generated. RADIATION DOSE REDUCTION: This exam was performed according to the departmental dose-optimization program which includes automated exposure control, adjustment of the mA and/or kV according to patient size and/or use of iterative reconstruction technique. COMPARISON:  12/12/2007 FINDINGS: Segmentation: 5 lumbar type vertebrae. Alignment: No traumatic malalignment Vertebrae: Subjective generalized osteopenia. Remote L1 compression fracture with mild retropulsion, healed. No evidence of aggressive bone lesion Paraspinal and other soft tissues: Partial coverage of the descending colon shows apparent submucosal low-density thickening and adjacent fat stranding. Disc levels: Disc space narrowing and endplate ridging at T12-L1 and L1-2. Degenerative facet spurring especially at L4-5 and below. Moderate bilateral foraminal narrowing at L2-3 due to disc height loss and bulging primarily. Mild thecal sac stenosis at the same level. Mild to moderate thecal sac stenosis at L1 due to the longstanding retropulsion. IMPRESSION:  1. Partially covered thickening of the descending colon with inflammatory features suggesting colitis or diverticulitis, correlate with abdominal symptoms. 2. No acute finding in the lumbar spine. 3. Remote L1 compression fracture with healed retropulsion causing mild to moderate thecal sac stenosis. Electronically Signed   By: Tiburcio Pea M.D.   On: 05/17/2023 04:14   CT Head Wo Contrast  Result Date: 05/17/2023 CLINICAL DATA:  Head trauma fell 3 days ago with recent worsening pain and leg numbness. EXAM: CT HEAD WITHOUT CONTRAST TECHNIQUE: Contiguous axial images were obtained from the base of the skull through the vertex without intravenous contrast. RADIATION DOSE REDUCTION: This exam was performed according to the departmental dose-optimization program which includes automated exposure control, adjustment of  the mA and/or kV according to patient size and/or use of iterative reconstruction technique. COMPARISON:  CT head 05/14/2023 FINDINGS: Brain: No intracranial hemorrhage, mass effect, or evidence of acute infarct. No hydrocephalus. No extra-axial fluid collection. Vascular: No hyperdense vessel or unexpected calcification. Skull: No fracture or focal lesion. Sinuses/Orbits: No acute finding. Other: None. IMPRESSION: No acute intracranial abnormality. Electronically Signed   By: Minerva Fester M.D.   On: 05/17/2023 03:48   DG Chest Portable 1 View  Result Date: 05/17/2023 CLINICAL DATA:  Weakness with nausea, vomiting and diarrhea. EXAM: PORTABLE CHEST 1 VIEW COMPARISON:  July 29, 2020 FINDINGS: The heart size and mediastinal contours are within normal limits. There is no evidence of an acute infiltrate, pleural effusion or pneumothorax. Multilevel degenerative changes are seen throughout the thoracic spine. IMPRESSION: No active cardiopulmonary disease. Electronically Signed   By: Aram Candela M.D.   On: 05/17/2023 02:31   DG Knee Complete 4 Views Right  Result Date: 05/14/2023 CLINICAL DATA:  Status post fall. EXAM: RIGHT KNEE - COMPLETE 4+ VIEW COMPARISON:  None Available. FINDINGS: No evidence of fracture, dislocation, or joint effusion. No evidence of arthropathy or other focal bone abnormality. An ill-defined linear soft tissue deformity is seen along the anteromedial aspect of the right knee. IMPRESSION: No acute osseous abnormality. Electronically Signed   By: Aram Candela M.D.   On: 05/14/2023 23:40   DG Humerus Right  Result Date: 05/14/2023 CLINICAL DATA:  Status post fall. EXAM: RIGHT HUMERUS - 2+ VIEW COMPARISON:  None Available. FINDINGS: An acute fracture deformity is seen involving the head and neck of the proximal right humerus. There is no evidence of dislocation. Soft tissues are unremarkable. IMPRESSION: Acute fracture of the proximal right humerus. Electronically  Signed   By: Aram Candela M.D.   On: 05/14/2023 23:37   DG Shoulder Right  Result Date: 05/14/2023 CLINICAL DATA:  Status post fall. EXAM: RIGHT SHOULDER - 2+ VIEW COMPARISON:  None Available. FINDINGS: An acute fracture deformity is seen extending through the head and neck of the proximal right humerus. There is no evidence of dislocation. There is no evidence of arthropathy or other focal bone abnormality. Soft tissues are unremarkable. IMPRESSION: Acute fracture of the proximal right humerus. Electronically Signed   By: Aram Candela M.D.   On: 05/14/2023 23:36   CT Cervical Spine Wo Contrast  Result Date: 05/14/2023 CLINICAL DATA:  Status post trauma. EXAM: CT CERVICAL SPINE WITHOUT CONTRAST TECHNIQUE: Multidetector CT imaging of the cervical spine was performed without intravenous contrast. Multiplanar CT image reconstructions were also generated. RADIATION DOSE REDUCTION: This exam was performed according to the departmental dose-optimization program which includes automated exposure control, adjustment of the mA and/or kV according to patient size and/or use of iterative  reconstruction technique. COMPARISON:  None Available. FINDINGS: Alignment: Normal. Skull base and vertebrae: No acute fracture. No primary bone lesion or focal pathologic process. Chronic and degenerative changes are seen involving the body and tip of the dens, as well as the adjacent portion of the anterior arch of C1. Soft tissues and spinal canal: No prevertebral fluid or swelling. No visible canal hematoma. Disc levels: Mild anterior osteophyte formation is seen at the levels of C3-C4, C4-C5, C5-C6 and C6-C7. Mild to moderate severity posterior bony spurring is seen at the level of C5-C6. Marked severity posterior intervertebral disc space narrowing is seen at the levels of C5-C6 and C6-C7. Bilateral moderate to marked severity multilevel facet joint hypertrophy is noted. Upper chest: Mild to moderate severity biapical  scarring and/or atelectasis is seen. Other: Ill-defined thyroid nodules are seen within the right and left lobes of the thyroid gland. IMPRESSION: 1. No acute cervical spine fracture or subluxation. 2. Marked severity degenerative changes at the levels of C5-C6 and C6-C7. 3. Ill-defined thyroid nodules. Correlation with nonemergent thyroid gland ultrasound is recommended. Electronically Signed   By: Aram Candela M.D.   On: 05/14/2023 23:36   CT Head Wo Contrast  Result Date: 05/14/2023 CLINICAL DATA:  Status post trauma. EXAM: CT HEAD WITHOUT CONTRAST TECHNIQUE: Contiguous axial images were obtained from the base of the skull through the vertex without intravenous contrast. RADIATION DOSE REDUCTION: This exam was performed according to the departmental dose-optimization program which includes automated exposure control, adjustment of the mA and/or kV according to patient size and/or use of iterative reconstruction technique. COMPARISON:  April 12, 2004 FINDINGS: Brain: There is mild cerebral atrophy with widening of the extra-axial spaces and ventricular dilatation. There are areas of decreased attenuation within the white matter tracts of the supratentorial brain, consistent with microvascular disease changes. 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 Vascular: Marked severity bilateral cavernous carotid artery calcification is seen. Skull: Normal. Negative for fracture or focal lesion. Sinuses/Orbits: No acute finding. Other: None. IMPRESSION: 1. Generalized cerebral atrophy with chronic white matter small vessel ischemic changes. 2. 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 3. No acute intracranial abnormality. Electronically Signed   By: Aram Candela M.D.   On: 05/14/2023 23:32     The results of significant diagnostics from this hospitalization (including imaging, microbiology, ancillary and laboratory) are listed below for reference.     Microbiology: No  results found for this or any previous visit (from the past 240 hour(s)).   Labs: BNP (last 3 results) No results for input(s): "BNP" in the last 8760 hours. Basic Metabolic Panel: Recent Labs  Lab 06/02/23 1948 06/03/23 0536 06/04/23 1107 06/05/23 0529  NA 140 142 141 139  K 3.1* 3.0* 4.2 4.0  CL 104 110 112* 109  CO2 21* 21* 20* 21*  GLUCOSE 142* 98 140* 102*  BUN 12 10 8 10   CREATININE 0.96 0.84 0.83 1.19*  CALCIUM 9.9 8.2* 8.3* 8.2*  MG  --  1.4* 1.7 2.1   Liver Function Tests: Recent Labs  Lab 06/02/23 1948  AST 27  ALT 14  ALKPHOS 119  BILITOT 1.4*  PROT 6.5  ALBUMIN 3.5   Recent Labs  Lab 06/02/23 1948  LIPASE 34   No results for input(s): "AMMONIA" in the last 168 hours. CBC: Recent Labs  Lab 06/02/23 1948 06/03/23 0536 06/05/23 0529  WBC 9.0 6.7 5.9  NEUTROABS 6.1 4.2  --   HGB 13.5 11.1* 10.4*  HCT 42.2  34.9* 32.6*  MCV 90.2 92.3 91.1  PLT 432* 348 335   Cardiac Enzymes: No results for input(s): "CKTOTAL", "CKMB", "CKMBINDEX", "TROPONINI" in the last 168 hours. BNP: Invalid input(s): "POCBNP" CBG: No results for input(s): "GLUCAP" in the last 168 hours. D-Dimer No results for input(s): "DDIMER" in the last 72 hours. Hgb A1c No results for input(s): "HGBA1C" in the last 72 hours. Lipid Profile No results for input(s): "CHOL", "HDL", "LDLCALC", "TRIG", "CHOLHDL", "LDLDIRECT" in the last 72 hours. Thyroid function studies No results for input(s): "TSH", "T4TOTAL", "T3FREE", "THYROIDAB" in the last 72 hours.  Invalid input(s): "FREET3" Anemia work up No results for input(s): "VITAMINB12", "FOLATE", "FERRITIN", "TIBC", "IRON", "RETICCTPCT" in the last 72 hours. Urinalysis    Component Value Date/Time   COLORURINE YELLOW 06/03/2023 0047   APPEARANCEUR CLEAR 06/03/2023 0047   LABSPEC 1.039 (H) 06/03/2023 0047   PHURINE 7.0 06/03/2023 0047   GLUCOSEU NEGATIVE 06/03/2023 0047   HGBUR SMALL (A) 06/03/2023 0047   BILIRUBINUR NEGATIVE  06/03/2023 0047   KETONESUR 5 (A) 06/03/2023 0047   PROTEINUR NEGATIVE 06/03/2023 0047   NITRITE NEGATIVE 06/03/2023 0047   LEUKOCYTESUR NEGATIVE 06/03/2023 0047   Sepsis Labs Recent Labs  Lab 06/02/23 1948 06/03/23 0536 06/05/23 0529  WBC 9.0 6.7 5.9   Microbiology No results found for this or any previous visit (from the past 240 hour(s)).   Time coordinating discharge:  I have spent 35 minutes face to face with the patient and on the ward discussing the patients care, assessment, plan and disposition with other care givers. >50% of the time was devoted counseling the patient about the risks and benefits of treatment/Discharge disposition and coordinating care.   SIGNED:   Miguel Rota, MD  [facility] 06/05/2023, 2:49 PM   If 7PM-7AM, please contact night-coverage

## 2023-06-06 DIAGNOSIS — G8929 Other chronic pain: Secondary | ICD-10-CM | POA: Diagnosis not present

## 2023-06-06 DIAGNOSIS — S42201A Unspecified fracture of upper end of right humerus, initial encounter for closed fracture: Secondary | ICD-10-CM | POA: Diagnosis not present

## 2023-06-06 DIAGNOSIS — E876 Hypokalemia: Secondary | ICD-10-CM | POA: Diagnosis not present

## 2023-06-06 DIAGNOSIS — N189 Chronic kidney disease, unspecified: Secondary | ICD-10-CM | POA: Diagnosis not present

## 2023-06-06 DIAGNOSIS — D631 Anemia in chronic kidney disease: Secondary | ICD-10-CM | POA: Diagnosis not present

## 2023-06-06 DIAGNOSIS — M545 Low back pain, unspecified: Secondary | ICD-10-CM | POA: Diagnosis not present

## 2023-06-06 DIAGNOSIS — N179 Acute kidney failure, unspecified: Secondary | ICD-10-CM | POA: Diagnosis not present

## 2023-06-06 DIAGNOSIS — I129 Hypertensive chronic kidney disease with stage 1 through stage 4 chronic kidney disease, or unspecified chronic kidney disease: Secondary | ICD-10-CM | POA: Diagnosis not present

## 2023-06-06 DIAGNOSIS — E7800 Pure hypercholesterolemia, unspecified: Secondary | ICD-10-CM | POA: Diagnosis not present

## 2023-06-07 DIAGNOSIS — D631 Anemia in chronic kidney disease: Secondary | ICD-10-CM | POA: Diagnosis not present

## 2023-06-07 DIAGNOSIS — S42201A Unspecified fracture of upper end of right humerus, initial encounter for closed fracture: Secondary | ICD-10-CM | POA: Diagnosis not present

## 2023-06-07 DIAGNOSIS — E7800 Pure hypercholesterolemia, unspecified: Secondary | ICD-10-CM | POA: Diagnosis not present

## 2023-06-07 DIAGNOSIS — I129 Hypertensive chronic kidney disease with stage 1 through stage 4 chronic kidney disease, or unspecified chronic kidney disease: Secondary | ICD-10-CM | POA: Diagnosis not present

## 2023-06-07 DIAGNOSIS — N189 Chronic kidney disease, unspecified: Secondary | ICD-10-CM | POA: Diagnosis not present

## 2023-06-12 DIAGNOSIS — N189 Chronic kidney disease, unspecified: Secondary | ICD-10-CM | POA: Diagnosis not present

## 2023-06-12 DIAGNOSIS — S42201A Unspecified fracture of upper end of right humerus, initial encounter for closed fracture: Secondary | ICD-10-CM | POA: Diagnosis not present

## 2023-06-12 DIAGNOSIS — E7800 Pure hypercholesterolemia, unspecified: Secondary | ICD-10-CM | POA: Diagnosis not present

## 2023-06-12 DIAGNOSIS — D631 Anemia in chronic kidney disease: Secondary | ICD-10-CM | POA: Diagnosis not present

## 2023-06-12 DIAGNOSIS — I129 Hypertensive chronic kidney disease with stage 1 through stage 4 chronic kidney disease, or unspecified chronic kidney disease: Secondary | ICD-10-CM | POA: Diagnosis not present

## 2023-06-15 DIAGNOSIS — M545 Low back pain, unspecified: Secondary | ICD-10-CM | POA: Diagnosis not present

## 2023-06-15 DIAGNOSIS — S42201D Unspecified fracture of upper end of right humerus, subsequent encounter for fracture with routine healing: Secondary | ICD-10-CM | POA: Diagnosis not present

## 2023-06-15 DIAGNOSIS — S39012A Strain of muscle, fascia and tendon of lower back, initial encounter: Secondary | ICD-10-CM | POA: Diagnosis not present

## 2023-06-18 ENCOUNTER — Other Ambulatory Visit (HOSPITAL_COMMUNITY): Payer: Self-pay | Admitting: Orthopedic Surgery

## 2023-06-18 DIAGNOSIS — M545 Low back pain, unspecified: Secondary | ICD-10-CM

## 2023-06-19 ENCOUNTER — Emergency Department (HOSPITAL_COMMUNITY): Payer: Medicare Other

## 2023-06-19 ENCOUNTER — Inpatient Hospital Stay (HOSPITAL_COMMUNITY)
Admission: EM | Admit: 2023-06-19 | Discharge: 2023-06-26 | DRG: 871 | Disposition: A | Discharge status: Home / Self Care | Payer: Medicare Other | Attending: Internal Medicine | Admitting: Internal Medicine

## 2023-06-19 ENCOUNTER — Other Ambulatory Visit: Payer: Self-pay

## 2023-06-19 ENCOUNTER — Encounter (HOSPITAL_COMMUNITY): Payer: Self-pay

## 2023-06-19 DIAGNOSIS — I499 Cardiac arrhythmia, unspecified: Secondary | ICD-10-CM | POA: Diagnosis not present

## 2023-06-19 DIAGNOSIS — Z823 Family history of stroke: Secondary | ICD-10-CM | POA: Diagnosis not present

## 2023-06-19 DIAGNOSIS — Y95 Nosocomial condition: Secondary | ICD-10-CM | POA: Diagnosis present

## 2023-06-19 DIAGNOSIS — K529 Noninfective gastroenteritis and colitis, unspecified: Secondary | ICD-10-CM | POA: Diagnosis not present

## 2023-06-19 DIAGNOSIS — E876 Hypokalemia: Secondary | ICD-10-CM | POA: Diagnosis present

## 2023-06-19 DIAGNOSIS — A0472 Enterocolitis due to Clostridium difficile, not specified as recurrent: Secondary | ICD-10-CM | POA: Diagnosis not present

## 2023-06-19 DIAGNOSIS — N179 Acute kidney failure, unspecified: Secondary | ICD-10-CM | POA: Diagnosis present

## 2023-06-19 DIAGNOSIS — Z8619 Personal history of other infectious and parasitic diseases: Secondary | ICD-10-CM

## 2023-06-19 DIAGNOSIS — E861 Hypovolemia: Secondary | ICD-10-CM | POA: Diagnosis not present

## 2023-06-19 DIAGNOSIS — R000 Tachycardia, unspecified: Secondary | ICD-10-CM | POA: Diagnosis present

## 2023-06-19 DIAGNOSIS — N182 Chronic kidney disease, stage 2 (mild): Secondary | ICD-10-CM | POA: Diagnosis not present

## 2023-06-19 DIAGNOSIS — J189 Pneumonia, unspecified organism: Secondary | ICD-10-CM | POA: Diagnosis present

## 2023-06-19 DIAGNOSIS — D72829 Elevated white blood cell count, unspecified: Secondary | ICD-10-CM | POA: Diagnosis not present

## 2023-06-19 DIAGNOSIS — Z1152 Encounter for screening for COVID-19: Secondary | ICD-10-CM | POA: Diagnosis not present

## 2023-06-19 DIAGNOSIS — Z79899 Other long term (current) drug therapy: Secondary | ICD-10-CM

## 2023-06-19 DIAGNOSIS — Z880 Allergy status to penicillin: Secondary | ICD-10-CM

## 2023-06-19 DIAGNOSIS — Z86718 Personal history of other venous thrombosis and embolism: Secondary | ICD-10-CM | POA: Diagnosis not present

## 2023-06-19 DIAGNOSIS — N189 Chronic kidney disease, unspecified: Secondary | ICD-10-CM | POA: Diagnosis not present

## 2023-06-19 DIAGNOSIS — Z87891 Personal history of nicotine dependence: Secondary | ICD-10-CM | POA: Diagnosis not present

## 2023-06-19 DIAGNOSIS — A419 Sepsis, unspecified organism: Principal | ICD-10-CM | POA: Diagnosis present

## 2023-06-19 DIAGNOSIS — R112 Nausea with vomiting, unspecified: Secondary | ICD-10-CM | POA: Diagnosis not present

## 2023-06-19 DIAGNOSIS — K449 Diaphragmatic hernia without obstruction or gangrene: Secondary | ICD-10-CM | POA: Diagnosis not present

## 2023-06-19 DIAGNOSIS — Z7982 Long term (current) use of aspirin: Secondary | ICD-10-CM

## 2023-06-19 DIAGNOSIS — Z8249 Family history of ischemic heart disease and other diseases of the circulatory system: Secondary | ICD-10-CM | POA: Diagnosis not present

## 2023-06-19 DIAGNOSIS — Z888 Allergy status to other drugs, medicaments and biological substances status: Secondary | ICD-10-CM | POA: Diagnosis not present

## 2023-06-19 DIAGNOSIS — R109 Unspecified abdominal pain: Principal | ICD-10-CM

## 2023-06-19 DIAGNOSIS — R1084 Generalized abdominal pain: Secondary | ICD-10-CM | POA: Diagnosis not present

## 2023-06-19 DIAGNOSIS — I700 Atherosclerosis of aorta: Secondary | ICD-10-CM | POA: Diagnosis not present

## 2023-06-19 DIAGNOSIS — Z886 Allergy status to analgesic agent status: Secondary | ICD-10-CM | POA: Diagnosis not present

## 2023-06-19 DIAGNOSIS — Z85118 Personal history of other malignant neoplasm of bronchus and lung: Secondary | ICD-10-CM | POA: Diagnosis not present

## 2023-06-19 DIAGNOSIS — I129 Hypertensive chronic kidney disease with stage 1 through stage 4 chronic kidney disease, or unspecified chronic kidney disease: Secondary | ICD-10-CM | POA: Diagnosis not present

## 2023-06-19 DIAGNOSIS — I10 Essential (primary) hypertension: Secondary | ICD-10-CM | POA: Diagnosis present

## 2023-06-19 DIAGNOSIS — R110 Nausea: Secondary | ICD-10-CM | POA: Diagnosis not present

## 2023-06-19 DIAGNOSIS — K5730 Diverticulosis of large intestine without perforation or abscess without bleeding: Secondary | ICD-10-CM | POA: Diagnosis not present

## 2023-06-19 DIAGNOSIS — Z885 Allergy status to narcotic agent status: Secondary | ICD-10-CM | POA: Diagnosis not present

## 2023-06-19 DIAGNOSIS — R918 Other nonspecific abnormal finding of lung field: Secondary | ICD-10-CM | POA: Diagnosis not present

## 2023-06-19 DIAGNOSIS — Z743 Need for continuous supervision: Secondary | ICD-10-CM | POA: Diagnosis not present

## 2023-06-19 LAB — COMPREHENSIVE METABOLIC PANEL
ALT: 9 U/L (ref 0–44)
AST: 16 U/L (ref 15–41)
Albumin: 3 g/dL — ABNORMAL LOW (ref 3.5–5.0)
Alkaline Phosphatase: 106 U/L (ref 38–126)
Anion gap: 18 — ABNORMAL HIGH (ref 5–15)
BUN: 15 mg/dL (ref 8–23)
CO2: 22 mmol/L (ref 22–32)
Calcium: 8.9 mg/dL (ref 8.9–10.3)
Chloride: 98 mmol/L (ref 98–111)
Creatinine, Ser: 1.05 mg/dL — ABNORMAL HIGH (ref 0.44–1.00)
GFR, Estimated: 55 mL/min — ABNORMAL LOW (ref >60)
Glucose, Bld: 152 mg/dL — ABNORMAL HIGH (ref 70–99)
Potassium: 2.9 mmol/L — ABNORMAL LOW (ref 3.5–5.1)
Sodium: 138 mmol/L (ref 135–145)
Total Bilirubin: 1.6 mg/dL — ABNORMAL HIGH (ref <1.2)
Total Protein: 6.7 g/dL (ref 6.5–8.1)

## 2023-06-19 LAB — CBC
HCT: 40.8 % (ref 36.0–46.0)
HCT: 35.7 % — ABNORMAL LOW (ref 36.0–46.0)
Hemoglobin: 13.1 g/dL (ref 12.0–15.0)
Hemoglobin: 11.3 g/dL — ABNORMAL LOW (ref 12.0–15.0)
MCH: 28.9 pg (ref 26.0–34.0)
MCH: 29 pg (ref 26.0–34.0)
MCHC: 32.1 g/dL (ref 30.0–36.0)
MCHC: 31.7 g/dL (ref 30.0–36.0)
MCV: 90.1 fL (ref 80.0–100.0)
MCV: 91.5 fL (ref 80.0–100.0)
Platelets: 254 10*3/uL (ref 150–400)
Platelets: 204 10*3/uL (ref 150–400)
RBC: 4.53 MIL/uL (ref 3.87–5.11)
RBC: 3.9 MIL/uL (ref 3.87–5.11)
RDW: 14.9 % (ref 11.5–15.5)
RDW: 15 % (ref 11.5–15.5)
WBC: 19.1 10*3/uL — ABNORMAL HIGH (ref 4.0–10.5)
WBC: 15.8 10*3/uL — ABNORMAL HIGH (ref 4.0–10.5)
nRBC: 0 % (ref 0.0–0.2)
nRBC: 0 % (ref 0.0–0.2)

## 2023-06-19 LAB — RESP PANEL BY RT-PCR (RSV, FLU A&B, COVID)  RVPGX2
Influenza A by PCR: NEGATIVE
Influenza B by PCR: NEGATIVE
Resp Syncytial Virus by PCR: NEGATIVE
SARS Coronavirus 2 by RT PCR: NEGATIVE

## 2023-06-19 LAB — CREATININE, SERUM
Creatinine, Ser: 0.94 mg/dL (ref 0.44–1.00)
GFR, Estimated: >60 mL/min (ref >60)

## 2023-06-19 LAB — LIPASE, BLOOD: Lipase: 25 U/L (ref 11–51)

## 2023-06-19 LAB — TROPONIN I (HIGH SENSITIVITY)
Troponin I (High Sensitivity): 14 ng/L (ref <18)
Troponin I (High Sensitivity): 10 ng/L (ref <18)
Troponin I (High Sensitivity): 14 ng/L (ref <18)

## 2023-06-19 MED ORDER — ONDANSETRON 4 MG PO TBDP
4.0000 mg | ORAL_TABLET | Freq: Once | ORAL | Status: AC | PRN
  Administered 2023-06-19: 4 mg via ORAL
  Filled 2023-06-19: qty 1

## 2023-06-19 MED ORDER — LORAZEPAM 2 MG/ML IJ SOLN
1.0000 mg | Freq: Once | INTRAMUSCULAR | Status: AC
Start: 2023-06-19 — End: 2023-06-19
  Administered 2023-06-19: 1 mg via INTRAVENOUS
  Filled 2023-06-19: qty 1

## 2023-06-19 MED ORDER — SODIUM CHLORIDE 0.9 % IV BOLUS
1000.0000 mL | Freq: Once | INTRAVENOUS | Status: AC
  Administered 2023-06-19: 1000 mL via INTRAVENOUS

## 2023-06-19 MED ORDER — ENOXAPARIN SODIUM 40 MG/0.4ML IJ SOSY
40.0000 mg | PREFILLED_SYRINGE | INTRAMUSCULAR | Status: DC
  Administered 2023-06-19 – 2023-06-25 (×7): 40 mg via SUBCUTANEOUS
  Filled 2023-06-19 (×7): qty 0.4

## 2023-06-19 MED ORDER — POTASSIUM CHLORIDE 10 MEQ/100ML IV SOLN
10.0000 meq | Freq: Once | INTRAVENOUS | Status: AC
  Administered 2023-06-19: 10 meq via INTRAVENOUS
  Filled 2023-06-19 (×2): qty 100

## 2023-06-19 MED ORDER — METOPROLOL SUCCINATE ER 25 MG PO TB24
25.0000 mg | ORAL_TABLET | Freq: Every day | ORAL | Status: DC
  Administered 2023-06-20 – 2023-06-26 (×7): 25 mg via ORAL
  Filled 2023-06-19 (×9): qty 1

## 2023-06-19 MED ORDER — FIDAXOMICIN 200 MG PO TABS: 200.0000 mg | ORAL_TABLET | Freq: Two times a day (BID) | ORAL | Status: DC

## 2023-06-19 MED ORDER — CEFTRIAXONE SODIUM 2 G IJ SOLR
2.0000 g | Freq: Once | INTRAMUSCULAR | Status: AC
  Administered 2023-06-19: 2 g via INTRAVENOUS
  Filled 2023-06-19 (×2): qty 20

## 2023-06-19 MED ORDER — SODIUM CHLORIDE 0.9 % IV SOLN: 500.0000 mg | Freq: Once | INTRAVENOUS | Status: DC

## 2023-06-19 MED ORDER — DIPHENHYDRAMINE HCL 25 MG PO TABS: 25.0000 mg | ORAL_TABLET | Freq: Every day | ORAL | Status: DC | PRN

## 2023-06-19 MED ORDER — FAMOTIDINE 20 MG PO TABS
20.0000 mg | ORAL_TABLET | Freq: Every day | ORAL | Status: DC
  Administered 2023-06-19: 20 mg via ORAL
  Filled 2023-06-19 (×2): qty 1

## 2023-06-19 MED ORDER — ONDANSETRON HCL 4 MG PO TABS: 4.0000 mg | ORAL_TABLET | Freq: Four times a day (QID) | ORAL | Status: DC | PRN

## 2023-06-19 MED ORDER — SODIUM CHLORIDE 0.9 % IV SOLN: 1.0000 g | INTRAVENOUS | Status: DC

## 2023-06-19 MED ORDER — ONDANSETRON HCL 4 MG/2ML IJ SOLN: 4.0000 mg | Freq: Once | INTRAMUSCULAR | Status: DC

## 2023-06-19 MED ORDER — ROSUVASTATIN CALCIUM 5 MG PO TABS
5.0000 mg | ORAL_TABLET | Freq: Every day | ORAL | Status: DC
  Administered 2023-06-19 – 2023-06-26 (×8): 5 mg via ORAL
  Filled 2023-06-19 (×9): qty 1

## 2023-06-19 MED ORDER — ASPIRIN 81 MG PO TBEC
81.0000 mg | DELAYED_RELEASE_TABLET | Freq: Every day | ORAL | Status: DC
  Administered 2023-06-19 – 2023-06-26 (×8): 81 mg via ORAL
  Filled 2023-06-19 (×9): qty 1

## 2023-06-19 MED ORDER — IOHEXOL 350 MG/ML SOLN
75.0000 mL | Freq: Once | INTRAVENOUS | Status: AC | PRN
  Administered 2023-06-19: 75 mL via INTRAVENOUS

## 2023-06-19 MED ORDER — ESCITALOPRAM OXALATE 10 MG PO TABS
10.0000 mg | ORAL_TABLET | Freq: Every day | ORAL | Status: DC
  Administered 2023-06-19 – 2023-06-26 (×8): 10 mg via ORAL
  Filled 2023-06-19 (×8): qty 1

## 2023-06-19 MED ORDER — ALBUTEROL SULFATE HFA 108 (90 BASE) MCG/ACT IN AERS: 2.0000 | INHALATION_SPRAY | Freq: Four times a day (QID) | RESPIRATORY_TRACT | Status: DC | PRN

## 2023-06-19 MED ORDER — FENTANYL CITRATE PF 50 MCG/ML IJ SOSY: 12.5000 ug | PREFILLED_SYRINGE | INTRAMUSCULAR | Status: DC | PRN

## 2023-06-19 MED ORDER — POTASSIUM CHLORIDE 10 MEQ/100ML IV SOLN
10.0000 meq | Freq: Once | INTRAVENOUS | Status: AC
  Administered 2023-06-19: 10 meq via INTRAVENOUS
  Filled 2023-06-19: qty 100

## 2023-06-19 MED ORDER — SODIUM CHLORIDE 0.9 % IV SOLN
500.0000 mg | INTRAVENOUS | Status: DC
  Administered 2023-06-19 – 2023-06-21 (×3): 500 mg via INTRAVENOUS
  Filled 2023-06-19 (×4): qty 5

## 2023-06-19 MED ORDER — METOPROLOL TARTRATE 25 MG PO TABS
25.0000 mg | ORAL_TABLET | Freq: Once | ORAL | Status: AC
  Administered 2023-06-19: 25 mg via ORAL
  Filled 2023-06-19 (×2): qty 1

## 2023-06-19 MED ORDER — LACTATED RINGERS IV BOLUS
1000.0000 mL | Freq: Once | INTRAVENOUS | Status: AC
  Administered 2023-06-19: 1000 mL via INTRAVENOUS

## 2023-06-19 MED ORDER — VANCOMYCIN 50 MG/ML ORAL SOLUTION
125.0000 mg | Freq: Four times a day (QID) | ORAL | Status: DC
  Administered 2023-06-19 – 2023-06-20 (×3): 125 mg via ORAL
  Filled 2023-06-19 (×4): qty 2.5

## 2023-06-19 MED ORDER — ONDANSETRON HCL 4 MG/2ML IJ SOLN
4.0000 mg | Freq: Four times a day (QID) | INTRAMUSCULAR | Status: DC | PRN
  Administered 2023-06-19 – 2023-06-22 (×2): 4 mg via INTRAVENOUS
  Filled 2023-06-19 (×2): qty 2

## 2023-06-19 MED ORDER — FENTANYL CITRATE PF 50 MCG/ML IJ SOSY
50.0000 ug | PREFILLED_SYRINGE | Freq: Once | INTRAMUSCULAR | Status: AC
  Administered 2023-06-19: 50 ug via INTRAMUSCULAR
  Filled 2023-06-19: qty 1

## 2023-06-19 MED ORDER — POLYETHYLENE GLYCOL 3350 17 G PO PACK: 17.0000 g | PACK | Freq: Every day | ORAL | Status: DC | PRN

## 2023-06-19 MED ORDER — OXYCODONE HCL 5 MG PO TABS
5.0000 mg | ORAL_TABLET | Freq: Four times a day (QID) | ORAL | Status: DC | PRN
  Administered 2023-06-21: 10 mg via ORAL
  Administered 2023-06-24: 5 mg via ORAL
  Administered 2023-06-26: 10 mg via ORAL
  Filled 2023-06-19 (×2): qty 2
  Filled 2023-06-19: qty 1

## 2023-06-19 MED ORDER — POTASSIUM CHLORIDE IN NACL 20-0.9 MEQ/L-% IV SOLN
INTRAVENOUS | Status: AC
  Filled 2023-06-19 (×2): qty 1000

## 2023-06-19 MED ORDER — FENTANYL CITRATE PF 50 MCG/ML IJ SOSY: 50.0000 ug | PREFILLED_SYRINGE | Freq: Once | INTRAMUSCULAR | Status: DC

## 2023-06-19 MED ORDER — ALBUTEROL SULFATE (2.5 MG/3ML) 0.083% IN NEBU: 2.5000 mg | INHALATION_SOLUTION | Freq: Four times a day (QID) | RESPIRATORY_TRACT | Status: DC | PRN

## 2023-06-19 NOTE — ED Provider Notes (Signed)
  Physical Exam  BP (!) 140/60   Pulse (!) 102   Temp 99.1 F (37.3 C) (Oral)   Resp 13   SpO2 100%   Physical Exam  Procedures  Procedures  ED Course / MDM    Medical Decision Making Amount and/or Complexity of Data Reviewed Labs: ordered.  Risk Prescription drug management.   ***  Finished treatment for C Difficile one week ago, began having abdominal pain

## 2023-06-19 NOTE — ED Notes (Signed)
Patient transported to CT 

## 2023-06-19 NOTE — ED Provider Triage Note (Signed)
Emergency Medicine Provider Triage Evaluation Note  Kimberly Beck , a 77 y.o. female  was evaluated in triage.  Pt complains of nausea, vomiting 5 days duration.  Recently treated for C. difficile..  Review of Systems  Positive:  Negative:   Physical Exam  BP 114/75 (BP Location: Left Arm)   Pulse (!) 136   Temp 98.2 F (36.8 C) (Oral)   Resp 20   SpO2 92%  Gen:   Awake, uncomfortable appearing Resp:  Normal effort  MSK:   Moves extremities without difficulty  Other:  Abdomen generalized tenderness  Medical Decision Making  Medically screening exam initiated at 9:36 AM.  Appropriate orders placed.  Kimberly Beck was informed that the remainder of the evaluation will be completed by another provider, this initial triage assessment does not replace that evaluation, and the importance of remaining in the ED until their evaluation is complete.  Patient tachycardic, appearing comfortable appearing.  Labs and Zofran ordered.  CT imaging ordered.  Alerted nursing staff the patient should be roomed rather than going to lobby.   Anders Simmonds T, DO 06/19/23 [phone]

## 2023-06-19 NOTE — ED Provider Notes (Signed)
[place] EMERGENCY DEPARTMENT AT [facility] Provider Note   CSN: [id number] Arrival date & time: 06/19/23  4098     History  Chief Complaint  Patient presents with   Nausea    Kimberly Beck is a 77 y.o. female.  Patient here with nausea and vomiting and some abdominal pain.  She just finished treatment for C. difficile about a week ago.  The next day afterwards she is having some abdominal pain and then the last 2 days she has had nausea and vomiting and constipation.  She is not passing gas.  She is not on any narcotic pain medicine for her shoulder which she recently broke as well.  She denies any suspicious food intake.  She denies any fever or chills.  She is having pain all over her belly but mostly having some uncontrollable nausea and vomiting.  Diarrhea is completely resolved and now she has been actually constipated the last few days and not passing gas.  She denies any abdominal surgery history.  Although she has had a hernia repair.  Patient has a history of lung cancer, DVT, hypertension.  The history is provided by the patient.       Home Medications Prior to Admission medications   Medication Sig Start Date End Date Taking? Authorizing Provider  acetaminophen (TYLENOL) 500 MG tablet Take 1,000 mg by mouth as needed for mild pain (pain score 1-3), moderate pain (pain score 4-6) or headache.   Yes [provider]  albuterol (VENTOLIN HFA) 108 (90 Base) MCG/ACT inhaler Inhale 2 puffs into the lungs every 6 (six) hours as needed for wheezing or shortness of breath. 05/20/20  Yes Icard, Rachel Bo, DO  aspirin EC 81 MG tablet Take 81 mg by mouth daily. Swallow whole.   Yes [provider]  calcium citrate (CALCITRATE - DOSED IN MG ELEMENTAL CALCIUM) 950 (200 Ca) MG tablet Take 200 mg of elemental calcium by mouth daily.   Yes [provider]  Cholecalciferol (VITAMIN D) 50 MCG (2000 UT) CAPS Take 2,000 Units by mouth daily.   Yes  [provider]  diphenhydrAMINE (BENADRYL ALLERGY) 25 MG tablet Take 25 mg by mouth daily as needed for allergies.   Yes [provider]  escitalopram (LEXAPRO) 10 MG tablet Take 10 mg by mouth daily.  04/02/19  Yes [provider]  famotidine (PEPCID) 20 MG tablet Take 1 tablet (20 mg total) by mouth daily. 05/23/23  Yes Kathlen Mody, MD  lidocaine (LIDODERM) 5 % Place 1 patch onto the skin daily. Remove & Discard patch within 12 hours or as directed by MD Patient taking differently: Place 1 patch onto the skin daily as needed (pain). 05/15/23  Yes Palumbo, April, MD  lisinopril-hydrochlorothiazide (ZESTORETIC) 20-25 MG tablet Take 1 tablet by mouth daily.    Yes [provider]  Menthol, Topical Analgesic, (BIOFREEZE COOL THE PAIN) 4 % GEL Apply 1 application  topically as needed (pain).   Yes [provider]  metoprolol succinate (TOPROL-XL) 25 MG 24 hr tablet TAKE 1 TABLET BY MOUTH DAILY 11/28/22  Yes Runell Gess, MD  montelukast (SINGULAIR) 10 MG tablet Take 10 mg by mouth daily. 01/07/20  Yes [provider]  Multiple Vitamins-Minerals (MULTIVITAMIN WOMEN 50+) TABS Take 1 tablet by mouth daily.   Yes [provider]  ondansetron (ZOFRAN-ODT) 4 MG disintegrating tablet Take 1 tablet (4 mg total) by mouth every 8 (eight) hours as needed for nausea or vomiting. 06/05/23  Yes Amin, Ankit C, MD  oxyCODONE (OXY IR/ROXICODONE) 5 MG immediate release tablet Take 5-10 mg by mouth every 6 (six) hours as needed for severe pain (pain score 7-10) or moderate pain (pain score 4-6). 05/30/23  Yes [provider]  rosuvastatin (CRESTOR) 5 MG tablet Take 1 tablet by mouth daily.   Yes [provider]  VALIUM 2 MG tablet Take 2 mg by mouth daily as needed for muscle spasms. Patient not taking: Reported on 06/19/2023 06/15/23   [provider]      Allergies    Codeine, Nsaids, Other, and Penicillins    Review of  Systems   Review of Systems  Physical Exam Updated Vital Signs BP (!) 140/60   Pulse (!) 102   Temp 99.1 F (37.3 C) (Oral)   Resp 13   SpO2 100%  Physical Exam Vitals and nursing note reviewed.  Constitutional:      General: She is not in acute distress.    Appearance: She is well-developed. She is ill-appearing.  HENT:     Head: Normocephalic and atraumatic.  Eyes:     Extraocular Movements: Extraocular movements intact.     Conjunctiva/sclera: Conjunctivae normal.     Pupils: Pupils are equal, round, and reactive to light.  Cardiovascular:     Rate and Rhythm: Normal rate and regular rhythm.     Pulses: Normal pulses.     Heart sounds: No murmur heard. Pulmonary:     Effort: Pulmonary effort is normal. No respiratory distress.     Breath sounds: Normal breath sounds.  Abdominal:     Palpations: Abdomen is soft.     Tenderness: There is abdominal tenderness.  Musculoskeletal:        General: No swelling.     Cervical back: Neck supple.  Skin:    General: Skin is warm and dry.     Capillary Refill: Capillary refill takes less than 2 seconds.  Neurological:     General: No focal deficit present.     Mental Status: She is alert.  Psychiatric:        Mood and Affect: Mood normal.     ED Results / Procedures / Treatments   Labs (all labs ordered are listed, but only abnormal results are displayed) Labs Reviewed  COMPREHENSIVE METABOLIC PANEL - Abnormal; Notable for the following components:      Result Value   Potassium 2.9 (*)    Glucose, Bld 152 (*)    Creatinine, Ser 1.05 (*)    Albumin 3.0 (*)    Total Bilirubin 1.6 (*)    GFR, Estimated 55 (*)    Anion gap 18 (*)    All other components within normal limits  CBC - Abnormal; Notable for the following components:   WBC 19.1 (*)    All other components within normal limits  LIPASE, BLOOD  URINALYSIS, ROUTINE W REFLEX MICROSCOPIC  TROPONIN I (HIGH SENSITIVITY)  TROPONIN I (HIGH SENSITIVITY)  TROPONIN I  (HIGH SENSITIVITY)    EKG EKG Interpretation Date/Time:  Tuesday June 19 2023 10:07:17 EST Ventricular Rate:  124 PR Interval:  82 QRS Duration:  113 QT Interval:  430 QTC Calculation: 618 R Axis:   -22  Text Interpretation: Sinus or ectopic atrial tachycardia Consider right atrial enlargement IRBBB and LPFB Abnormal R-wave progression, late transition Prolonged QT interval Confirmed by Virgina Norfolk [phone]) on 06/19/2023 10:19:19 AM  Radiology No results found.  Procedures Procedures    Medications Ordered in ED Medications  potassium chloride 10 mEq in 100 mL IVPB (has no administration in time range)  ondansetron (ZOFRAN-ODT) disintegrating tablet 4 mg (4 mg Oral Given 06/19/23 0937)  sodium chloride 0.9 % bolus 1,000 mL (0 mLs Intravenous Stopped 06/19/23 1356)  fentaNYL (SUBLIMAZE) injection 50 mcg (50 mcg Intramuscular Given 06/19/23 1101)  LORazepam (ATIVAN) injection 1 mg (1 mg Intravenous Given 06/19/23 1407)  sodium chloride 0.9 % bolus 1,000 mL (1,000 mLs Intravenous New Bag/Given 06/19/23 1306)  potassium chloride 10 mEq in 100 mL IVPB (0 mEq Intravenous Stopped 06/19/23 1408)  iohexol (OMNIPAQUE) 350 MG/ML injection 75 mL (75 mLs Intravenous Contrast Given 06/19/23 1446)    ED Course/ Medical Decision Making/ A&P                                 Medical Decision Making Amount and/or Complexity of Data Reviewed Labs: ordered.  Risk Prescription drug management.   Kimberly Beck is here with nausea and vomiting and abdominal pain.  She is tachycardic but otherwise unremarkable vitals.  She is got generalized tenderness on abdominal exam.  She is actively dry heaving and vomiting on my evaluation.  She has been with nausea and vomiting the last 2 days.  She has not had a bowel movement the last 5 days.  She has not passed gas last 2 days.  She finished taking her antibiotics for C. difficile about a week ago.  She was doing very well and then a couple  days after she finished her meds she started to have abdominal pain and nausea and vomiting.  But she has not had any diarrhea.  Overall differential diagnosis could be bowel obstruction, new viral process/colitis/food poisoning, seems less likely to be C. difficile given that she is not having any diarrhea.  Overall will evaluate with labs and a CT scan of abdomen pelvis.  Will give IV fluids, IV fentanyl, IV Zofran check CBC CMP lipase urinalysis.  Started had an EKG and troponin ordered in triage as well but she not having any chest pain shortness of breath and doubt cardiac process.  Patient per my review and interpretation with a potassium of 2.9.  Repletion started.  She does have a white count of 19.  Troponin negative x 2.  Bicarb 22, creatinine 1.05.  Overall patient awaiting urinalysis and CT scan abdomen and pelvis at time of handoff to oncoming ED staff.  Please see their note for further results, evaluation, disposition of the patient.  This chart was dictated using voice recognition software.  Despite best efforts to proofread,  errors can occur which can change the documentation meaning.         Final Clinical Impression(s) / ED Diagnoses Final diagnoses:  Abdominal pain, unspecified abdominal location  Nausea and vomiting, unspecified vomiting type    Rx / DC Orders ED Discharge Orders     None         Virgina Norfolk, DO 06/19/23 1525

## 2023-06-19 NOTE — ED Notes (Signed)
Phlebotomy stated they will come get pt blood work

## 2023-06-19 NOTE — ED Triage Notes (Signed)
Patient arrived by EMS with complaint of nausea and diarrhea. Reports that she has had c.diff in November and think she has again. Was to see her primary yesterday for same and canceled appointment because too sick. Patient alert and oriented. Reports vomiting no diarrhea x 5 days no pain

## 2023-06-19 NOTE — ED Notes (Signed)
I was unsuccessful with trying to get pt's blood work or place an IV in pt. I notified phlebotomy and they said they will try. I placed an IV team consult in for pt to get IV

## 2023-06-19 NOTE — ED Notes (Signed)
ED TO INPATIENT HANDOFF REPORT  ED Nurse Name and Phone #: [phone]  S Name/Age/Gender Kimberly Beck 77 y.o. female Room/Bed: 036C/036C  Code Status   Code Status: Full Code  Home/SNF/Other Home Patient oriented to: self, place, time, and situation Is this baseline? Yes   Triage Complete: Triage complete  Chief Complaint Pneumonia [J18.9]  Triage Note Patient arrived by EMS with complaint of nausea and diarrhea. Reports that she has had c.diff in November and think she has again. Was to see her primary yesterday for same and canceled appointment because too sick. Patient alert and oriented. Reports vomiting no diarrhea x 5 days no pain   Allergies Allergies  Allergen Reactions   Codeine Itching    Drunk feeling    Nsaids Other (See Comments)    Liver    Other Diarrhea    headache, Global anesthesia, edema   Penicillins Rash    Dry rash    Level of Care/Admitting Diagnosis ED Disposition     ED Disposition  Admit   Condition  --   Comment  Hospital Area: [facility] [[id number]]  Level of Care: Med-Surg [16]  May admit patient to [facility] or [facility] if equivalent level of care is available:: No  Covid Evaluation: Asymptomatic - no recent exposure (last 10 days) testing not required  Diagnosis: Pneumonia [227785]  Admitting Physician: Marinda Elk [[id number]]  Attending Physician: Marinda Elk [[id number]]  Certification:: I certify this patient will need inpatient services for at least 2 midnights          B Medical/Surgery History Past Medical History:  Diagnosis Date   Allergy    Anemia    Cancer (HCC)    Complication of anesthesia    Depression    DVT, lower extremity, recurrent, right (HCC)    History of hiatal hernia    HTN (hypertension)    Lung cancer (HCC)    Right   Pneumonia    PONV (postoperative nausea and vomiting)    N/V only after receiving ether.   Pre-diabetes    BS checks daily, takes no  medicines   Past Surgical History:  Procedure Laterality Date   ESOPHAGOGASTRODUODENOSCOPY N/A 07/29/2020   Procedure: ESOPHAGOGASTRODUODENOSCOPY (EGD);  Surgeon: Corliss Skains, MD;  Location: [facility] OR;  Service: Thoracic;  Laterality: N/A;   EYE SURGERY     Bilateral cataract removal   INTERCOSTAL NERVE BLOCK Right 04/29/2020   Procedure: INTERCOSTAL NERVE BLOCK;  Surgeon: Corliss Skains, MD;  Location: [facility] OR;  Service: Thoracic;  Laterality: Right;   LYMPH NODE DISSECTION Right 04/29/2020   Procedure: LYMPH NODE DISSECTION;  Surgeon: Corliss Skains, MD;  Location: [facility] OR;  Service: Thoracic;  Laterality: Right;   TONSILLECTOMY     TONSILLECTOMY AND ADENOIDECTOMY     TUBAL LIGATION     XI ROBOTIC ASSISTED PARAESOPHAGEAL HERNIA REPAIR N/A 07/29/2020   Procedure: XI ROBOTIC ASSISTED PARAESOPHAGEAL HERNIA REPAIR;  Surgeon: Corliss Skains, MD;  Location: [facility] OR;  Service: Thoracic;  Laterality: N/A;     A IV Location/Drains/Wounds Patient Lines/Drains/Airways Status     Active Line/Drains/Airways     Name Placement date Placement time Site Days   Peripheral IV 06/19/23 20 G 1.88" Left Antecubital 06/19/23  1125  Antecubital  less than 1            Intake/Output Last 24 hours  Intake/Output Summary (Last 24 hours) at 06/19/2023 1816 Last data filed at 06/19/2023 1408  Gross per 24 hour  Intake 1100 ml  Output --  Net 1100 ml    Labs/Imaging Results for orders placed or performed during the hospital encounter of 06/19/23 (from the past 48 hours)  Lipase, blood     Status: None   Collection Time: 06/19/23  9:33 AM  Result Value Ref Range   Lipase 25 11 - 51 U/L    Comment: Performed at [facility], [address], [place], [place] [address]  Comprehensive metabolic panel     Status: Abnormal   Collection Time: 06/19/23  9:33 AM  Result Value Ref Range   Sodium 138 135 - 145 mmol/L   Potassium 2.9 (L) 3.5 - 5.1 mmol/L   Chloride 98 98 - 111  mmol/L   CO2 22 22 - 32 mmol/L   Glucose, Bld 152 (H) 70 - 99 mg/dL    Comment: Glucose reference range applies only to samples taken after fasting for at least 8 hours.   BUN 15 8 - 23 mg/dL   Creatinine, Ser 6.04 (H) 0.44 - 1.00 mg/dL   Calcium 8.9 8.9 - 54.0 mg/dL   Total Protein 6.7 6.5 - 8.1 g/dL   Albumin 3.0 (L) 3.5 - 5.0 g/dL   AST 16 15 - 41 U/L   ALT 9 0 - 44 U/L   Alkaline Phosphatase 106 38 - 126 U/L   Total Bilirubin 1.6 (H) <1.2 mg/dL   GFR, Estimated 55 (L) >60 mL/min    Comment: (NOTE) Calculated using the CKD-EPI Creatinine Equation (2021)    Anion gap 18 (H) 5 - 15    Comment: Performed at [facility], [address] [place], [place] [address]  CBC     Status: Abnormal   Collection Time: 06/19/23  9:33 AM  Result Value Ref Range   WBC 19.1 (H) 4.0 - 10.5 K/uL   RBC 4.53 3.87 - 5.11 MIL/uL   Hemoglobin 13.1 12.0 - 15.0 g/dL   HCT 14.7 82.9 - 56.2 %   MCV 90.1 80.0 - 100.0 fL   MCH 28.9 26.0 - 34.0 pg   MCHC 32.1 30.0 - 36.0 g/dL   RDW 13.0 86.5 - 78.4 %   Platelets 254 150 - 400 K/uL   nRBC 0.0 0.0 - 0.2 %    Comment: Performed at [facility], [address], [place], [place] [address]  Troponin I (High Sensitivity)     Status: None   Collection Time: 06/19/23  9:33 AM  Result Value Ref Range   Troponin I (High Sensitivity) 14 <18 ng/L    Comment: (NOTE) Elevated high sensitivity troponin I (hsTnI) values and significant  changes across serial measurements may suggest ACS but many other  chronic and acute conditions are known to elevate hsTnI results.  Refer to the "Links" section for chest pain algorithms and additional  guidance. Performed at [facility], [address] [place], [place] [address]   Troponin I (High Sensitivity)     Status: None   Collection Time: 06/19/23 11:33 AM  Result Value Ref Range   Troponin I (High Sensitivity) 10 <18 ng/L    Comment: (NOTE) Elevated high sensitivity troponin I (hsTnI) values and  significant  changes across serial measurements may suggest ACS but many other  chronic and acute conditions are known to elevate hsTnI results.  Refer to the "Links" section for chest pain algorithms and additional  guidance. Performed at [facility], [address], [place], [place] [address]   Troponin I (High Sensitivity)     Status: None   Collection Time: 06/19/23  2:21 PM  Result Value Ref Range   Troponin I (High Sensitivity) 14 <18 ng/L    Comment: (NOTE) Elevated high sensitivity troponin I (hsTnI) values and significant  changes across serial measurements may suggest ACS but many other  chronic and acute conditions are known to elevate hsTnI results.  Refer to the "Links" section for chest pain algorithms and additional  guidance. Performed at [facility], [address], [place], [place] [address]    CT ABDOMEN PELVIS W CONTRAST Result Date: 06/19/2023 CLINICAL DATA:  Abdominal pain, acute, nonlocalized EXAM: CT ABDOMEN AND PELVIS WITH CONTRAST TECHNIQUE: Multidetector CT imaging of the abdomen and pelvis was performed using the standard protocol following bolus administration of intravenous contrast. RADIATION DOSE REDUCTION: This exam was performed according to the departmental dose-optimization program which includes automated exposure control, adjustment of the mA and/or kV according to patient size and/or use of iterative reconstruction technique. CONTRAST:  75mL OMNIPAQUE IOHEXOL 350 MG/ML SOLN COMPARISON:  06/02/2023 FINDINGS: Lower chest: Trace right pleural effusion. Patchy right lower lobe airspace opacity. Minimal left basilar subsegmental atelectasis. Heart size is normal. Aortic and coronary artery atherosclerosis. Hepatobiliary: Scattered low-density lesions within the liver are stable in appearance from prior, likely cysts. Unremarkable gallbladder. No hyperdense gallstone. No biliary dilatation. Pancreas: Unremarkable. No pancreatic ductal dilatation  or surrounding inflammatory changes. Spleen: Normal in size without focal abnormality. Adrenals/Urinary Tract: Unremarkable adrenal glands. Kidneys enhance symmetrically without focal lesion, stone, or hydronephrosis. Ureters are nondilated. Urinary bladder appears unremarkable for the degree of distention. Stomach/Bowel: Small hiatal hernia. Stomach otherwise unremarkable. No dilated loops of bowel to suggest obstruction. Normal appendix in the right hemiabdomen (series 6, image 41). Scattered colonic diverticulosis. No focally inflamed diverticulum. Mild long segment wall thickening of the descending and sigmoid colon, improving compared to the prior study. Vascular/Lymphatic: Aortic atherosclerosis. No enlarged abdominal or pelvic lymph nodes. Reproductive: Uterus within normal limits. 1.4 cm simple right adnexal cyst. No follow-up imaging recommended. Note: This recommendation does not apply to premenarchal patients and to those with increased risk (genetic, family history, elevated tumor markers or other high-risk factors) of ovarian cancer. Reference: JACR 2020 Feb; 17(2):248-254. Unremarkable left adnexum. Other: No free fluid. No abdominopelvic fluid collection. No pneumoperitoneum. No abdominal wall hernia. Musculoskeletal: Chronic compression deformities of the T12 and L1 vertebral bodies, unchanged. No acute bony abnormality. IMPRESSION: 1. Mild colitis involving the descending and sigmoid colon, improving compared to the prior study. 2. Colonic diverticulosis without evidence of acute diverticulitis. 3. Trace right pleural effusion. Patchy right lower lobe airspace opacity, which may represent atelectasis or pneumonia. 4. Small hiatal hernia. 5. Aortic and coronary artery atherosclerosis (ICD10-I70.0). Electronically Signed   By: Duanne Guess D.O.   On: 06/19/2023 16:36    Pending Labs Unresulted Labs (From admission, onward)     Start     Ordered   06/19/23 1702  Resp panel by RT-PCR (RSV,  Flu A&B, Covid) Anterior Nasal Swab  Once,   URGENT        06/19/23 1702   06/19/23 1659  Blood culture (routine x 2)  BLOOD CULTURE X 2,   R (with STAT occurrences)      06/19/23 1702   06/19/23 0933  Urinalysis, Routine w reflex microscopic -Urine, Clean Catch  Once,   URGENT       Question Answer Comment  Specimen Source Urine, Clean Catch   Release to patient Immediate      06/19/23 0933   Signed and Held  CBC  (enoxaparin (LOVENOX)    CrCl >/= 30 ml/min)  Once,   R       Comments: Baseline for enoxaparin therapy IF NOT ALREADY DRAWN.  Notify MD if PLT < 100 K.    Signed and Held   Signed and Held  Creatinine, serum  (enoxaparin (LOVENOX)    CrCl >/= 30 ml/min)  Once,   R       Comments: Baseline for enoxaparin therapy IF NOT ALREADY DRAWN.    Signed and Held   Signed and Held  Creatinine, serum  (enoxaparin (LOVENOX)    CrCl >/= 30 ml/min)  Weekly,   R     Comments: while on enoxaparin therapy    Signed and Held   Signed and Held  CBC  Tomorrow morning,   R        Signed and Held   Signed and Held  Basic metabolic panel  Tomorrow morning,   R        Signed and Held            Vitals/Pain Today's Vitals   06/19/23 1345 06/19/23 1400 06/19/23 1430 06/19/23 1753  BP:  138/80 (!) 140/60   Pulse:  (!) 104 (!) 102   Resp:  12 13   Temp: 99.1 F (37.3 C)   99 F (37.2 C)  TempSrc: Oral     SpO2:  100% 100%   PainSc:        Isolation Precautions No active isolations  Medications Medications  potassium chloride 10 mEq in 100 mL IVPB (has no administration in time range)  lactated ringers bolus 1,000 mL (has no administration in time range)  metoprolol tartrate (LOPRESSOR) tablet 25 mg (has no administration in time range)  fidaxomicin (DIFICID) tablet 200 mg (has no administration in time range)  cefTRIAXone (ROCEPHIN) 2 g in sodium chloride 0.9 % 100 mL IVPB (has no administration in time range)  azithromycin (ZITHROMAX) 500 mg in sodium chloride 0.9 % 250 mL  IVPB (has no administration in time range)  ondansetron (ZOFRAN-ODT) disintegrating tablet 4 mg (4 mg Oral Given 06/19/23 0937)  sodium chloride 0.9 % bolus 1,000 mL (0 mLs Intravenous Stopped 06/19/23 1356)  fentaNYL (SUBLIMAZE) injection 50 mcg (50 mcg Intramuscular Given 06/19/23 1101)  LORazepam (ATIVAN) injection 1 mg (1 mg Intravenous Given 06/19/23 1407)  sodium chloride 0.9 % bolus 1,000 mL (1,000 mLs Intravenous New Bag/Given 06/19/23 1306)  potassium chloride 10 mEq in 100 mL IVPB (0 mEq Intravenous Stopped 06/19/23 1408)  iohexol (OMNIPAQUE) 350 MG/ML injection 75 mL (75 mLs Intravenous Contrast Given 06/19/23 1446)    Mobility walks with person assist     Focused Assessments    R Recommendations: See Admitting Provider Note  Report given to:   Additional Notes:

## 2023-06-19 NOTE — H&P (Signed)
History and Physical  Kimberly Beck:[id number] DOB: 09/05/1945 DOA: 06/19/2023  PCP: Jarrett Soho, PA-C Patient coming from: Home  I have personally briefly reviewed patient's old medical records in [facility]   Chief Complaint: Nausea vomiting and flank abdominal chest pain pleuritic in nature  HPI: Kimberly Beck is a 77 y.o. female past medical history of recently treated C. difficile colitis with her last dose on 06/16/2023, history of lung cancer in remission essential hypertension who comes in as when she is finished her C. difficile treatment she started having abdominal pain and persistent nausea and vomiting not able to keep anything down has not had a bowel movement is not passing gas.  She relates some new cough and shortness of breath with exertion. She relates her flank pain is pleuritic in nature.  Denies any fever or sick contacts.   The ED: She was found to be mildly tachycardic satting 100% on room air white count of 19,000, troponins are flat.  CT scan of the abdomen pelvis showed mild colitis which is improving but showed trace right-sided pleural effusion with patchy right lower lobe airspace disease   Review of Systems: All systems reviewed and apart from history of presenting illness, are negative.  Past Medical History:  Diagnosis Date   Allergy    Anemia    Cancer (HCC)    Complication of anesthesia    Depression    DVT, lower extremity, recurrent, right (HCC)    History of hiatal hernia    HTN (hypertension)    Lung cancer (HCC)    Right   Pneumonia    PONV (postoperative nausea and vomiting)    N/V only after receiving ether.   Pre-diabetes    BS checks daily, takes no medicines   Past Surgical History:  Procedure Laterality Date   ESOPHAGOGASTRODUODENOSCOPY N/A 07/29/2020   Procedure: ESOPHAGOGASTRODUODENOSCOPY (EGD);  Surgeon: Corliss Skains, MD;  Location: [facility] OR;  Service: Thoracic;  Laterality: N/A;   EYE  SURGERY     Bilateral cataract removal   INTERCOSTAL NERVE BLOCK Right 04/29/2020   Procedure: INTERCOSTAL NERVE BLOCK;  Surgeon: Corliss Skains, MD;  Location: [facility] OR;  Service: Thoracic;  Laterality: Right;   LYMPH NODE DISSECTION Right 04/29/2020   Procedure: LYMPH NODE DISSECTION;  Surgeon: Corliss Skains, MD;  Location: [facility] OR;  Service: Thoracic;  Laterality: Right;   TONSILLECTOMY     TONSILLECTOMY AND ADENOIDECTOMY     TUBAL LIGATION     XI ROBOTIC ASSISTED PARAESOPHAGEAL HERNIA REPAIR N/A 07/29/2020   Procedure: XI ROBOTIC ASSISTED PARAESOPHAGEAL HERNIA REPAIR;  Surgeon: Corliss Skains, MD;  Location: [facility] OR;  Service: Thoracic;  Laterality: N/A;   Social History:  reports that she quit smoking about 37 years ago. Her smoking use included cigarettes. She started smoking about 63 years ago. She has a 75 pack-year smoking history. She has never used smokeless tobacco. She reports that she does not drink alcohol and does not use drugs.   Allergies  Allergen Reactions   Codeine Itching    Drunk feeling    Nsaids Other (See Comments)    Liver    Other Diarrhea    headache, Global anesthesia, edema   Penicillins Rash    Dry rash    Family History  Problem Relation Age of Onset   Heart attack Father    Stroke Maternal Grandfather      Prior to Admission medications   Medication Sig Start  Date End Date Taking? Authorizing Provider  acetaminophen (TYLENOL) 500 MG tablet Take 1,000 mg by mouth as needed for mild pain (pain score 1-3), moderate pain (pain score 4-6) or headache.   Yes [provider]  albuterol (VENTOLIN HFA) 108 (90 Base) MCG/ACT inhaler Inhale 2 puffs into the lungs every 6 (six) hours as needed for wheezing or shortness of breath. 05/20/20  Yes Icard, Rachel Bo, DO  aspirin EC 81 MG tablet Take 81 mg by mouth daily. Swallow whole.   Yes [provider]  calcium citrate (CALCITRATE - DOSED IN MG ELEMENTAL CALCIUM) 950 (200 Ca) MG  tablet Take 200 mg of elemental calcium by mouth daily.   Yes [provider]  Cholecalciferol (VITAMIN D) 50 MCG (2000 UT) CAPS Take 2,000 Units by mouth daily.   Yes [provider]  diphenhydrAMINE (BENADRYL ALLERGY) 25 MG tablet Take 25 mg by mouth daily as needed for allergies.   Yes [provider]  escitalopram (LEXAPRO) 10 MG tablet Take 10 mg by mouth daily.  04/02/19  Yes [provider]  famotidine (PEPCID) 20 MG tablet Take 1 tablet (20 mg total) by mouth daily. 05/23/23  Yes Kathlen Mody, MD  lidocaine (LIDODERM) 5 % Place 1 patch onto the skin daily. Remove & Discard patch within 12 hours or as directed by MD Patient taking differently: Place 1 patch onto the skin daily as needed (pain). 05/15/23  Yes Palumbo, April, MD  lisinopril-hydrochlorothiazide (ZESTORETIC) 20-25 MG tablet Take 1 tablet by mouth daily.    Yes [provider]  Menthol, Topical Analgesic, (BIOFREEZE COOL THE PAIN) 4 % GEL Apply 1 application  topically as needed (pain).   Yes [provider]  metoprolol succinate (TOPROL-XL) 25 MG 24 hr tablet TAKE 1 TABLET BY MOUTH DAILY 11/28/22  Yes Runell Gess, MD  montelukast (SINGULAIR) 10 MG tablet Take 10 mg by mouth daily. 01/07/20  Yes [provider]  Multiple Vitamins-Minerals (MULTIVITAMIN WOMEN 50+) TABS Take 1 tablet by mouth daily.   Yes [provider]  ondansetron (ZOFRAN-ODT) 4 MG disintegrating tablet Take 1 tablet (4 mg total) by mouth every 8 (eight) hours as needed for nausea or vomiting. 06/05/23  Yes Amin, Ankit C, MD  oxyCODONE (OXY IR/ROXICODONE) 5 MG immediate release tablet Take 5-10 mg by mouth every 6 (six) hours as needed for severe pain (pain score 7-10) or moderate pain (pain score 4-6). 05/30/23  Yes [provider]  rosuvastatin (CRESTOR) 5 MG tablet Take 1 tablet by mouth daily.   Yes [provider]  VALIUM 2 MG tablet Take 2 mg by mouth daily as needed  for muscle spasms. Patient not taking: Reported on 06/19/2023 06/15/23   [provider]   Physical Exam: Vitals:   06/19/23 1330 06/19/23 1345 06/19/23 1400 06/19/23 1430  BP: (!) 141/74  138/80 (!) 140/60  Pulse: (!) 101  (!) 104 (!) 102  Resp: 16  12 13   Temp:  99.1 F (37.3 C)    TempSrc:  Oral    SpO2: 96%  100% 100%    General exam: Moderately built and nourished patient, lying comfortably supine on the gurney  Head, eyes and ENT: Nontraumatic and normocephalic.  Mucous membranes are dry Neck: Supple. No JVD, carotid bruit or thyromegaly. Lymphatics: No lymphadenopathy. Respiratory system: Clear to auscultation. No increased work of breathing. Cardiovascular system: S1 and S2 heard, RRR. No JVD Gastrointestinal system: Positive bowel sound soft nontender nondistended Central nervous system: Alert  and oriented. No focal neurological deficits. Extremities: Symmetric 5 x 5 power. Peripheral pulses symmetrically felt.  Skin: No rashes or acute findings. Musculoskeletal system: Negative exam. Psychiatry: Pleasant and cooperative.   Labs on Admission:  Basic Metabolic Panel: Recent Labs  Lab 06/19/23 0933  NA 138  K 2.9*  CL 98  CO2 22  GLUCOSE 152*  BUN 15  CREATININE 1.05*  CALCIUM 8.9   Liver Function Tests: Recent Labs  Lab 06/19/23 0933  AST 16  ALT 9  ALKPHOS 106  BILITOT 1.6*  PROT 6.7  ALBUMIN 3.0*   Recent Labs  Lab 06/19/23 0933  LIPASE 25   No results for input(s): "AMMONIA" in the last 168 hours. CBC: Recent Labs  Lab 06/19/23 0933  WBC 19.1*  HGB 13.1  HCT 40.8  MCV 90.1  PLT 254   Cardiac Enzymes: No results for input(s): "CKTOTAL", "CKMB", "CKMBINDEX", "TROPONINI" in the last 168 hours.  BNP (last 3 results) No results for input(s): "PROBNP" in the last 8760 hours. CBG: No results for input(s): "GLUCAP" in the last 168 hours.  Radiological Exams on Admission: CT ABDOMEN PELVIS W CONTRAST Result Date:  06/19/2023 CLINICAL DATA:  Abdominal pain, acute, nonlocalized EXAM: CT ABDOMEN AND PELVIS WITH CONTRAST TECHNIQUE: Multidetector CT imaging of the abdomen and pelvis was performed using the standard protocol following bolus administration of intravenous contrast. RADIATION DOSE REDUCTION: This exam was performed according to the departmental dose-optimization program which includes automated exposure control, adjustment of the mA and/or kV according to patient size and/or use of iterative reconstruction technique. CONTRAST:  75mL OMNIPAQUE IOHEXOL 350 MG/ML SOLN COMPARISON:  06/02/2023 FINDINGS: Lower chest: Trace right pleural effusion. Patchy right lower lobe airspace opacity. Minimal left basilar subsegmental atelectasis. Heart size is normal. Aortic and coronary artery atherosclerosis. Hepatobiliary: Scattered low-density lesions within the liver are stable in appearance from prior, likely cysts. Unremarkable gallbladder. No hyperdense gallstone. No biliary dilatation. Pancreas: Unremarkable. No pancreatic ductal dilatation or surrounding inflammatory changes. Spleen: Normal in size without focal abnormality. Adrenals/Urinary Tract: Unremarkable adrenal glands. Kidneys enhance symmetrically without focal lesion, stone, or hydronephrosis. Ureters are nondilated. Urinary bladder appears unremarkable for the degree of distention. Stomach/Bowel: Small hiatal hernia. Stomach otherwise unremarkable. No dilated loops of bowel to suggest obstruction. Normal appendix in the right hemiabdomen (series 6, image 41). Scattered colonic diverticulosis. No focally inflamed diverticulum. Mild long segment wall thickening of the descending and sigmoid colon, improving compared to the prior study. Vascular/Lymphatic: Aortic atherosclerosis. No enlarged abdominal or pelvic lymph nodes. Reproductive: Uterus within normal limits. 1.4 cm simple right adnexal cyst. No follow-up imaging recommended. Note: This recommendation does not  apply to premenarchal patients and to those with increased risk (genetic, family history, elevated tumor markers or other high-risk factors) of ovarian cancer. Reference: JACR 2020 Feb; 17(2):248-254. Unremarkable left adnexum. Other: No free fluid. No abdominopelvic fluid collection. No pneumoperitoneum. No abdominal wall hernia. Musculoskeletal: Chronic compression deformities of the T12 and L1 vertebral bodies, unchanged. No acute bony abnormality. IMPRESSION: 1. Mild colitis involving the descending and sigmoid colon, improving compared to the prior study. 2. Colonic diverticulosis without evidence of acute diverticulitis. 3. Trace right pleural effusion. Patchy right lower lobe airspace opacity, which may represent atelectasis or pneumonia. 4. Small hiatal hernia. 5. Aortic and coronary artery atherosclerosis (ICD10-I70.0). Electronically Signed   By: Duanne Guess D.O.   On: 06/19/2023 16:36    EKG: Independently reviewed.  Sinus tachycardia with normal axis V1-V3 T wave inversions with no reciprocal  changes  Assessment/Plan Probable hospital-acquired  Pneumonia She has a white count 19,000 no abdominal pain with some pleuritic chest pain with a cough shortness of breath and a CT scan that showed right-sided pleural effusion with patchy right lower lobe infiltrates. Was started empirically on Rocephin and azithromycin. Check blood cultures x 2.    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 Will hold antihypertensive medication. Started on IV fluids recheck basic metabolic panel in the morning.  Essential HTN (hypertension): Hold antihypertensive medication diuretic and ACE inhibitor due to acute kidney injury.  Hypokalemia: Likely due to nausea and vomiting replete orally recheck in the morning.  History of C. difficile colitis: Will start empirically on vent treatment as she just  finished her antibiotic regimen for C. difficile.  Highly regular recurrence.  Sinus tachycardia Likely due to hypovolemia started on IV fluids.    DVT Prophylaxis: lovevenox Code Status: Full   Family Communication: None  Disposition Plan: inpatient  It is my clinical opinion that admission to INPATIENT is reasonable and necessary in this 77 y.o. female comes in with cough shortness of breath infiltrate on imaging, leukocytosis intractable nausea and vomiting likely due to pneumonia and vomiting IV antibiotics  Given the aforementioned, the predictability of an adverse outcome is felt to be significant. I expect that the patient will require at least 2 midnights in the hospital to treat this condition.  Marinda Elk MD [facility]   06/19/2023, 5:36 PM

## 2023-06-20 DIAGNOSIS — R109 Unspecified abdominal pain: Secondary | ICD-10-CM | POA: Diagnosis not present

## 2023-06-20 DIAGNOSIS — R112 Nausea with vomiting, unspecified: Secondary | ICD-10-CM | POA: Diagnosis not present

## 2023-06-20 DIAGNOSIS — J189 Pneumonia, unspecified organism: Secondary | ICD-10-CM | POA: Diagnosis not present

## 2023-06-20 LAB — BLOOD CULTURE ID PANEL (REFLEXED) - BCID2

## 2023-06-20 LAB — MRSA NEXT GEN BY PCR, NASAL: MRSA by PCR Next Gen: NOT DETECTED

## 2023-06-20 LAB — CBC
HCT: 32.4 % — ABNORMAL LOW (ref 36.0–46.0)
Hemoglobin: 10.2 g/dL — ABNORMAL LOW (ref 12.0–15.0)
MCH: 28.9 pg (ref 26.0–34.0)
MCHC: 31.5 g/dL (ref 30.0–36.0)
MCV: 91.8 fL (ref 80.0–100.0)
Platelets: 217 10*3/uL (ref 150–400)
RBC: 3.53 MIL/uL — ABNORMAL LOW (ref 3.87–5.11)
RDW: 14.9 % (ref 11.5–15.5)
WBC: 12.6 10*3/uL — ABNORMAL HIGH (ref 4.0–10.5)
nRBC: 0 % (ref 0.0–0.2)

## 2023-06-20 LAB — BASIC METABOLIC PANEL
Anion gap: 10 (ref 5–15)
BUN: 12 mg/dL (ref 8–23)
CO2: 21 mmol/L — ABNORMAL LOW (ref 22–32)
Calcium: 7.2 mg/dL — ABNORMAL LOW (ref 8.9–10.3)
Chloride: 110 mmol/L (ref 98–111)
Creatinine, Ser: 0.75 mg/dL (ref 0.44–1.00)
GFR, Estimated: >60 mL/min (ref >60)
Glucose, Bld: 102 mg/dL — ABNORMAL HIGH (ref 70–99)
Potassium: 3.2 mmol/L — ABNORMAL LOW (ref 3.5–5.1)
Sodium: 141 mmol/L (ref 135–145)

## 2023-06-20 MED ORDER — PANTOPRAZOLE SODIUM 40 MG PO TBEC
40.0000 mg | DELAYED_RELEASE_TABLET | Freq: Two times a day (BID) | ORAL | Status: DC
  Administered 2023-06-20 – 2023-06-26 (×11): 40 mg via ORAL
  Filled 2023-06-20 (×14): qty 1

## 2023-06-20 MED ORDER — POTASSIUM CHLORIDE 20 MEQ PO PACK
40.0000 meq | PACK | Freq: Two times a day (BID) | ORAL | Status: AC
  Administered 2023-06-20 (×2): 40 meq via ORAL
  Filled 2023-06-20 (×2): qty 2

## 2023-06-20 MED ORDER — VANCOMYCIN 50 MG/ML ORAL SOLUTION
125.0000 mg | Freq: Three times a day (TID) | ORAL | Status: DC
  Filled 2023-06-20: qty 2.5

## 2023-06-20 MED ORDER — POLYETHYLENE GLYCOL 3350 17 G PO PACK
17.0000 g | PACK | Freq: Two times a day (BID) | ORAL | Status: AC
  Administered 2023-06-20 – 2023-06-21 (×2): 17 g via ORAL
  Filled 2023-06-20 (×3): qty 1

## 2023-06-20 MED ORDER — FAMOTIDINE 20 MG PO TABS
40.0000 mg | ORAL_TABLET | Freq: Two times a day (BID) | ORAL | Status: DC
  Administered 2023-06-20 – 2023-06-26 (×13): 40 mg via ORAL
  Filled 2023-06-20 (×13): qty 2

## 2023-06-20 MED ORDER — VANCOMYCIN HCL 125 MG PO CAPS
125.0000 mg | ORAL_CAPSULE | Freq: Two times a day (BID) | ORAL | Status: DC
  Administered 2023-06-21 – 2023-06-23 (×7): 125 mg via ORAL
  Filled 2023-06-20 (×10): qty 1

## 2023-06-20 MED ORDER — SODIUM CHLORIDE 0.9 % IV BOLUS
1000.0000 mL | Freq: Once | INTRAVENOUS | Status: AC
  Administered 2023-06-20: 1000 mL via INTRAVENOUS

## 2023-06-20 MED ORDER — SODIUM CHLORIDE 0.9 % IV SOLN
2.0000 g | INTRAVENOUS | Status: DC
  Administered 2023-06-20 – 2023-06-22 (×3): 2 g via INTRAVENOUS
  Filled 2023-06-20 (×4): qty 20

## 2023-06-20 NOTE — Progress Notes (Signed)
PHARMACY - PHYSICIAN COMMUNICATION CRITICAL VALUE ALERT - BLOOD CULTURE IDENTIFICATION (BCID)  Kimberly Beck is an 77 y.o. female who presented to [facility] on 06/19/2023 with a chief complaint of N/V and abdominal pain with concern for PNA.  Recently completed antibiotics for C diff colitis (EOT 06/16/23)   Assessment:  1/3 bottes BCID staph spp - likely contaminant   Name of physician (or Provider) Contacted: Dr. Julian Reil  Current antibiotics: Rocephin 2gm IV Q24h, Azithromycin 500 mg IV Q24h  Changes to prescribed antibiotics recommended:  Patient is on recommended antibiotics - No changes needed  Results for orders placed or performed during the hospital encounter of 06/19/23  Blood Culture ID Panel (Reflexed) (Collected: 06/19/2023  6:44 PM)  Result Value Ref Range   Enterococcus faecalis NOT DETECTED NOT DETECTED   Enterococcus Faecium NOT DETECTED NOT DETECTED   Listeria monocytogenes NOT DETECTED NOT DETECTED   Staphylococcus species DETECTED (A) NOT DETECTED   Staphylococcus aureus (BCID) NOT DETECTED NOT DETECTED   Staphylococcus epidermidis NOT DETECTED NOT DETECTED   Staphylococcus lugdunensis NOT DETECTED NOT DETECTED   Streptococcus species NOT DETECTED NOT DETECTED   Streptococcus agalactiae NOT DETECTED NOT DETECTED   Streptococcus pneumoniae NOT DETECTED NOT DETECTED   Streptococcus pyogenes NOT DETECTED NOT DETECTED   A.calcoaceticus-baumannii NOT DETECTED NOT DETECTED   Bacteroides fragilis NOT DETECTED NOT DETECTED   Enterobacterales NOT DETECTED NOT DETECTED   Enterobacter cloacae complex NOT DETECTED NOT DETECTED   Escherichia coli NOT DETECTED NOT DETECTED   Klebsiella aerogenes NOT DETECTED NOT DETECTED   Klebsiella oxytoca NOT DETECTED NOT DETECTED   Klebsiella pneumoniae NOT DETECTED NOT DETECTED   Proteus species NOT DETECTED NOT DETECTED   Salmonella species NOT DETECTED NOT DETECTED   Serratia marcescens NOT DETECTED NOT DETECTED   Haemophilus  influenzae NOT DETECTED NOT DETECTED   Neisseria meningitidis NOT DETECTED NOT DETECTED   Pseudomonas aeruginosa NOT DETECTED NOT DETECTED   Stenotrophomonas maltophilia NOT DETECTED NOT DETECTED   Candida albicans NOT DETECTED NOT DETECTED   Candida auris NOT DETECTED NOT DETECTED   Candida glabrata NOT DETECTED NOT DETECTED   Candida krusei NOT DETECTED NOT DETECTED   Candida parapsilosis NOT DETECTED NOT DETECTED   Candida tropicalis NOT DETECTED NOT DETECTED   Cryptococcus neoformans/gattii NOT DETECTED NOT DETECTED    Trixie Rude, PharmD Clinical Pharmacist 06/20/2023  9:19 PM

## 2023-06-20 NOTE — Progress Notes (Signed)
[facility] PROGRESS NOTE    Progress Note  [name]  WUJ:[id number] DOB: [birth year] DOA: 06/19/2023 PCP: Jarrett Soho, PA-C     Brief Narrative:   Kimberly Beck is an 77 y.o. female past medical history of recently treated C. difficile colitis with a last dose on 06/16/2023 history of lung cancer essential hypertension comes in for 4 pain, right sided chest pain, nausea and vomiting with cough and shortness of breath.   Assessment/Plan:   Intractable nausea vomiting and abdominal pain probable hospital-acquired pneumonia: She was started empirically on Rocephin and azithromycin. Blood cultures were ordered, leukocytosis improved her white count is improving. SARS-CoV-2 and influenza PCR and RSV are negative. She relates she feels better still some nausea. Keep her on a full diet Crease famotidine for twice a day continue Zofran.  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 KVO IV fluids now she is not having nausea and vomiting will allow a diet.  Essential HTN (hypertension) Diuretic and ACE inhibitor were held in the setting of acute kidney injury. Blood pressure has remained relatively stable.  Sinus tachycardia Likely due to hypovolemia resolved with IV fluids.    DVT prophylaxis: lovenox Family Communication:non Status is: Inpatient Remains inpatient appropriate because: Pneumonia    Code Status:     Code Status Orders  (From admission, onward)           Start     Ordered   06/19/23 1741  Full code  Continuous       Question:  By:  Answer:  Consent: discussion documented in EHR   06/19/23 1742           Code Status History     Date Active Date Inactive Code Status Order ID Comments User Context   06/03/2023 0329 06/05/2023 2008 Full Code [id number]  Gery Pray, MD ED   05/17/2023 0452 05/22/2023 2342 Full Code  [id number]  Gery Pray, MD ED   07/29/2020 1535 07/30/2020 1909 Full Code [id number]  Ardelle Balls, PA-C Inpatient   04/29/2020 1650 05/01/2020 1647 Full Code [id number]  Leary Roca, PA-C Inpatient         IV Access:   Peripheral IV   Procedures and diagnostic studies:   DG Chest Portable 1 View Result Date: 06/19/2023 CLINICAL DATA:  Leukocytosis EXAM: PORTABLE CHEST 1 VIEW COMPARISON:  05/17/2023 FINDINGS: The heart size and mediastinal contours are within normal limits. Aortic atherosclerosis. Streaky right basilar opacity. Band-like left basilar opacity. Upper lung fields are clear. No pleural effusion or pneumothorax. The visualized skeletal structures are unremarkable. IMPRESSION: Streaky right basilar opacity, atelectasis versus pneumonia. Electronically Signed   By: Duanne Guess D.O.   On: 06/19/2023 19:29   CT ABDOMEN PELVIS W CONTRAST Result Date: 06/19/2023 CLINICAL DATA:  Abdominal pain, acute, nonlocalized EXAM: CT ABDOMEN AND PELVIS WITH CONTRAST TECHNIQUE: Multidetector CT imaging of the abdomen and pelvis was performed using the standard protocol following bolus administration of intravenous contrast. RADIATION DOSE REDUCTION: This exam was performed according to the departmental dose-optimization program which includes automated exposure control, adjustment of the mA and/or kV according to patient size and/or use of iterative reconstruction technique. CONTRAST:  75mL OMNIPAQUE IOHEXOL 350 MG/ML SOLN COMPARISON:  06/02/2023 FINDINGS: Lower chest: Trace right pleural effusion. Patchy right lower lobe airspace opacity. Minimal left basilar subsegmental atelectasis. Heart size is normal. Aortic and coronary artery atherosclerosis. Hepatobiliary: Scattered low-density lesions  within the liver are stable in appearance from prior, likely cysts. Unremarkable gallbladder. No hyperdense gallstone. No biliary dilatation. Pancreas: Unremarkable. No pancreatic ductal  dilatation or surrounding inflammatory changes. Spleen: Normal in size without focal abnormality. Adrenals/Urinary Tract: Unremarkable adrenal glands. Kidneys enhance symmetrically without focal lesion, stone, or hydronephrosis. Ureters are nondilated. Urinary bladder appears unremarkable for the degree of distention. Stomach/Bowel: Small hiatal hernia. Stomach otherwise unremarkable. No dilated loops of bowel to suggest obstruction. Normal appendix in the right hemiabdomen (series 6, image 41). Scattered colonic diverticulosis. No focally inflamed diverticulum. Mild long segment wall thickening of the descending and sigmoid colon, improving compared to the prior study. Vascular/Lymphatic: Aortic atherosclerosis. No enlarged abdominal or pelvic lymph nodes. Reproductive: Uterus within normal limits. 1.4 cm simple right adnexal cyst. No follow-up imaging recommended. Note: This recommendation does not apply to premenarchal patients and to those with increased risk (genetic, family history, elevated tumor markers or other high-risk factors) of ovarian cancer. Reference: JACR 2020 Feb; 17(2):248-254. Unremarkable left adnexum. Other: No free fluid. No abdominopelvic fluid collection. No pneumoperitoneum. No abdominal wall hernia. Musculoskeletal: Chronic compression deformities of the T12 and L1 vertebral bodies, unchanged. No acute bony abnormality. IMPRESSION: 1. Mild colitis involving the descending and sigmoid colon, improving compared to the prior study. 2. Colonic diverticulosis without evidence of acute diverticulitis. 3. Trace right pleural effusion. Patchy right lower lobe airspace opacity, which may represent atelectasis or pneumonia. 4. Small hiatal hernia. 5. Aortic and coronary artery atherosclerosis (ICD10-I70.0). Electronically Signed   By: Duanne Guess D.O.   On: 06/19/2023 16:36     Medical Consultants:   None.   Subjective:    [name] relates she feels better but still  nauseated.  Objective:    Vitals:   06/19/23 1954 06/19/23 2220 06/20/23 0038 06/20/23 0423  BP: (!) 145/61 (!) 145/61 135/63 (!) 149/71  Pulse: 100 100 88 91  Resp: 17 17 18 18   Temp: 98.6 F (37 C) 98.6 F (37 C) 98.8 F (37.1 C) 99 F (37.2 C)  TempSrc: Oral Oral Oral Oral  SpO2: 98%  96% 95%  Weight:  66.4 kg    Height:  5\' 6"  (1.676 m)     SpO2: 95 %   Intake/Output Summary (Last 24 hours) at 06/20/2023 0640 Last data filed at 06/20/2023 0314 Gross per 24 hour  Intake 3120.39 ml  Output 400 ml  Net 2720.39 ml   Filed Weights   06/19/23 2220  Weight: 66.4 kg    Exam: General exam: In no acute distress. Respiratory system: Good air movement and clear to auscultation. Cardiovascular system: S1 & S2 heard, RRR. No JVD. Gastrointestinal system: Abdomen is nondistended, soft and nontender.  Extremities: No pedal edema. Skin: No rashes, lesions or ulcers Psychiatry: Judgement and insight appear normal. Mood & affect appropriate.    Data Reviewed:    Labs: Basic Metabolic Panel: Recent Labs  Lab 06/19/23 0933 06/19/23 1844  NA 138  --   K 2.9*  --   CL 98  --   CO2 22  --   GLUCOSE 152*  --   BUN 15  --   CREATININE 1.05* 0.94  CALCIUM 8.9  --    GFR Estimated Creatinine Clearance: 47.7 mL/min (by C-G formula based on SCr of 0.94 mg/dL). Liver Function Tests: Recent Labs  Lab 06/19/23 0933  AST 16  ALT 9  ALKPHOS 106  BILITOT 1.6*  PROT 6.7  ALBUMIN 3.0*   Recent Labs  Lab  06/19/23 0933  LIPASE 25   No results for input(s): "AMMONIA" in the last 168 hours. Coagulation profile No results for input(s): "INR", "PROTIME" in the last 168 hours. COVID-19 Labs  No results for input(s): "DDIMER", "FERRITIN", "LDH", "CRP" in the last 72 hours.  Lab Results  Component Value Date   SARSCOV2NAA NEGATIVE 06/19/2023   SARSCOV2NAA NEGATIVE 07/27/2020   SARSCOV2NAA NEGATIVE 04/27/2020    CBC: Recent Labs  Lab 06/19/23 0933 06/19/23 1844   WBC 19.1* 15.8*  HGB 13.1 11.3*  HCT 40.8 35.7*  MCV 90.1 91.5  PLT 254 204   Cardiac Enzymes: No results for input(s): "CKTOTAL", "CKMB", "CKMBINDEX", "TROPONINI" in the last 168 hours. BNP (last 3 results) No results for input(s): "PROBNP" in the last 8760 hours. CBG: No results for input(s): "GLUCAP" in the last 168 hours. D-Dimer: No results for input(s): "DDIMER" in the last 72 hours. Hgb A1c: No results for input(s): "HGBA1C" in the last 72 hours. Lipid Profile: No results for input(s): "CHOL", "HDL", "LDLCALC", "TRIG", "CHOLHDL", "LDLDIRECT" in the last 72 hours. Thyroid function studies: No results for input(s): "TSH", "T4TOTAL", "T3FREE", "THYROIDAB" in the last 72 hours.  Invalid input(s): "FREET3" Anemia work up: No results for input(s): "VITAMINB12", "FOLATE", "FERRITIN", "TIBC", "IRON", "RETICCTPCT" in the last 72 hours. Sepsis Labs: Recent Labs  Lab 06/19/23 0933 06/19/23 1844  WBC 19.1* 15.8*   Microbiology Recent Results (from the past 240 hours)  Resp panel by RT-PCR (RSV, Flu A&B, Covid) Anterior Nasal Swab     Status: None   Collection Time: 06/19/23  6:48 PM   Specimen: Anterior Nasal Swab  Result Value Ref Range Status   SARS Coronavirus 2 by RT PCR NEGATIVE NEGATIVE Final   Influenza A by PCR NEGATIVE NEGATIVE Final   Influenza B by PCR NEGATIVE NEGATIVE Final    Comment: (NOTE) The Xpert Xpress SARS-CoV-2/FLU/RSV plus assay is intended as an aid in the diagnosis of influenza from Nasopharyngeal swab specimens and should not be used as a sole basis for treatment. Nasal washings and aspirates are unacceptable for Xpert Xpress SARS-CoV-2/FLU/RSV testing.  Fact Sheet for Patients: BloggerCourse.com  Fact Sheet for Healthcare Providers: SeriousBroker.it  This test is not yet approved or cleared by the [place] FDA and has been authorized for detection and/or diagnosis of SARS-CoV-2 by FDA  under an Emergency Use Authorization (EUA). This EUA will remain in effect (meaning this test can be used) for the duration of the COVID-19 declaration under Section 564(b)(1) of the Act, 21 U.S.C. section 360bbb-3(b)(1), unless the authorization is terminated or revoked.     Resp Syncytial Virus by PCR NEGATIVE NEGATIVE Final    Comment: (NOTE) Fact Sheet for Patients: BloggerCourse.com  Fact Sheet for Healthcare Providers: SeriousBroker.it  This test is not yet approved or cleared by the [place] FDA and has been authorized for detection and/or diagnosis of SARS-CoV-2 by FDA under an Emergency Use Authorization (EUA). This EUA will remain in effect (meaning this test can be used) for the duration of the COVID-19 declaration under Section 564(b)(1) of the Act, 21 U.S.C. section 360bbb-3(b)(1), unless the authorization is terminated or revoked.  Performed at [facility], [address] [place], [place] [address]      Medications:    aspirin EC  81 mg Oral Daily   enoxaparin (LOVENOX) injection  40 mg Subcutaneous Q24H   escitalopram  10 mg Oral Daily   famotidine  20 mg Oral Daily   metoprolol succinate  25  mg Oral Daily   rosuvastatin  5 mg Oral Daily   vancomycin  125 mg Oral Q6H   Continuous Infusions:  0.9 % NaCl with KCl 20 mEq / L 100 mL/hr at 06/20/23 0314   azithromycin Stopped (06/19/23 2128)   cefTRIAXone (ROCEPHIN)  IV        LOS: 1 day   Marinda Elk  [facility]  06/20/2023, 6:40 AM

## 2023-06-20 NOTE — Evaluation (Signed)
Occupational Therapy Evaluation Patient Details Name: Kimberly Beck MRN: [id number] DOB: 11/29/1945 Today's Date: 06/20/2023   History of Present Illness Pt is 77 yo female who presents 06/19/23 after  finished her C. difficile treatment she started having abdominal pain and persistent nausea and vomiting; CT scan of the abdomen pelvis showed mild colitis and trace right-sided pleural effusion; probable pna; AKI;  PMH: lung cancer, DVT, L1 compression fracture, back pain, hypertension, hyperlipidemia, recent fall with right proximal humerus fracture 05/14/23   Clinical Impression   Pt reports receiving assistance as needed with ADLs and IADLs from her boyfriend prior to readmission since her fll in which she fractured her R proximal humerus. She was participating in [facility]. Presents with generalized weakness and decreased activity tolerance, she attributes to not having eaten in 3 days due to nausea and vomiting. Ambulates with CGA and completes ADLs with set up to moderate assistance. Recommend resumption of HHOT upon discharge.       If plan is discharge home, recommend the following: A lot of help with bathing/dressing/bathroom;Assistance with cooking/housework;Assist for transportation;Help with stairs or ramp for entrance    Functional Status Assessment  Patient has had a recent decline in their functional status and demonstrates the ability to make significant improvements in function in a reasonable and predictable amount of time.  Equipment Recommendations  None recommended by OT    Recommendations for Other Services       Precautions / Restrictions Precautions Precautions: Fall Required Braces or Orthoses: Sling Restrictions Weight Bearing Restrictions Per Provider Order: Yes RUE Weight Bearing Per Provider Order: Non weight bearing      Mobility Bed Mobility Overal bed mobility: Modified Independent             General bed mobility comments: maintains NWB RUE,  HOB up    Transfers Overall transfer level: Needs assistance Equipment used: None Transfers: Sit to/from Stand Sit to Stand: Supervision           General transfer comment: supervision for safety and lines      Balance Overall balance assessment: Needs assistance   Sitting balance-Leahy Scale: Good     Standing balance support: No upper extremity supported, During functional activity Standing balance-Leahy Scale: Fair                             ADL either performed or assessed with clinical judgement   ADL Overall ADL's : Needs assistance/impaired Eating/Feeding: Set up;Bed level   Grooming: Wash/dry hands;Supervision/safety;Standing   Upper Body Bathing: Minimal assistance;Sitting   Lower Body Bathing: Moderate assistance;Sit to/from stand   Upper Body Dressing : Moderate assistance;Sitting   Lower Body Dressing: Moderate assistance;Sit to/from stand   Toilet Transfer: Ambulation;Contact guard assist   Toileting- Clothing Manipulation and Hygiene: Set up;Sitting/lateral lean       Functional mobility during ADLs: Contact guard assist       Vision Ability to See in Adequate Light: 0 Adequate Patient Visual Report: No change from baseline       Perception         Praxis         Pertinent Vitals/Pain Pain Assessment Pain Assessment: Faces Faces Pain Scale: Hurts a little bit Pain Location: RUE Pain Descriptors / Indicators: Guarding Pain Intervention(s): Monitored during session, Repositioned     Extremity/Trunk Assessment Upper Extremity Assessment Upper Extremity Assessment: Left hand dominant;RUE deficits/detail RUE Deficits / Details: proximal humerus fx in sling, full  ROM in hand, wrist and forearm RUE Coordination: decreased gross motor   Lower Extremity Assessment Lower Extremity Assessment: Defer to PT evaluation   Cervical / Trunk Assessment Cervical / Trunk Assessment: Kyphotic   Communication  Communication Communication: No apparent difficulties   Cognition Arousal: Alert Behavior During Therapy: WFL for tasks assessed/performed Overall Cognitive Status: Within Functional Limits for tasks assessed                                       General Comments  HR 87-121 with ambulation    Exercises     Shoulder Instructions      Home Living Family/patient expects to be discharged to:: Private residence Living Arrangements: Spouse/significant other (boyfriend) Available Help at Discharge: Family;Available PRN/intermittently Type of Home: House Home Access: Level entry     Home Layout: One level     Bathroom Shower/Tub: Tub/shower unit;Walk-in shower   Bathroom Toilet: Standard Bathroom Accessibility: Yes   Home Equipment: Hospital bed;Wheelchair - Biomedical scientist (2 wheels);Cane - single point;BSC/3in1;Lift chair   Additional Comments: pt reports that significant other is older than her and has Parkinsons and cannot assist her physically      Prior Functioning/Environment Prior Level of Function : Needs assist;History of Falls (last six months)             Mobility Comments: reports her fall was due to slipping on a piece of cardboard on her floor; no falls since, has been walking unaided in house ADLs Comments: significant other helps as able, ADL's have been getting harder, has home OT        OT Problem List: Decreased activity tolerance;Pain;Impaired UE functional use;Decreased knowledge of precautions      OT Treatment/Interventions: Self-care/ADL training;Patient/family education;Therapeutic activities    OT Goals(Current goals can be found in the care plan section) Acute Rehab OT Goals OT Goal Formulation: With patient Time For Goal Achievement: 07/04/23 Potential to Achieve Goals: Good ADL Goals Pt Will Perform Grooming: with modified independence;standing Pt Will Perform Upper Body Bathing: with modified  independence;sitting Pt Will Perform Upper Body Dressing: with modified independence;sitting Pt Will Perform Lower Body Dressing: with modified independence;sit to/from stand Pt Will Transfer to Toilet: with modified independence;ambulating;regular height toilet Pt Will Perform Toileting - Clothing Manipulation and hygiene: with modified independence;sit to/from stand  OT Frequency: Min 1X/week    Co-evaluation              AM-PAC OT "6 Clicks" Daily Activity     Outcome Measure Help from another person eating meals?: A Little Help from another person taking care of personal grooming?: A Little Help from another person toileting, which includes using toliet, bedpan, or urinal?: A Little Help from another person bathing (including washing, rinsing, drying)?: A Lot Help from another person to put on and taking off regular upper body clothing?: A Lot Help from another person to put on and taking off regular lower body clothing?: A Lot 6 Click Score: 15   End of Session Equipment Utilized During Treatment: Other (comment) (sling) Nurse Communication: Other (comment) (cannot take miralax in water)  Activity Tolerance: Patient tolerated treatment well Patient left: in bed;with call bell/phone within reach;with bed alarm set  OT Visit Diagnosis: Pain;Muscle weakness (generalized) (M62.81);History of falling (Z91.81) Pain - Right/Left: Right Pain - part of body: Shoulder  Time: 1610-9604 OT Time Calculation (min): 21 min Charges:  OT General Charges $OT Visit: 1 Visit OT Evaluation $OT Eval Moderate Complexity: 1 Mod Berna Spare, OTR/L Acute Rehabilitation Services Office: [phone]  Evern Bio 06/20/2023, 11:30 AM

## 2023-06-20 NOTE — Plan of Care (Signed)

## 2023-06-20 NOTE — Evaluation (Signed)
Physical Therapy Evaluation Patient Details Name: Kimberly Beck MRN: [id number] DOB: [birth year] Today's Date: 06/20/2023  History of Present Illness  Pt is 77 yo female who presents 06/19/23 after  finished her C. difficile treatment she started having abdominal pain and persistent nausea and vomiting; CT scan of the abdomen pelvis showed mild colitis and trace right-sided pleural effusion; probable pna; AKI;  PMH: lung cancer, DVT, L1 compression fracture, back pain, hypertension, hyperlipidemia, recent fall with right proximal humerus fracture 05/14/23  Clinical Impression   Pt admitted secondary to problem above with deficits below. PTA patient was living with boyfriend and walking without a device. Denies falls since the time she slipped on the cardboard and broke her arm.  Pt currently requires CGA for ambulation due to reported fatigue with recent N/V and inability to eat.  Anticipate patient will benefit from PT to address problems listed below. Will continue to follow acutely to maximize functional mobility independence and safety.  Anticipate no PT needs on discharge.         If plan is discharge home, recommend the following: A little help with bathing/dressing/bathroom;Assist for transportation;Assistance with cooking/housework;Help with stairs or ramp for entrance   Can travel by private vehicle   Yes    Equipment Recommendations None recommended by PT  Recommendations for Other Services       Functional Status Assessment Patient has had a recent decline in their functional status and demonstrates the ability to make significant improvements in function in a reasonable and predictable amount of time.     Precautions / Restrictions Precautions Precautions: Fall Required Braces or Orthoses: Sling Restrictions Weight Bearing Restrictions Per Provider Order: Yes RUE Weight Bearing Per Provider Order: Non weight bearing      Mobility  Bed Mobility Overal bed  mobility: Modified Independent Bed Mobility: Supine to Sit, Sit to Supine     Supine to sit: Modified independent (Device/Increase time) Sit to supine: Modified independent (Device/Increase time)   General bed mobility comments: maintains NWB RUE    Transfers Overall transfer level: Needs assistance Equipment used: None Transfers: Sit to/from Stand, Bed to chair/wheelchair/BSC Sit to Stand: Supervision   Step pivot transfers: Supervision       General transfer comment: supervision within session, continues to use LUE to power up    Ambulation/Gait Ambulation/Gait assistance: Contact guard assist Gait Distance (Feet): 60 Feet Assistive device: None Gait Pattern/deviations: Step-through pattern, Trunk flexed, Decreased stride length Gait velocity: decreased     General Gait Details: pt reports very fatigued and weak due to dehydration and lack of nutrition x 4 days (N/V)  Stairs            Wheelchair Mobility     Tilt Bed    Modified Rankin (Stroke Patients Only)       Balance Overall balance assessment: Needs assistance Sitting-balance support: No upper extremity supported, Feet supported Sitting balance-Leahy Scale: Good     Standing balance support: No upper extremity supported, During functional activity Standing balance-Leahy Scale: Fair Standing balance comment: no UE support                             Pertinent Vitals/Pain Pain Assessment Pain Assessment: Faces Faces Pain Scale: Hurts a little bit Pain Location: RUE Pain Descriptors / Indicators: Guarding Pain Intervention(s): Limited activity within patient's tolerance, Monitored during session    Home Living Family/patient expects to be discharged to:: Private residence Living Arrangements: Spouse/significant  other Available Help at Discharge: Family;Available PRN/intermittently Type of Home: House Home Access: Level entry       Home Layout: One level Home Equipment:  Hospital bed;Wheelchair - Biomedical scientist (2 wheels);Cane - single point;BSC/3in1;Lift chair Additional Comments: pt reports that significant other is older than her and has Parkinsons and cannot assist her physically    Prior Function Prior Level of Function : Needs assist;History of Falls (last six months)             Mobility Comments: reports her fall was due to slipping on a piece of cardboard on her floor; no falls since (No change since previous admission) ADLs Comments: significant other helps as able, ADL's have been getting harder     Extremity/Trunk Assessment   Upper Extremity Assessment RUE Deficits / Details: non operative proximal shoulder fracture; sling readjusted to accomodate gown    Lower Extremity Assessment Lower Extremity Assessment: Generalized weakness    Cervical / Trunk Assessment Cervical / Trunk Assessment: Kyphotic  Communication   Communication Communication: No apparent difficulties  Cognition Arousal: Alert Behavior During Therapy: WFL for tasks assessed/performed Overall Cognitive Status: Within Functional Limits for tasks assessed                                 General Comments: Very pleasant and talkative.        General Comments General comments (skin integrity, edema, etc.): HR 87-121 with ambulation    Exercises     Assessment/Plan    PT Assessment Patient needs continued PT services  PT Problem List Decreased strength;Decreased activity tolerance;Decreased balance;Decreased mobility;Decreased coordination;Decreased knowledge of use of DME;Decreased knowledge of precautions;Pain       PT Treatment Interventions DME instruction;Gait training;Functional mobility training;Therapeutic activities;Therapeutic exercise;Balance training;Neuromuscular re-education;Patient/family education    PT Goals (Current goals can be found in the Care Plan section)  Acute Rehab PT Goals Patient Stated Goal: to  be able to stand up from toilet/char without use of her arms PT Goal Formulation: With patient Time For Goal Achievement: 07/04/23 Potential to Achieve Goals: Good    Frequency Min 1X/week     Co-evaluation               AM-PAC PT "6 Clicks" Mobility  Outcome Measure Help needed turning from your back to your side while in a flat bed without using bedrails?: None Help needed moving from lying on your back to sitting on the side of a flat bed without using bedrails?: None Help needed moving to and from a bed to a chair (including a wheelchair)?: A Little Help needed standing up from a chair using your arms (e.g., wheelchair or bedside chair)?: A Little Help needed to walk in hospital room?: A Little Help needed climbing 3-5 steps with a railing? : A Little 6 Click Score: 20    End of Session Equipment Utilized During Treatment: Gait belt Activity Tolerance: Patient limited by fatigue Patient left: in bed;with call bell/phone within reach;with bed alarm set Nurse Communication: Mobility status;Other (comment) (refused up in chair due to back pain (attempted)) PT Visit Diagnosis: Unsteadiness on feet (R26.81);History of falling (Z91.81);Muscle weakness (generalized) (M62.81);Pain;Difficulty in walking, not elsewhere classified (R26.2) Pain - Right/Left: Right Pain - part of body: Shoulder    Time: 4098-1191 PT Time Calculation (min) (ACUTE ONLY): 33 min   Charges:   PT Evaluation $PT Eval Low Complexity: 1 Low PT Treatments $Gait Training: 8-22  mins PT General Charges $$ ACUTE PT VISIT: 1 Visit          Jerolyn Center, PT Acute Rehabilitation Services  Office [phone]   Zena Amos 06/20/2023, 9:50 AM

## 2023-06-20 NOTE — Progress Notes (Signed)
Transition of Care [facility]) - Inpatient Brief Assessment   Patient Details  Name: Kimberly Beck MRN: [id number] Date of Birth: 1946/03/27  Transition of Care [facility]) CM/SW Contact:    Janae Bridgeman, RN Phone Number: 06/20/2023, 4:35 PM   Clinical Narrative: CM met with the patient at the bedside to discuss TOC needs.  The patient is currently staying with her boyfriend's home at [address], [place], [place].  She plans to return to his home when she is medically stable.  When offered home health services -she states that she is currently active with [facility] for PT/OT.  Frances Furbish was called and he is aware that patient was admitted to the hospital to re-started services when she is discharged.  HH orders placed to be co-signed by MD.  Message sent to provider.  Patient has Right arm sling and states that she does not use DME at home.  No other TOC needs at this time.   Transition of Care Asessment: Insurance and Status: (P) Insurance coverage has been reviewed Patient has primary care physician: (P) Yes Home environment has been reviewed: (P) from home with boyfriend Prior level of function:: (P) Independent with help from boyfriend Prior/Current Home Services: (P) Current home services (Active with [facility]) Social Drivers of Health Review: (P) SDOH reviewed interventions complete Readmission risk has been reviewed: (P) Yes Transition of care needs: (P) transition of care needs identified, TOC will continue to follow

## 2023-06-21 DIAGNOSIS — J189 Pneumonia, unspecified organism: Secondary | ICD-10-CM | POA: Diagnosis not present

## 2023-06-21 DIAGNOSIS — R112 Nausea with vomiting, unspecified: Secondary | ICD-10-CM | POA: Diagnosis not present

## 2023-06-21 DIAGNOSIS — R109 Unspecified abdominal pain: Secondary | ICD-10-CM | POA: Diagnosis not present

## 2023-06-21 NOTE — Progress Notes (Signed)
[facility] PROGRESS NOTE    Progress Note  Kimberly Beck  VOZ:[id number] DOB: [birth year] DOA: 06/19/2023 PCP: Jarrett Soho, PA-C     Brief Narrative:   Kimberly Beck is an 77 y.o. female past medical history of recently treated C. difficile colitis with a last dose on 06/16/2023 history of lung cancer essential hypertension comes in for 4 pain, right sided chest pain, nausea and vomiting with cough and shortness of breath.   Assessment/Plan:   Intractable nausea vomiting and abdominal pain probable hospital-acquired pneumonia: She was started empirically on Rocephin and azithromycin. Blood cultures ID reflex panel was positive for staph as urine afebrile leukocytosis improving. She is told rating her diet. Continue famotidine for twice a day continue Zofran. She is tolerating her diet will advance to regular diet. PT evaluated the patient recommended home health PT.  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 KVO IV fluids now she is not having nausea and vomiting will allow a diet.  Essential HTN (hypertension) Diuretic and ACE inhibitor were held in the setting of acute kidney injury. Blood pressure has remained relatively stable.  Sinus tachycardia Likely due to hypovolemia resolved with IV fluids.  History of C. difficile colitis: Treating her with oral vancomycin for prophylaxis as she is currently on antibiotics for pneumonia.   DVT prophylaxis: lovenox Family Communication:non Status is: Inpatient Remains inpatient appropriate because: Pneumonia    Code Status:     Code Status Orders  (From admission, onward)           Start     Ordered   06/19/23 1741  Full code  Continuous       Question:  By:  Answer:  Consent: discussion documented in EHR   06/19/23 1742           Code Status History     Date Active Date Inactive  Code Status Order ID Comments User Context   06/03/2023 0329 06/05/2023 2008 Full Code [id number]  Gery Pray, MD ED   05/17/2023 0452 05/22/2023 2342 Full Code [id number]  Gery Pray, MD ED   07/29/2020 1535 07/30/2020 1909 Full Code [id number]  Ardelle Balls, PA-C Inpatient   04/29/2020 1650 05/01/2020 1647 Full Code [id number]  Leary Roca, PA-C Inpatient         IV Access:   Peripheral IV   Procedures and diagnostic studies:   DG Chest Portable 1 View Result Date: 06/19/2023 CLINICAL DATA:  Leukocytosis EXAM: PORTABLE CHEST 1 VIEW COMPARISON:  05/17/2023 FINDINGS: The heart size and mediastinal contours are within normal limits. Aortic atherosclerosis. Streaky right basilar opacity. Band-like left basilar opacity. Upper lung fields are clear. No pleural effusion or pneumothorax. The visualized skeletal structures are unremarkable. IMPRESSION: Streaky right basilar opacity, atelectasis versus pneumonia. Electronically Signed   By: Duanne Guess D.O.   On: 06/19/2023 19:29   CT ABDOMEN PELVIS W CONTRAST Result Date: 06/19/2023 CLINICAL DATA:  Abdominal pain, acute, nonlocalized EXAM: CT ABDOMEN AND PELVIS WITH CONTRAST TECHNIQUE: Multidetector CT imaging of the abdomen and pelvis was performed using the standard protocol following bolus administration of intravenous contrast. RADIATION DOSE REDUCTION: This exam was performed according to the departmental dose-optimization program which includes automated exposure control, adjustment of the mA and/or kV according to patient size and/or use of iterative reconstruction technique. CONTRAST:  75mL OMNIPAQUE IOHEXOL 350 MG/ML SOLN COMPARISON:  06/02/2023 FINDINGS: Lower chest: Trace right pleural  effusion. Patchy right lower lobe airspace opacity. Minimal left basilar subsegmental atelectasis. Heart size is normal. Aortic and coronary artery atherosclerosis. Hepatobiliary: Scattered low-density lesions within the liver are  stable in appearance from prior, likely cysts. Unremarkable gallbladder. No hyperdense gallstone. No biliary dilatation. Pancreas: Unremarkable. No pancreatic ductal dilatation or surrounding inflammatory changes. Spleen: Normal in size without focal abnormality. Adrenals/Urinary Tract: Unremarkable adrenal glands. Kidneys enhance symmetrically without focal lesion, stone, or hydronephrosis. Ureters are nondilated. Urinary bladder appears unremarkable for the degree of distention. Stomach/Bowel: Small hiatal hernia. Stomach otherwise unremarkable. No dilated loops of bowel to suggest obstruction. Normal appendix in the right hemiabdomen (series 6, image 41). Scattered colonic diverticulosis. No focally inflamed diverticulum. Mild long segment wall thickening of the descending and sigmoid colon, improving compared to the prior study. Vascular/Lymphatic: Aortic atherosclerosis. No enlarged abdominal or pelvic lymph nodes. Reproductive: Uterus within normal limits. 1.4 cm simple right adnexal cyst. No follow-up imaging recommended. Note: This recommendation does not apply to premenarchal patients and to those with increased risk (genetic, family history, elevated tumor markers or other high-risk factors) of ovarian cancer. Reference: JACR 2020 Feb; 17(2):248-254. Unremarkable left adnexum. Other: No free fluid. No abdominopelvic fluid collection. No pneumoperitoneum. No abdominal wall hernia. Musculoskeletal: Chronic compression deformities of the T12 and L1 vertebral bodies, unchanged. No acute bony abnormality. IMPRESSION: 1. Mild colitis involving the descending and sigmoid colon, improving compared to the prior study. 2. Colonic diverticulosis without evidence of acute diverticulitis. 3. Trace right pleural effusion. Patchy right lower lobe airspace opacity, which may represent atelectasis or pneumonia. 4. Small hiatal hernia. 5. Aortic and coronary artery atherosclerosis (ICD10-I70.0). Electronically Signed   By:  Duanne Guess D.O.   On: 06/19/2023 16:36     Medical Consultants:   None.   Subjective:    Kimberly Beck tolerating her diet.  2 watery bowel movements.  Objective:    Vitals:   06/20/23 2002 06/20/23 2308 06/21/23 0414 06/21/23 0744  BP: 134/67 133/70 (!) 146/61 (!) 157/71  Pulse: 95 96 90 84  Resp: 18 17 17 16   Temp: 98.2 F (36.8 C) 98.4 F (36.9 C) 98.3 F (36.8 C) 98.5 F (36.9 C)  TempSrc: Oral Oral Oral Oral  SpO2: 96% 95% 96% 97%  Weight:      Height:       SpO2: 97 %  No intake or output data in the 24 hours ending 06/21/23 1212  Filed Weights   06/19/23 2220  Weight: 66.4 kg    Exam: General exam: In no acute distress. Respiratory system: Good air movement and clear to auscultation. Cardiovascular system: S1 & S2 heard, RRR. No JVD. Gastrointestinal system: Abdomen is nondistended, soft and nontender.  Extremities: No pedal edema. Skin: No rashes, lesions or ulcers Psychiatry: Judgement and insight appear normal. Mood & affect appropriate. Data Reviewed:    Labs: Basic Metabolic Panel: Recent Labs  Lab 06/19/23 0933 06/19/23 1844 06/20/23 0631  NA 138  --  141  K 2.9*  --  3.2*  CL 98  --  110  CO2 22  --  21*  GLUCOSE 152*  --  102*  BUN 15  --  12  CREATININE 1.05* 0.94 0.75  CALCIUM 8.9  --  7.2*   GFR Estimated Creatinine Clearance: 56 mL/min (by C-G formula based on SCr of 0.75 mg/dL). Liver Function Tests: Recent Labs  Lab 06/19/23 0933  AST 16  ALT 9  ALKPHOS 106  BILITOT 1.6*  PROT 6.7  ALBUMIN 3.0*   Recent Labs  Lab 06/19/23 0933  LIPASE 25   No results for input(s): "AMMONIA" in the last 168 hours. Coagulation profile No results for input(s): "INR", "PROTIME" in the last 168 hours. COVID-19 Labs  No results for input(s): "DDIMER", "FERRITIN", "LDH", "CRP" in the last 72 hours.  Lab Results  Component Value Date   SARSCOV2NAA NEGATIVE 06/19/2023   SARSCOV2NAA NEGATIVE 07/27/2020   SARSCOV2NAA  NEGATIVE 04/27/2020    CBC: Recent Labs  Lab 06/19/23 0933 06/19/23 1844 06/20/23 0631  WBC 19.1* 15.8* 12.6*  HGB 13.1 11.3* 10.2*  HCT 40.8 35.7* 32.4*  MCV 90.1 91.5 91.8  PLT 254 204 217   Cardiac Enzymes: No results for input(s): "CKTOTAL", "CKMB", "CKMBINDEX", "TROPONINI" in the last 168 hours. BNP (last 3 results) No results for input(s): "PROBNP" in the last 8760 hours. CBG: No results for input(s): "GLUCAP" in the last 168 hours. D-Dimer: No results for input(s): "DDIMER" in the last 72 hours. Hgb A1c: No results for input(s): "HGBA1C" in the last 72 hours. Lipid Profile: No results for input(s): "CHOL", "HDL", "LDLCALC", "TRIG", "CHOLHDL", "LDLDIRECT" in the last 72 hours. Thyroid function studies: No results for input(s): "TSH", "T4TOTAL", "T3FREE", "THYROIDAB" in the last 72 hours.  Invalid input(s): "FREET3" Anemia work up: No results for input(s): "VITAMINB12", "FOLATE", "FERRITIN", "TIBC", "IRON", "RETICCTPCT" in the last 72 hours. Sepsis Labs: Recent Labs  Lab 06/19/23 0933 06/19/23 1844 06/20/23 0631  WBC 19.1* 15.8* 12.6*   Microbiology Recent Results (from the past 240 hours)  Blood culture (routine x 2)     Status: None (Preliminary result)   Collection Time: 06/19/23  6:38 PM   Specimen: BLOOD LEFT HAND  Result Value Ref Range Status   Specimen Description BLOOD LEFT HAND  Final   Special Requests   Final    BOTTLES DRAWN AEROBIC ONLY Blood Culture results may not be optimal due to an inadequate volume of blood received in culture bottles   Culture   Final    NO GROWTH 2 DAYS Performed at [facility], [address], [place], [place] [address]    Report Status PENDING  Incomplete  Blood culture (routine x 2)     Status: None (Preliminary result)   Collection Time: 06/19/23  6:44 PM   Specimen: BLOOD RIGHT HAND  Result Value Ref Range Status   Specimen Description BLOOD RIGHT HAND  Final   Special Requests   Final    BOTTLES  DRAWN AEROBIC AND ANAEROBIC Blood Culture adequate volume   Culture  Setup Time   Final    GRAM POSITIVE COCCI AEROBIC BOTTLE ONLY CRITICAL RESULT CALLED TO, READ BACK BY AND VERIFIED WITH: PHARMD EMMA PAYTES ON 06/20/23 @ 2115 BY DRT    Culture   Final    GRAM POSITIVE COCCI IDENTIFICATION TO FOLLOW Performed at [facility], [address] [place], [place] [address]    Report Status PENDING  Incomplete  Blood Culture ID Panel (Reflexed)     Status: Abnormal   Collection Time: 06/19/23  6:44 PM  Result Value Ref Range Status   Enterococcus faecalis NOT DETECTED NOT DETECTED Final   Enterococcus Faecium NOT DETECTED NOT DETECTED Final   Listeria monocytogenes NOT DETECTED NOT DETECTED Final   Staphylococcus species DETECTED (A) NOT DETECTED Final    Comment: CRITICAL RESULT CALLED TO, READ BACK BY AND VERIFIED WITH: PHARMD EMMA PAYTES ON 06/20/23 @ 2115 BY DRT    Staphylococcus aureus (BCID)  NOT DETECTED NOT DETECTED Final   Staphylococcus epidermidis NOT DETECTED NOT DETECTED Final   Staphylococcus lugdunensis NOT DETECTED NOT DETECTED Final   Streptococcus species NOT DETECTED NOT DETECTED Final   Streptococcus agalactiae NOT DETECTED NOT DETECTED Final   Streptococcus pneumoniae NOT DETECTED NOT DETECTED Final   Streptococcus pyogenes NOT DETECTED NOT DETECTED Final   A.calcoaceticus-baumannii NOT DETECTED NOT DETECTED Final   Bacteroides fragilis NOT DETECTED NOT DETECTED Final   Enterobacterales NOT DETECTED NOT DETECTED Final   Enterobacter cloacae complex NOT DETECTED NOT DETECTED Final   Escherichia coli NOT DETECTED NOT DETECTED Final   Klebsiella aerogenes NOT DETECTED NOT DETECTED Final   Klebsiella oxytoca NOT DETECTED NOT DETECTED Final   Klebsiella pneumoniae NOT DETECTED NOT DETECTED Final   Proteus species NOT DETECTED NOT DETECTED Final   Salmonella species NOT DETECTED NOT DETECTED Final   Serratia marcescens NOT DETECTED NOT DETECTED Final    Haemophilus influenzae NOT DETECTED NOT DETECTED Final   Neisseria meningitidis NOT DETECTED NOT DETECTED Final   Pseudomonas aeruginosa NOT DETECTED NOT DETECTED Final   Stenotrophomonas maltophilia NOT DETECTED NOT DETECTED Final   Candida albicans NOT DETECTED NOT DETECTED Final   Candida auris NOT DETECTED NOT DETECTED Final   Candida glabrata NOT DETECTED NOT DETECTED Final   Candida krusei NOT DETECTED NOT DETECTED Final   Candida parapsilosis NOT DETECTED NOT DETECTED Final   Candida tropicalis NOT DETECTED NOT DETECTED Final   Cryptococcus neoformans/gattii NOT DETECTED NOT DETECTED Final    Comment: Performed at [facility], [address], [place], [place] [address]  Resp panel by RT-PCR (RSV, Flu A&B, Covid) Anterior Nasal Swab     Status: None   Collection Time: 06/19/23  6:48 PM   Specimen: Anterior Nasal Swab  Result Value Ref Range Status   SARS Coronavirus 2 by RT PCR NEGATIVE NEGATIVE Final   Influenza A by PCR NEGATIVE NEGATIVE Final   Influenza B by PCR NEGATIVE NEGATIVE Final    Comment: (NOTE) The Xpert Xpress SARS-CoV-2/FLU/RSV plus assay is intended as an aid in the diagnosis of influenza from Nasopharyngeal swab specimens and should not be used as a sole basis for treatment. Nasal washings and aspirates are unacceptable for Xpert Xpress SARS-CoV-2/FLU/RSV testing.  Fact Sheet for Patients: BloggerCourse.com  Fact Sheet for Healthcare Providers: SeriousBroker.it  This test is not yet approved or cleared by the [place] FDA and has been authorized for detection and/or diagnosis of SARS-CoV-2 by FDA under an Emergency Use Authorization (EUA). This EUA will remain in effect (meaning this test can be used) for the duration of the COVID-19 declaration under Section 564(b)(1) of the Act, 21 U.S.C. section 360bbb-3(b)(1), unless the authorization is terminated or revoked.     Resp Syncytial Virus by  PCR NEGATIVE NEGATIVE Final    Comment: (NOTE) Fact Sheet for Patients: BloggerCourse.com  Fact Sheet for Healthcare Providers: SeriousBroker.it  This test is not yet approved or cleared by the [place] FDA and has been authorized for detection and/or diagnosis of SARS-CoV-2 by FDA under an Emergency Use Authorization (EUA). This EUA will remain in effect (meaning this test can be used) for the duration of the COVID-19 declaration under Section 564(b)(1) of the Act, 21 U.S.C. section 360bbb-3(b)(1), unless the authorization is terminated or revoked.  Performed at [facility], [address], [place], [place] [address]   MRSA Next Gen by PCR, Nasal     Status: None   Collection Time: 06/20/23  8:37 AM   Specimen: Nasal Mucosa; Nasal Swab  Result Value Ref Range Status   MRSA by PCR Next Gen NOT DETECTED NOT DETECTED Final    Comment: (NOTE) The GeneXpert MRSA Assay (FDA approved for NASAL specimens only), is one component of a comprehensive MRSA colonization surveillance program. It is not intended to diagnose MRSA infection nor to guide or monitor treatment for MRSA infections. Test performance is not FDA approved in patients less than 31 years old. Performed at [facility], [address], [place], [place] [address]      Medications:    aspirin EC  81 mg Oral Daily   enoxaparin (LOVENOX) injection  40 mg Subcutaneous Q24H   escitalopram  10 mg Oral Daily   famotidine  40 mg Oral BID   metoprolol succinate  25 mg Oral Daily   pantoprazole  40 mg Oral BID   polyethylene glycol  17 g Oral BID   rosuvastatin  5 mg Oral Daily   vancomycin  125 mg Oral BID   Continuous Infusions:  azithromycin Stopped (06/20/23 2115)   cefTRIAXone (ROCEPHIN)  IV Stopped (06/20/23 2010)      LOS: 2 days   Marinda Elk  [facility]  06/21/2023, 12:12 PM

## 2023-06-21 NOTE — Progress Notes (Signed)
Mobility Specialist Progress Note:    06/21/23 1024  Mobility  Activity Ambulated with assistance in room;Ambulated with assistance in hallway  Level of Assistance Standby assist, set-up cues, supervision of patient - no hands on  Assistive Device None  Distance Ambulated (ft) 120 ft  RUE Weight Bearing Per Provider Order NWB  Activity Response Tolerated well  Mobility Referral Yes  Mobility visit 1 Mobility  Mobility Specialist Start Time (ACUTE ONLY) 1017  Mobility Specialist Stop Time (ACUTE ONLY) 1024  Mobility Specialist Time Calculation (min) (ACUTE ONLY) 7 min   Pt received in bed, agreeable to mobility session. Pt reported weakness in knees. Ambulated in hallway with SBA, no AD required. NWB for RUE. Max HR 138 bpm. Returned pt to room, left with all needs met.   Feliciana Rossetti Mobility Specialist Please contact via Special educational needs teacher or  Rehab office at [phone]

## 2023-06-22 DIAGNOSIS — R112 Nausea with vomiting, unspecified: Secondary | ICD-10-CM | POA: Diagnosis not present

## 2023-06-22 DIAGNOSIS — R109 Unspecified abdominal pain: Secondary | ICD-10-CM | POA: Diagnosis not present

## 2023-06-22 DIAGNOSIS — J189 Pneumonia, unspecified organism: Secondary | ICD-10-CM | POA: Diagnosis not present

## 2023-06-22 LAB — BASIC METABOLIC PANEL
Anion gap: 7 (ref 5–15)
BUN: <5 mg/dL — ABNORMAL LOW (ref 8–23)
CO2: 21 mmol/L — ABNORMAL LOW (ref 22–32)
Calcium: 7.7 mg/dL — ABNORMAL LOW (ref 8.9–10.3)
Chloride: 112 mmol/L — ABNORMAL HIGH (ref 98–111)
Creatinine, Ser: 0.89 mg/dL (ref 0.44–1.00)
GFR, Estimated: >60 mL/min (ref >60)
Glucose, Bld: 91 mg/dL (ref 70–99)
Potassium: 4.1 mmol/L (ref 3.5–5.1)
Sodium: 140 mmol/L (ref 135–145)

## 2023-06-22 LAB — CULTURE, BLOOD (ROUTINE X 2): Special Requests: ADEQUATE

## 2023-06-22 MED ORDER — AZITHROMYCIN 500 MG PO TABS
500.0000 mg | ORAL_TABLET | Freq: Every day | ORAL | Status: DC
  Administered 2023-06-22: 500 mg via ORAL
  Filled 2023-06-22: qty 1

## 2023-06-22 NOTE — Progress Notes (Signed)
Occupational Therapy Treatment Patient Details Name: Kimberly Beck MRN: [id number] DOB: 1945/12/15 Today's Date: 06/22/2023   History of present illness Pt is 77 yo female who presents 06/19/23 after  finished her C. difficile treatment she started having abdominal pain and persistent nausea and vomiting; CT scan of the abdomen pelvis showed mild colitis and trace right-sided pleural effusion; probable pna; AKI;  PMH: lung cancer, DVT, L1 compression fracture, back pain, hypertension, hyperlipidemia, recent fall with right proximal humerus fracture 05/14/23   OT comments  Pt declined bathing and dressing, reports fatigue. Educated pt to continue coming out of sling x 3/day for AROM of R elbow to hand. Pt demonstrated ability to transfer to and from [facility] modified independently and perform pericare, RN notified. Reinforced compensatory strategies for ADLs. Continue to recommend resumption of home OT.      If plan is discharge home, recommend the following:  A lot of help with bathing/dressing/bathroom;Assistance with cooking/housework;Assist for transportation;Help with stairs or ramp for entrance   Equipment Recommendations  None recommended by OT    Recommendations for Other Services      Precautions / Restrictions Precautions Precautions: Fall Precaution Comments: pt recently fell and fractured R humerus Required Braces or Orthoses: Sling Restrictions Weight Bearing Restrictions Per Provider Order: Yes RUE Weight Bearing Per Provider Order: Non weight bearing       Mobility Bed Mobility Overal bed mobility: Modified Independent             General bed mobility comments: HOB up    Transfers Overall transfer level: Modified independent Equipment used: None Transfers: Bed to chair/wheelchair/BSC   Stand pivot transfers: Modified independent (Device/Increase time)         General transfer comment: demonstrated ability to transfer to and from [facility] safely, reports  she sometimes needs to use the bathroom quickly and cannot wait for staff to walk her to the bathroom     Balance Overall balance assessment: Needs assistance   Sitting balance-Leahy Scale: Good       Standing balance-Leahy Scale: Fair                             ADL either performed or assessed with clinical judgement   ADL                           Toilet Transfer: Modified Independent;Stand-pivot;BSC/3in1           Functional mobility during ADLs: Modified independent      Extremity/Trunk Assessment Upper Extremity Assessment RUE Deficits / Details: completed AROM elbow to hand            Vision       Perception     Praxis      Cognition Arousal: Alert Behavior During Therapy: WFL for tasks assessed/performed Overall Cognitive Status: Within Functional Limits for tasks assessed                                          Exercises      Shoulder Instructions       General Comments      Pertinent Vitals/ Pain       Pain Assessment Pain Assessment: No/denies pain  Home Living  Prior Functioning/Environment              Frequency  Min 1X/week        Progress Toward Goals  OT Goals(current goals can now be found in the care plan section)  Progress towards OT goals: Progressing toward goals  Acute Rehab OT Goals OT Goal Formulation: With patient Time For Goal Achievement: 07/04/23 Potential to Achieve Goals: Good  Plan      Co-evaluation                 AM-PAC OT "6 Clicks" Daily Activity     Outcome Measure   Help from another person eating meals?: A Little Help from another person taking care of personal grooming?: A Little Help from another person toileting, which includes using toliet, bedpan, or urinal?: A Little Help from another person bathing (including washing, rinsing, drying)?: A Lot Help from another person to  put on and taking off regular upper body clothing?: A Lot Help from another person to put on and taking off regular lower body clothing?: A Lot 6 Click Score: 15    End of Session Equipment Utilized During Treatment: Other (comment) (sling)  OT Visit Diagnosis: Muscle weakness (generalized) (M62.81)   Activity Tolerance Patient tolerated treatment well   Patient Left in bed;with call bell/phone within reach;with nursing/sitter in room   Nurse Communication Mobility status        Time: 8657-8469 OT Time Calculation (min): 16 min  Charges: OT General Charges $OT Visit: 1 Visit OT Treatments $Self Care/Home Management : 8-22 mins  Berna Spare, OTR/L Acute Rehabilitation Services Office: [phone]   Evern Bio 06/22/2023, 11:13 AM

## 2023-06-22 NOTE — Progress Notes (Signed)
[facility] PROGRESS NOTE    Progress Note  Kimberly Beck  ZOX:[id number] DOB: 1945-09-25 DOA: 06/19/2023 PCP: Jarrett Soho, PA-C     Brief Narrative:   Kimberly Beck is an 77 y.o. female past medical history of recently treated C. difficile colitis with a last dose on 06/16/2023 history of lung cancer essential hypertension comes in for 4 pain, right sided chest pain, nausea and vomiting with cough and shortness of breath.   Assessment/Plan:   Intractable nausea vomiting and abdominal pain probable hospital-acquired pneumonia: She was started empirically on Rocephin and azithromycin. Blood cultures ID reflex panel was positive for staph species, afebrile leukocytosis improving. She is tolerating her diet. Continue famotidine for twice a day continue Zofran. PT evaluated the patient recommended home health PT.  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 KVO IV fluids now she is not having nausea and vomiting will allow a diet.  Essential HTN (hypertension) Diuretic and ACE inhibitor were held in the setting of acute kidney injury. Blood pressure has remained relatively stable.  Sinus tachycardia Likely due to hypovolemia resolved with IV fluids.  History of C. difficile colitis: Treating her with oral vancomycin for prophylaxis as she is currently on antibiotics for pneumonia. Will continue vancomycin for up to 2 days post pneumonia treatment   DVT prophylaxis: lovenox Family Communication:non Status is: Inpatient Remains inpatient appropriate because: Pneumonia    Code Status:     Code Status Orders  (From admission, onward)           Start     Ordered   06/19/23 1741  Full code  Continuous       Question:  By:  Answer:  Consent: discussion documented in EHR   06/19/23 1742           Code Status History     Date Active Date  Inactive Code Status Order ID Comments User Context   06/03/2023 0329 06/05/2023 2008 Full Code [id number]  Gery Pray, MD ED   05/17/2023 0452 05/22/2023 2342 Full Code [id number]  Gery Pray, MD ED   07/29/2020 1535 07/30/2020 1909 Full Code [id number]  Ardelle Balls, PA-C Inpatient   04/29/2020 1650 05/01/2020 1647 Full Code [id number]  Leary Roca, PA-C Inpatient         IV Access:   Peripheral IV   Procedures and diagnostic studies:   No results found.    Medical Consultants:   None.   Subjective:    Kimberly Beck bowel movement slow down.  Objective:    Vitals:   06/21/23 0744 06/21/23 1618 06/22/23 0546 06/22/23 0747  BP: (!) 157/71 (!) 162/96 131/71 126/67  Pulse: 84 91 86 84  Resp: 16 17 18 17   Temp: 98.5 F (36.9 C) 97.7 F (36.5 C) 98.1 F (36.7 C) 97.8 F (36.6 C)  TempSrc: Oral Oral Oral Oral  SpO2: 97% 98% 94% 93%  Weight:      Height:       SpO2: 93 %   Intake/Output Summary (Last 24 hours) at 06/22/2023 0957 Last data filed at 06/22/2023 0630 Gross per 24 hour  Intake 683.04 ml  Output --  Net 683.04 ml    Filed Weights   06/19/23 2220  Weight: 66.4 kg    Exam: General exam: In no acute distress. Respiratory system: Good air movement and clear to auscultation. Cardiovascular system: S1 & S2 heard,  RRR. No JVD. Gastrointestinal system: Abdomen is nondistended, soft and nontender.  Extremities: No pedal edema. Skin: No rashes, lesions or ulcers Psychiatry: Judgement and insight appear normal. Mood & affect appropriate. Data Reviewed:    Labs: Basic Metabolic Panel: Recent Labs  Lab 06/19/23 0933 06/19/23 1844 06/20/23 0631 06/22/23 0444  NA 138  --  141 140  K 2.9*  --  3.2* 4.1  CL 98  --  110 112*  CO2 22  --  21* 21*  GLUCOSE 152*  --  102* 91  BUN 15  --  12 <5*  CREATININE 1.05* 0.94 0.75 0.89  CALCIUM 8.9  --  7.2* 7.7*   GFR Estimated Creatinine Clearance: 50.3 mL/min (by C-G  formula based on SCr of 0.89 mg/dL). Liver Function Tests: Recent Labs  Lab 06/19/23 0933  AST 16  ALT 9  ALKPHOS 106  BILITOT 1.6*  PROT 6.7  ALBUMIN 3.0*   Recent Labs  Lab 06/19/23 0933  LIPASE 25   No results for input(s): "AMMONIA" in the last 168 hours. Coagulation profile No results for input(s): "INR", "PROTIME" in the last 168 hours. COVID-19 Labs  No results for input(s): "DDIMER", "FERRITIN", "LDH", "CRP" in the last 72 hours.  Lab Results  Component Value Date   SARSCOV2NAA NEGATIVE 06/19/2023   SARSCOV2NAA NEGATIVE 07/27/2020   SARSCOV2NAA NEGATIVE 04/27/2020    CBC: Recent Labs  Lab 06/19/23 0933 06/19/23 1844 06/20/23 0631  WBC 19.1* 15.8* 12.6*  HGB 13.1 11.3* 10.2*  HCT 40.8 35.7* 32.4*  MCV 90.1 91.5 91.8  PLT 254 204 217   Cardiac Enzymes: No results for input(s): "CKTOTAL", "CKMB", "CKMBINDEX", "TROPONINI" in the last 168 hours. BNP (last 3 results) No results for input(s): "PROBNP" in the last 8760 hours. CBG: No results for input(s): "GLUCAP" in the last 168 hours. D-Dimer: No results for input(s): "DDIMER" in the last 72 hours. Hgb A1c: No results for input(s): "HGBA1C" in the last 72 hours. Lipid Profile: No results for input(s): "CHOL", "HDL", "LDLCALC", "TRIG", "CHOLHDL", "LDLDIRECT" in the last 72 hours. Thyroid function studies: No results for input(s): "TSH", "T4TOTAL", "T3FREE", "THYROIDAB" in the last 72 hours.  Invalid input(s): "FREET3" Anemia work up: No results for input(s): "VITAMINB12", "FOLATE", "FERRITIN", "TIBC", "IRON", "RETICCTPCT" in the last 72 hours. Sepsis Labs: Recent Labs  Lab 06/19/23 0933 06/19/23 1844 06/20/23 0631  WBC 19.1* 15.8* 12.6*   Microbiology Recent Results (from the past 240 hours)  Blood culture (routine x 2)     Status: None (Preliminary result)   Collection Time: 06/19/23  6:38 PM   Specimen: BLOOD LEFT HAND  Result Value Ref Range Status   Specimen Description BLOOD LEFT HAND   Final   Special Requests   Final    BOTTLES DRAWN AEROBIC ONLY Blood Culture results may not be optimal due to an inadequate volume of blood received in culture bottles   Culture   Final    NO GROWTH 3 DAYS Performed at [facility], [address], [place], [place] [address]    Report Status PENDING  Incomplete  Blood culture (routine x 2)     Status: None (Preliminary result)   Collection Time: 06/19/23  6:44 PM   Specimen: BLOOD RIGHT HAND  Result Value Ref Range Status   Specimen Description BLOOD RIGHT HAND  Final   Special Requests   Final    BOTTLES DRAWN AEROBIC AND ANAEROBIC Blood Culture adequate volume   Culture  Setup Time  Final    GRAM POSITIVE COCCI AEROBIC BOTTLE ONLY CRITICAL RESULT CALLED TO, READ BACK BY AND VERIFIED WITH: PHARMD EMMA PAYTES ON 06/20/23 @ 2115 BY DRT    Culture   Final    GRAM POSITIVE COCCI IDENTIFICATION TO FOLLOW Performed at [facility], [address], [place], [place] [address]    Report Status PENDING  Incomplete  Blood Culture ID Panel (Reflexed)     Status: Abnormal   Collection Time: 06/19/23  6:44 PM  Result Value Ref Range Status   Enterococcus faecalis NOT DETECTED NOT DETECTED Final   Enterococcus Faecium NOT DETECTED NOT DETECTED Final   Listeria monocytogenes NOT DETECTED NOT DETECTED Final   Staphylococcus species DETECTED (A) NOT DETECTED Final    Comment: CRITICAL RESULT CALLED TO, READ BACK BY AND VERIFIED WITH: PHARMD EMMA PAYTES ON 06/20/23 @ 2115 BY DRT    Staphylococcus aureus (BCID) NOT DETECTED NOT DETECTED Final   Staphylococcus epidermidis NOT DETECTED NOT DETECTED Final   Staphylococcus lugdunensis NOT DETECTED NOT DETECTED Final   Streptococcus species NOT DETECTED NOT DETECTED Final   Streptococcus agalactiae NOT DETECTED NOT DETECTED Final   Streptococcus pneumoniae NOT DETECTED NOT DETECTED Final   Streptococcus pyogenes NOT DETECTED NOT DETECTED Final   A.calcoaceticus-baumannii NOT DETECTED  NOT DETECTED Final   Bacteroides fragilis NOT DETECTED NOT DETECTED Final   Enterobacterales NOT DETECTED NOT DETECTED Final   Enterobacter cloacae complex NOT DETECTED NOT DETECTED Final   Escherichia coli NOT DETECTED NOT DETECTED Final   Klebsiella aerogenes NOT DETECTED NOT DETECTED Final   Klebsiella oxytoca NOT DETECTED NOT DETECTED Final   Klebsiella pneumoniae NOT DETECTED NOT DETECTED Final   Proteus species NOT DETECTED NOT DETECTED Final   Salmonella species NOT DETECTED NOT DETECTED Final   Serratia marcescens NOT DETECTED NOT DETECTED Final   Haemophilus influenzae NOT DETECTED NOT DETECTED Final   Neisseria meningitidis NOT DETECTED NOT DETECTED Final   Pseudomonas aeruginosa NOT DETECTED NOT DETECTED Final   Stenotrophomonas maltophilia NOT DETECTED NOT DETECTED Final   Candida albicans NOT DETECTED NOT DETECTED Final   Candida auris NOT DETECTED NOT DETECTED Final   Candida glabrata NOT DETECTED NOT DETECTED Final   Candida krusei NOT DETECTED NOT DETECTED Final   Candida parapsilosis NOT DETECTED NOT DETECTED Final   Candida tropicalis NOT DETECTED NOT DETECTED Final   Cryptococcus neoformans/gattii NOT DETECTED NOT DETECTED Final    Comment: Performed at [facility], [address], [place], [place] [address]  Resp panel by RT-PCR (RSV, Flu A&B, Covid) Anterior Nasal Swab     Status: None   Collection Time: 06/19/23  6:48 PM   Specimen: Anterior Nasal Swab  Result Value Ref Range Status   SARS Coronavirus 2 by RT PCR NEGATIVE NEGATIVE Final   Influenza A by PCR NEGATIVE NEGATIVE Final   Influenza B by PCR NEGATIVE NEGATIVE Final    Comment: (NOTE) The Xpert Xpress SARS-CoV-2/FLU/RSV plus assay is intended as an aid in the diagnosis of influenza from Nasopharyngeal swab specimens and should not be used as a sole basis for treatment. Nasal washings and aspirates are unacceptable for Xpert Xpress SARS-CoV-2/FLU/RSV testing.  Fact Sheet for  Patients: BloggerCourse.com  Fact Sheet for Healthcare Providers: SeriousBroker.it  This test is not yet approved or cleared by the [place] FDA and has been authorized for detection and/or diagnosis of SARS-CoV-2 by FDA under an Emergency Use Authorization (EUA). This EUA will remain in effect (meaning this test  can be used) for the duration of the COVID-19 declaration under Section 564(b)(1) of the Act, 21 U.S.C. section 360bbb-3(b)(1), unless the authorization is terminated or revoked.     Resp Syncytial Virus by PCR NEGATIVE NEGATIVE Final    Comment: (NOTE) Fact Sheet for Patients: BloggerCourse.com  Fact Sheet for Healthcare Providers: SeriousBroker.it  This test is not yet approved or cleared by the [place] FDA and has been authorized for detection and/or diagnosis of SARS-CoV-2 by FDA under an Emergency Use Authorization (EUA). This EUA will remain in effect (meaning this test can be used) for the duration of the COVID-19 declaration under Section 564(b)(1) of the Act, 21 U.S.C. section 360bbb-3(b)(1), unless the authorization is terminated or revoked.  Performed at [facility], [address], [place], [place] [address]   MRSA Next Gen by PCR, Nasal     Status: None   Collection Time: 06/20/23  8:37 AM   Specimen: Nasal Mucosa; Nasal Swab  Result Value Ref Range Status   MRSA by PCR Next Gen NOT DETECTED NOT DETECTED Final    Comment: (NOTE) The GeneXpert MRSA Assay (FDA approved for NASAL specimens only), is one component of a comprehensive MRSA colonization surveillance program. It is not intended to diagnose MRSA infection nor to guide or monitor treatment for MRSA infections. Test performance is not FDA approved in patients less than 55 years old. Performed at [facility], [address] [place], [place] [address]      Medications:     aspirin EC  81 mg Oral Daily   enoxaparin (LOVENOX) injection  40 mg Subcutaneous Q24H   escitalopram  10 mg Oral Daily   famotidine  40 mg Oral BID   metoprolol succinate  25 mg Oral Daily   pantoprazole  40 mg Oral BID   polyethylene glycol  17 g Oral BID   rosuvastatin  5 mg Oral Daily   vancomycin  125 mg Oral BID   Continuous Infusions:  azithromycin 500 mg (06/21/23 1958)   cefTRIAXone (ROCEPHIN)  IV 2 g (06/21/23 2114)      LOS: 3 days   Marinda Elk  [facility]  06/22/2023, 9:57 AM

## 2023-06-22 NOTE — Progress Notes (Signed)
Physical Therapy Treatment Patient Details Name: Kimberly Beck MRN: [id number] DOB: 01-Sep-1945 Today's Date: 06/22/2023   History of Present Illness Pt is 77 yo female who presents 06/19/23 after  finished her C. difficile treatment she started having abdominal pain and persistent nausea and vomiting; CT scan of the abdomen pelvis showed mild colitis and trace right-sided pleural effusion; probable pna; AKI;  PMH: lung cancer, DVT, L1 compression fracture, back pain, hypertension, hyperlipidemia, recent fall with right proximal humerus fracture 05/14/23    PT Comments  Pt received in supine and agreeable to session. Pt able complete all mobility tasks with CGA for safety and increased time due to weakness and precautions. Pt able to tolerate increased gait distance this session without AD, demonstrating some instability, but no LOB. Pt reports that balance is not back to baseline due to BLE weakness. Education provided on progressing exercise at home for increased strength and endurance. Pt able to perform x5 STS from EOB, however demonstrates increased DOE and fatigue. Pt continues to benefit from PT services to progress toward functional mobility goals.     If plan is discharge home, recommend the following: A little help with bathing/dressing/bathroom;Assist for transportation;Assistance with cooking/housework;Help with stairs or ramp for entrance   Can travel by private vehicle     Yes  Equipment Recommendations  None recommended by PT    Recommendations for Other Services       Precautions / Restrictions Precautions Precautions: Fall Precaution Comments: pt recently fell and fractured R humerus Required Braces or Orthoses: Sling Restrictions Weight Bearing Restrictions Per Provider Order: Yes RUE Weight Bearing Per Provider Order: Non weight bearing     Mobility  Bed Mobility Overal bed mobility: Modified Independent Bed Mobility: Supine to Sit, Sit to Supine            General bed mobility comments: increased time    Transfers Overall transfer level: Needs assistance Equipment used: None Transfers: Sit to/from Stand Sit to Stand: Supervision           General transfer comment: requires LUE support for power up and increased time from lower surface    Ambulation/Gait Ambulation/Gait assistance: Contact guard assist Gait Distance (Feet): 100 Feet Assistive device: None Gait Pattern/deviations: Step-through pattern, Trunk flexed, Decreased stride length Gait velocity: decreased     General Gait Details: some instability without UE support, but no LOB. CGA for safety      Balance Overall balance assessment: Needs assistance Sitting-balance support: No upper extremity supported, Feet supported Sitting balance-Leahy Scale: Good Sitting balance - Comments: sitting EOB   Standing balance support: No upper extremity supported, During functional activity Standing balance-Leahy Scale: Fair Standing balance comment: without AD                            Cognition Arousal: Alert Behavior During Therapy: WFL for tasks assessed/performed Overall Cognitive Status: Within Functional Limits for tasks assessed                                          Exercises Other Exercises Other Exercises: serial STS from EOB x5    General Comments        Pertinent Vitals/Pain Pain Assessment Pain Assessment: No/denies pain     PT Goals (current goals can now be found in the care plan section) Acute Rehab PT Goals  Patient Stated Goal: to be able to stand up from toilet/char without use of her arms PT Goal Formulation: With patient Time For Goal Achievement: 07/04/23 Progress towards PT goals: Progressing toward goals    Frequency    Min 1X/week       AM-PAC PT "6 Clicks" Mobility   Outcome Measure  Help needed turning from your back to your side while in a flat bed without using bedrails?: None Help  needed moving from lying on your back to sitting on the side of a flat bed without using bedrails?: None Help needed moving to and from a bed to a chair (including a wheelchair)?: A Little Help needed standing up from a chair using your arms (e.g., wheelchair or bedside chair)?: A Little Help needed to walk in hospital room?: A Little Help needed climbing 3-5 steps with a railing? : A Little 6 Click Score: 20    End of Session   Activity Tolerance: Patient limited by fatigue Patient left: in bed;with call bell/phone within reach;with bed alarm set Nurse Communication: Mobility status PT Visit Diagnosis: Unsteadiness on feet (R26.81);History of falling (Z91.81);Muscle weakness (generalized) (M62.81);Pain;Difficulty in walking, not elsewhere classified (R26.2)     Time: 4098-1191 PT Time Calculation (min) (ACUTE ONLY): 20 min  Charges:    $Gait Training: 8-22 mins PT General Charges $$ ACUTE PT VISIT: 1 Visit                     Johny Shock, PTA Acute Rehabilitation Services Secure Chat Preferred  [phone]    Johny Shock 06/22/2023, 9:33 AM

## 2023-06-22 NOTE — Care Management Important Message (Signed)
Important Message  Patient Details  Name: Kimberly Beck MRN: [id number] Date of Birth: 1946/05/05   Important Message Given:  Yes - Medicare IM     Dorena Bodo 06/22/2023, 2:51 PM

## 2023-06-22 NOTE — Plan of Care (Signed)

## 2023-06-23 DIAGNOSIS — J189 Pneumonia, unspecified organism: Secondary | ICD-10-CM | POA: Diagnosis not present

## 2023-06-23 DIAGNOSIS — R112 Nausea with vomiting, unspecified: Secondary | ICD-10-CM | POA: Diagnosis not present

## 2023-06-23 DIAGNOSIS — N179 Acute kidney failure, unspecified: Secondary | ICD-10-CM | POA: Diagnosis not present

## 2023-06-23 DIAGNOSIS — R109 Unspecified abdominal pain: Secondary | ICD-10-CM | POA: Diagnosis not present

## 2023-06-23 MED ORDER — LISINOPRIL 20 MG PO TABS
20.0000 mg | ORAL_TABLET | Freq: Every day | ORAL | Status: DC
  Administered 2023-06-23: 20 mg via ORAL
  Filled 2023-06-23: qty 1

## 2023-06-23 MED ORDER — DOXYCYCLINE HYCLATE 100 MG PO TABS
100.0000 mg | ORAL_TABLET | Freq: Two times a day (BID) | ORAL | Status: DC
  Administered 2023-06-23: 100 mg via ORAL
  Filled 2023-06-23: qty 1

## 2023-06-23 MED ORDER — LISINOPRIL-HYDROCHLOROTHIAZIDE 20-25 MG PO TABS: 1.0000 | ORAL_TABLET | Freq: Every day | ORAL | Status: DC

## 2023-06-23 MED ORDER — DOXYCYCLINE HYCLATE 100 MG PO TABS: 100.0000 mg | ORAL_TABLET | Freq: Two times a day (BID) | ORAL | Status: DC

## 2023-06-23 MED ORDER — AZITHROMYCIN 500 MG PO TABS: 500.0000 mg | ORAL_TABLET | Freq: Every day | ORAL | Status: DC

## 2023-06-23 MED ORDER — CEFDINIR 300 MG PO CAPS: 300.0000 mg | ORAL_CAPSULE | Freq: Two times a day (BID) | ORAL | Status: DC

## 2023-06-23 MED ORDER — CEFDINIR 300 MG PO CAPS
300.0000 mg | ORAL_CAPSULE | Freq: Two times a day (BID) | ORAL | Status: DC
  Administered 2023-06-23: 300 mg via ORAL
  Filled 2023-06-23: qty 1

## 2023-06-23 NOTE — Plan of Care (Signed)
°  Problem: Education: Goal: Knowledge of General Education information will improve Description: Including pain rating scale, medication(s)/side effects and non-pharmacologic comfort measures Outcome: Progressing   Problem: Clinical Measurements: Goal: Ability to maintain clinical measurements within normal limits will improve Outcome: Progressing Goal: Will remain free from infection Outcome: Progressing Goal: Diagnostic test results will improve Outcome: Progressing   Problem: Nutrition: Goal: Adequate nutrition will be maintained Outcome: Progressing

## 2023-06-23 NOTE — Plan of Care (Signed)
  Problem: Activity: Goal: Risk for activity intolerance will decrease Outcome: Progressing   Problem: Nutrition: Goal: Adequate nutrition will be maintained Outcome: Progressing   Problem: Coping: Goal: Level of anxiety will decrease Outcome: Progressing   Problem: Pain Management: Goal: General experience of comfort will improve Outcome: Progressing

## 2023-06-23 NOTE — Progress Notes (Signed)
[facility] PROGRESS NOTE    Progress Note  Kimberly Beck  ZOX:[id number] DOB: [birth year] DOA: 06/19/2023 PCP: Jarrett Soho, PA-C     Brief Narrative:   Kimberly Beck is an 77 y.o. female past medical history of recently treated C. difficile colitis with a last dose on 06/16/2023 history of lung cancer essential hypertension comes in for 4 pain, right sided chest pain, nausea and vomiting with cough and shortness of breath.   Assessment/Plan:   Intractable nausea vomiting and abdominal pain probable hospital-acquired pneumonia: She was started empirically on Rocephin and azithromycin. 1 to blood cultures grew staph epi . She is tolerating her diet. Continue famotidine for twice a day continue Zofran. PT evaluated the patient recommended home health PT. 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 KVO IV fluids allow diet.  Essential HTN (hypertension) Diuretic and ACE inhibitor were held in the setting of acute kidney injury. Blood pressure is trending up we will ahead and restart ACE inhibitor. Continue to hold diuretic therapy for now.  Sinus tachycardia Likely due to hypovolemia resolved with IV fluids.  History of C. difficile colitis: Treating her with oral vancomycin for prophylaxis as she is currently on antibiotics for pneumonia. Will continue vancomycin for up to 2 days post pneumonia treatment   DVT prophylaxis: lovenox Family Communication:non Status is: Inpatient Remains inpatient appropriate because: Pneumonia    Code Status:     Code Status Orders  (From admission, onward)           Start     Ordered   06/19/23 1741  Full code  Continuous       Question:  By:  Answer:  Consent: discussion documented in EHR   06/19/23 1742           Code Status History     Date Active Date Inactive  Code Status Order ID Comments User Context   06/03/2023 0329 06/05/2023 2008 Full Code [id number]  Gery Pray, MD ED   05/17/2023 0452 05/22/2023 2342 Full Code [id number]  Gery Pray, MD ED   07/29/2020 1535 07/30/2020 1909 Full Code [id number]  Ardelle Balls, PA-C Inpatient   04/29/2020 1650 05/01/2020 1647 Full Code [id number]  Leary Roca, PA-C Inpatient         IV Access:   Peripheral IV   Procedures and diagnostic studies:   No results found.    Medical Consultants:   None.   Subjective:    Kimberly Beck no new complaints.  Objective:    Vitals:   06/22/23 1642 06/22/23 2035 06/23/23 0419 06/23/23 0827  BP: (!) 143/78 136/69 (!) 149/73 (!) 152/78  Pulse: 77 88 75 81  Resp: 17 18 17 18   Temp: 98.1 F (36.7 C) 98.3 F (36.8 C) 98.1 F (36.7 C) 98.3 F (36.8 C)  TempSrc: Oral Oral Oral Oral  SpO2: 97% 95% 96% 96%  Weight:      Height:       SpO2: 96 %   Intake/Output Summary (Last 24 hours) at 06/23/2023 1037 Last data filed at 06/23/2023 0102 Gross per 24 hour  Intake 580 ml  Output --  Net 580 ml    Filed Weights   06/19/23 2220  Weight: 66.4 kg    Exam: General exam: In no acute distress. Respiratory system: Good air movement and clear to auscultation. Cardiovascular system: S1 &  S2 heard, RRR. No JVD. Gastrointestinal system: Abdomen is nondistended, soft and nontender.  Extremities: No pedal edema. Skin: No rashes, lesions or ulcers Psychiatry: Judgement and insight appear normal. Mood & affect appropriate. Data Reviewed:    Labs: Basic Metabolic Panel: Recent Labs  Lab 06/19/23 0933 06/19/23 1844 06/20/23 0631 06/22/23 0444  NA 138  --  141 140  K 2.9*  --  3.2* 4.1  CL 98  --  110 112*  CO2 22  --  21* 21*  GLUCOSE 152*  --  102* 91  BUN 15  --  12 <5*  CREATININE 1.05* 0.94 0.75 0.89  CALCIUM 8.9  --  7.2* 7.7*   GFR Estimated Creatinine Clearance: 50.3 mL/min (by C-G formula based on SCr  of 0.89 mg/dL). Liver Function Tests: Recent Labs  Lab 06/19/23 0933  AST 16  ALT 9  ALKPHOS 106  BILITOT 1.6*  PROT 6.7  ALBUMIN 3.0*   Recent Labs  Lab 06/19/23 0933  LIPASE 25   No results for input(s): "AMMONIA" in the last 168 hours. Coagulation profile No results for input(s): "INR", "PROTIME" in the last 168 hours. COVID-19 Labs  No results for input(s): "DDIMER", "FERRITIN", "LDH", "CRP" in the last 72 hours.  Lab Results  Component Value Date   SARSCOV2NAA NEGATIVE 06/19/2023   SARSCOV2NAA NEGATIVE 07/27/2020   SARSCOV2NAA NEGATIVE 04/27/2020    CBC: Recent Labs  Lab 06/19/23 0933 06/19/23 1844 06/20/23 0631  WBC 19.1* 15.8* 12.6*  HGB 13.1 11.3* 10.2*  HCT 40.8 35.7* 32.4*  MCV 90.1 91.5 91.8  PLT 254 204 217   Cardiac Enzymes: No results for input(s): "CKTOTAL", "CKMB", "CKMBINDEX", "TROPONINI" in the last 168 hours. BNP (last 3 results) No results for input(s): "PROBNP" in the last 8760 hours. CBG: No results for input(s): "GLUCAP" in the last 168 hours. D-Dimer: No results for input(s): "DDIMER" in the last 72 hours. Hgb A1c: No results for input(s): "HGBA1C" in the last 72 hours. Lipid Profile: No results for input(s): "CHOL", "HDL", "LDLCALC", "TRIG", "CHOLHDL", "LDLDIRECT" in the last 72 hours. Thyroid function studies: No results for input(s): "TSH", "T4TOTAL", "T3FREE", "THYROIDAB" in the last 72 hours.  Invalid input(s): "FREET3" Anemia work up: No results for input(s): "VITAMINB12", "FOLATE", "FERRITIN", "TIBC", "IRON", "RETICCTPCT" in the last 72 hours. Sepsis Labs: Recent Labs  Lab 06/19/23 0933 06/19/23 1844 06/20/23 0631  WBC 19.1* 15.8* 12.6*   Microbiology Recent Results (from the past 240 hours)  Blood culture (routine x 2)     Status: None (Preliminary result)   Collection Time: 06/19/23  6:38 PM   Specimen: BLOOD LEFT HAND  Result Value Ref Range Status   Specimen Description BLOOD LEFT HAND  Final   Special  Requests   Final    BOTTLES DRAWN AEROBIC ONLY Blood Culture results may not be optimal due to an inadequate volume of blood received in culture bottles   Culture   Final    NO GROWTH 4 DAYS Performed at [facility], [address] [place], [place] [address]    Report Status PENDING  Incomplete  Blood culture (routine x 2)     Status: Abnormal   Collection Time: 06/19/23  6:44 PM   Specimen: BLOOD RIGHT HAND  Result Value Ref Range Status   Specimen Description BLOOD RIGHT HAND  Final   Special Requests   Final    BOTTLES DRAWN AEROBIC AND ANAEROBIC Blood Culture adequate volume   Culture  Setup Time  Final    GRAM POSITIVE COCCI AEROBIC BOTTLE ONLY CRITICAL RESULT CALLED TO, READ BACK BY AND VERIFIED WITH: PHARMD EMMA PAYTES ON 06/20/23 @ 2115 BY DRT    Culture (A)  Final    COAGULASE NEGATIVE STAPHYLOCOCCUS THE SIGNIFICANCE OF ISOLATING THIS ORGANISM FROM A SINGLE SET OF BLOOD CULTURES WHEN MULTIPLE SETS ARE DRAWN IS UNCERTAIN. PLEASE NOTIFY THE MICROBIOLOGY DEPARTMENT WITHIN ONE WEEK IF SPECIATION AND SENSITIVITIES ARE REQUIRED. Performed at [facility], [address], [place], [place] [address]    Report Status 06/22/2023 FINAL  Final  Blood Culture ID Panel (Reflexed)     Status: Abnormal   Collection Time: 06/19/23  6:44 PM  Result Value Ref Range Status   Enterococcus faecalis NOT DETECTED NOT DETECTED Final   Enterococcus Faecium NOT DETECTED NOT DETECTED Final   Listeria monocytogenes NOT DETECTED NOT DETECTED Final   Staphylococcus species DETECTED (A) NOT DETECTED Final    Comment: CRITICAL RESULT CALLED TO, READ BACK BY AND VERIFIED WITH: PHARMD EMMA PAYTES ON 06/20/23 @ 2115 BY DRT    Staphylococcus aureus (BCID) NOT DETECTED NOT DETECTED Final   Staphylococcus epidermidis NOT DETECTED NOT DETECTED Final   Staphylococcus lugdunensis NOT DETECTED NOT DETECTED Final   Streptococcus species NOT DETECTED NOT DETECTED Final   Streptococcus agalactiae  NOT DETECTED NOT DETECTED Final   Streptococcus pneumoniae NOT DETECTED NOT DETECTED Final   Streptococcus pyogenes NOT DETECTED NOT DETECTED Final   A.calcoaceticus-baumannii NOT DETECTED NOT DETECTED Final   Bacteroides fragilis NOT DETECTED NOT DETECTED Final   Enterobacterales NOT DETECTED NOT DETECTED Final   Enterobacter cloacae complex NOT DETECTED NOT DETECTED Final   Escherichia coli NOT DETECTED NOT DETECTED Final   Klebsiella aerogenes NOT DETECTED NOT DETECTED Final   Klebsiella oxytoca NOT DETECTED NOT DETECTED Final   Klebsiella pneumoniae NOT DETECTED NOT DETECTED Final   Proteus species NOT DETECTED NOT DETECTED Final   Salmonella species NOT DETECTED NOT DETECTED Final   Serratia marcescens NOT DETECTED NOT DETECTED Final   Haemophilus influenzae NOT DETECTED NOT DETECTED Final   Neisseria meningitidis NOT DETECTED NOT DETECTED Final   Pseudomonas aeruginosa NOT DETECTED NOT DETECTED Final   Stenotrophomonas maltophilia NOT DETECTED NOT DETECTED Final   Candida albicans NOT DETECTED NOT DETECTED Final   Candida auris NOT DETECTED NOT DETECTED Final   Candida glabrata NOT DETECTED NOT DETECTED Final   Candida krusei NOT DETECTED NOT DETECTED Final   Candida parapsilosis NOT DETECTED NOT DETECTED Final   Candida tropicalis NOT DETECTED NOT DETECTED Final   Cryptococcus neoformans/gattii NOT DETECTED NOT DETECTED Final    Comment: Performed at [facility], [address] [place], [place] [address]  Resp panel by RT-PCR (RSV, Flu A&B, Covid) Anterior Nasal Swab     Status: None   Collection Time: 06/19/23  6:48 PM   Specimen: Anterior Nasal Swab  Result Value Ref Range Status   SARS Coronavirus 2 by RT PCR NEGATIVE NEGATIVE Final   Influenza A by PCR NEGATIVE NEGATIVE Final   Influenza B by PCR NEGATIVE NEGATIVE Final    Comment: (NOTE) The Xpert Xpress SARS-CoV-2/FLU/RSV plus assay is intended as an aid in the diagnosis of influenza from Nasopharyngeal swab  specimens and should not be used as a sole basis for treatment. Nasal washings and aspirates are unacceptable for Xpert Xpress SARS-CoV-2/FLU/RSV testing.  Fact Sheet for Patients: BloggerCourse.com  Fact Sheet for Healthcare Providers: SeriousBroker.it  This test is not yet approved or cleared  by the [place] and has been authorized for detection and/or diagnosis of SARS-CoV-2 by FDA under an Emergency Use Authorization (EUA). This EUA will remain in effect (meaning this test can be used) for the duration of the COVID-19 declaration under Section 564(b)(1) of the Act, 21 U.S.C. section 360bbb-3(b)(1), unless the authorization is terminated or revoked.     Resp Syncytial Virus by PCR NEGATIVE NEGATIVE Final    Comment: (NOTE) Fact Sheet for Patients: BloggerCourse.com  Fact Sheet for Healthcare Providers: SeriousBroker.it  This test is not yet approved or cleared by the [place] FDA and has been authorized for detection and/or diagnosis of SARS-CoV-2 by FDA under an Emergency Use Authorization (EUA). This EUA will remain in effect (meaning this test can be used) for the duration of the COVID-19 declaration under Section 564(b)(1) of the Act, 21 U.S.C. section 360bbb-3(b)(1), unless the authorization is terminated or revoked.  Performed at [facility], [address], [place], [place] [address]   MRSA Next Gen by PCR, Nasal     Status: None   Collection Time: 06/20/23  8:37 AM   Specimen: Nasal Mucosa; Nasal Swab  Result Value Ref Range Status   MRSA by PCR Next Gen NOT DETECTED NOT DETECTED Final    Comment: (NOTE) The GeneXpert MRSA Assay (FDA approved for NASAL specimens only), is one component of a comprehensive MRSA colonization surveillance program. It is not intended to diagnose MRSA infection nor to guide or monitor treatment for MRSA  infections. Test performance is not FDA approved in patients less than 30 years old. Performed at [facility], [address] [place], [place] [address]      Medications:    aspirin EC  81 mg Oral Daily   azithromycin  500 mg Oral Daily   enoxaparin (LOVENOX) injection  40 mg Subcutaneous Q24H   escitalopram  10 mg Oral Daily   famotidine  40 mg Oral BID   metoprolol succinate  25 mg Oral Daily   pantoprazole  40 mg Oral BID   rosuvastatin  5 mg Oral Daily   vancomycin  125 mg Oral BID   Continuous Infusions:  cefTRIAXone (ROCEPHIN)  IV 2 g (06/22/23 1945)      LOS: 4 days   Marinda Elk  [facility]  06/23/2023, 10:37 AM

## 2023-06-23 NOTE — Progress Notes (Signed)
Mobility Specialist Progress Note:   06/23/23 0920  Mobility  Activity Ambulated with assistance in hallway  Level of Assistance Contact guard assist, steadying assist  Assistive Device None  Distance Ambulated (ft) 150 ft  RUE Weight Bearing Per Provider Order NWB  Activity Response Tolerated well  Mobility Referral Yes  Mobility visit 1 Mobility  Mobility Specialist Start Time (ACUTE ONLY) 0920  Mobility Specialist Stop Time (ACUTE ONLY) 0931  Mobility Specialist Time Calculation (min) (ACUTE ONLY) 11 min   Pt agreeable to mobility session. Required only minG assist. Maintained RUE NWB status in sling. No c/o throughout. Pt back in bed with all needs met.   Addison Lank Mobility Specialist Please contact via SecureChat or  Rehab office at [phone]

## 2023-06-24 DIAGNOSIS — N179 Acute kidney failure, unspecified: Secondary | ICD-10-CM | POA: Diagnosis not present

## 2023-06-24 DIAGNOSIS — R112 Nausea with vomiting, unspecified: Secondary | ICD-10-CM | POA: Diagnosis not present

## 2023-06-24 DIAGNOSIS — R109 Unspecified abdominal pain: Secondary | ICD-10-CM | POA: Diagnosis not present

## 2023-06-24 DIAGNOSIS — J189 Pneumonia, unspecified organism: Secondary | ICD-10-CM | POA: Diagnosis not present

## 2023-06-24 LAB — CULTURE, BLOOD (ROUTINE X 2): Culture: NO GROWTH

## 2023-06-24 MED ORDER — LISINOPRIL-HYDROCHLOROTHIAZIDE 20-25 MG PO TABS: 1.0000 | ORAL_TABLET | Freq: Every day | ORAL | Status: DC

## 2023-06-24 MED ORDER — HYDROCHLOROTHIAZIDE 25 MG PO TABS
25.0000 mg | ORAL_TABLET | Freq: Every day | ORAL | Status: DC
  Administered 2023-06-24 – 2023-06-25 (×2): 25 mg via ORAL
  Filled 2023-06-24 (×2): qty 1

## 2023-06-24 MED ORDER — CEFDINIR 300 MG PO CAPS
300.0000 mg | ORAL_CAPSULE | Freq: Two times a day (BID) | ORAL | Status: AC
  Administered 2023-06-24 (×2): 300 mg via ORAL
  Filled 2023-06-24 (×2): qty 1

## 2023-06-24 MED ORDER — VANCOMYCIN HCL 125 MG PO CAPS
125.0000 mg | ORAL_CAPSULE | Freq: Three times a day (TID) | ORAL | Status: DC
Start: 2023-06-24 — End: 2023-06-26
  Administered 2023-06-24 – 2023-06-26 (×7): 125 mg via ORAL
  Filled 2023-06-24 (×7): qty 1

## 2023-06-24 MED ORDER — DOXYCYCLINE HYCLATE 100 MG PO TABS
100.0000 mg | ORAL_TABLET | Freq: Two times a day (BID) | ORAL | Status: AC
  Administered 2023-06-24 (×2): 100 mg via ORAL
  Filled 2023-06-24 (×2): qty 1

## 2023-06-24 MED ORDER — LISINOPRIL 20 MG PO TABS
20.0000 mg | ORAL_TABLET | Freq: Every day | ORAL | Status: DC
  Administered 2023-06-24 – 2023-06-26 (×3): 20 mg via ORAL
  Filled 2023-06-24 (×3): qty 1

## 2023-06-24 NOTE — Progress Notes (Signed)
[facility] PROGRESS NOTE    Progress Note  Kimberly Beck  OZH:[id number] DOB: 08-04-1945 DOA: 06/19/2023 PCP: Jarrett Soho, PA-C     Brief Narrative:   Kimberly Beck is an 77 y.o. female past medical history of recently treated C. difficile colitis with a last dose on 06/16/2023 history of lung cancer essential hypertension comes in for 4 pain, right sided chest pain, nausea and vomiting with cough and shortness of breath.   Assessment/Plan:   Intractable nausea vomiting and abdominal pain probable hospital-acquired pneumonia: Now on Omnicef and azithromycin. 1 to blood cultures grew staph epi, likely contaminant. She is tolerating her diet. Continue famotidine for twice a day continue Zofran. PT evaluated the patient, no home with PT. Probably home tomorrow her stools are starting to get more formed.  She will be done with her antibiotic regimen tomorrow.  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 KVO IV fluids allow diet.  Essential HTN (hypertension) Diuretic and ACE inhibitor were held on admission in the setting of acute kidney injury. Blood pressure is trending up restart ACE inhibitor and diuretics.  Sinus tachycardia Likely due to hypovolemia resolved with IV fluids.  History of C. difficile colitis: Treating her with oral vancomycin for prophylaxis as she is currently on antibiotics for pneumonia. Still having watery stools. She will complete her antibiotic treatment for pneumonia on 06/24/2023. She will continue Vanco orally for C. difficile prophylaxis 1 week post finishing her pneumonia treatment.   DVT prophylaxis: lovenox Family Communication:non Status is: Inpatient Remains inpatient appropriate because: Pneumonia    Code Status:     Code Status Orders  (From admission, onward)           Start     Ordered   06/19/23 1741  Full code  Continuous        Question:  By:  Answer:  Consent: discussion documented in EHR   06/19/23 1742           Code Status History     Date Active Date Inactive Code Status Order ID Comments User Context   06/03/2023 0329 06/05/2023 2008 Full Code [id number]  Gery Pray, MD ED   05/17/2023 0452 05/22/2023 2342 Full Code [id number]  Gery Pray, MD ED   07/29/2020 1535 07/30/2020 1909 Full Code [id number]  Ardelle Balls, PA-C Inpatient   04/29/2020 1650 05/01/2020 1647 Full Code [id number]  Leary Roca, PA-C Inpatient         IV Access:   Peripheral IV   Procedures and diagnostic studies:   No results found.    Medical Consultants:   None.   Subjective:    Kimberly Beck no complaints but she relates she has had 3 watery bowel movements this morning.  They are not formed.  Objective:    Vitals:   06/23/23 1941 06/24/23 0004 06/24/23 0432 06/24/23 0751  BP: (!) 164/67 (!) 151/80 (!) 151/72 (!) 151/83  Pulse: 76 72 75 84  Resp: 18 17 18 18   Temp: 98.5 F (36.9 C) 98 F (36.7 C) 98.1 F (36.7 C) 98 F (36.7 C)  TempSrc: Oral Oral Oral   SpO2: 96% 97% 95% 95%  Weight:      Height:       SpO2: 95 %  No intake or output data in the 24 hours ending 06/24/23 0921   Filed Weights   06/19/23 2220  Weight: 66.4 kg  Exam: General exam: In no acute distress. Respiratory system: Good air movement and clear to auscultation. Cardiovascular system: S1 & S2 heard, RRR. No JVD. Gastrointestinal system: Abdomen is nondistended, soft and nontender.  Extremities: No pedal edema. Skin: No rashes, lesions or ulcers Psychiatry: Judgement and insight appear normal. Mood & affect appropriate. Data Reviewed:    Labs: Basic Metabolic Panel: Recent Labs  Lab 06/19/23 0933 06/19/23 1844 06/20/23 0631 06/22/23 0444  NA 138  --  141 140  K 2.9*  --  3.2* 4.1  CL 98  --  110 112*  CO2 22  --  21* 21*  GLUCOSE 152*  --  102* 91  BUN 15  --  12 <5*   CREATININE 1.05* 0.94 0.75 0.89  CALCIUM 8.9  --  7.2* 7.7*   GFR Estimated Creatinine Clearance: 50.3 mL/min (by C-G formula based on SCr of 0.89 mg/dL). Liver Function Tests: Recent Labs  Lab 06/19/23 0933  AST 16  ALT 9  ALKPHOS 106  BILITOT 1.6*  PROT 6.7  ALBUMIN 3.0*   Recent Labs  Lab 06/19/23 0933  LIPASE 25   No results for input(s): "AMMONIA" in the last 168 hours. Coagulation profile No results for input(s): "INR", "PROTIME" in the last 168 hours. COVID-19 Labs  No results for input(s): "DDIMER", "FERRITIN", "LDH", "CRP" in the last 72 hours.  Lab Results  Component Value Date   SARSCOV2NAA NEGATIVE 06/19/2023   SARSCOV2NAA NEGATIVE 07/27/2020   SARSCOV2NAA NEGATIVE 04/27/2020    CBC: Recent Labs  Lab 06/19/23 0933 06/19/23 1844 06/20/23 0631  WBC 19.1* 15.8* 12.6*  HGB 13.1 11.3* 10.2*  HCT 40.8 35.7* 32.4*  MCV 90.1 91.5 91.8  PLT 254 204 217   Cardiac Enzymes: No results for input(s): "CKTOTAL", "CKMB", "CKMBINDEX", "TROPONINI" in the last 168 hours. BNP (last 3 results) No results for input(s): "PROBNP" in the last 8760 hours. CBG: No results for input(s): "GLUCAP" in the last 168 hours. D-Dimer: No results for input(s): "DDIMER" in the last 72 hours. Hgb A1c: No results for input(s): "HGBA1C" in the last 72 hours. Lipid Profile: No results for input(s): "CHOL", "HDL", "LDLCALC", "TRIG", "CHOLHDL", "LDLDIRECT" in the last 72 hours. Thyroid function studies: No results for input(s): "TSH", "T4TOTAL", "T3FREE", "THYROIDAB" in the last 72 hours.  Invalid input(s): "FREET3" Anemia work up: No results for input(s): "VITAMINB12", "FOLATE", "FERRITIN", "TIBC", "IRON", "RETICCTPCT" in the last 72 hours. Sepsis Labs: Recent Labs  Lab 06/19/23 0933 06/19/23 1844 06/20/23 0631  WBC 19.1* 15.8* 12.6*   Microbiology Recent Results (from the past 240 hours)  Blood culture (routine x 2)     Status: None   Collection Time: 06/19/23  6:38 PM    Specimen: BLOOD LEFT HAND  Result Value Ref Range Status   Specimen Description BLOOD LEFT HAND  Final   Special Requests   Final    BOTTLES DRAWN AEROBIC ONLY Blood Culture results may not be optimal due to an inadequate volume of blood received in culture bottles   Culture   Final    NO GROWTH 5 DAYS Performed at [facility], [address] [place], [place] [address]    Report Status 06/24/2023 FINAL  Final  Blood culture (routine x 2)     Status: Abnormal   Collection Time: 06/19/23  6:44 PM   Specimen: BLOOD RIGHT HAND  Result Value Ref Range Status   Specimen Description BLOOD RIGHT HAND  Final   Special Requests   Final  BOTTLES DRAWN AEROBIC AND ANAEROBIC Blood Culture adequate volume   Culture  Setup Time   Final    GRAM POSITIVE COCCI AEROBIC BOTTLE ONLY CRITICAL RESULT CALLED TO, READ BACK BY AND VERIFIED WITH: PHARMD EMMA PAYTES ON 06/20/23 @ 2115 BY DRT    Culture (A)  Final    COAGULASE NEGATIVE STAPHYLOCOCCUS THE SIGNIFICANCE OF ISOLATING THIS ORGANISM FROM A SINGLE SET OF BLOOD CULTURES WHEN MULTIPLE SETS ARE DRAWN IS UNCERTAIN. PLEASE NOTIFY THE MICROBIOLOGY DEPARTMENT WITHIN ONE WEEK IF SPECIATION AND SENSITIVITIES ARE REQUIRED. Performed at [facility], [address], [place], [place] [address]    Report Status 06/22/2023 FINAL  Final  Blood Culture ID Panel (Reflexed)     Status: Abnormal   Collection Time: 06/19/23  6:44 PM  Result Value Ref Range Status   Enterococcus faecalis NOT DETECTED NOT DETECTED Final   Enterococcus Faecium NOT DETECTED NOT DETECTED Final   Listeria monocytogenes NOT DETECTED NOT DETECTED Final   Staphylococcus species DETECTED (A) NOT DETECTED Final    Comment: CRITICAL RESULT CALLED TO, READ BACK BY AND VERIFIED WITH: PHARMD EMMA PAYTES ON 06/20/23 @ 2115 BY DRT    Staphylococcus aureus (BCID) NOT DETECTED NOT DETECTED Final   Staphylococcus epidermidis NOT DETECTED NOT DETECTED Final   Staphylococcus  lugdunensis NOT DETECTED NOT DETECTED Final   Streptococcus species NOT DETECTED NOT DETECTED Final   Streptococcus agalactiae NOT DETECTED NOT DETECTED Final   Streptococcus pneumoniae NOT DETECTED NOT DETECTED Final   Streptococcus pyogenes NOT DETECTED NOT DETECTED Final   A.calcoaceticus-baumannii NOT DETECTED NOT DETECTED Final   Bacteroides fragilis NOT DETECTED NOT DETECTED Final   Enterobacterales NOT DETECTED NOT DETECTED Final   Enterobacter cloacae complex NOT DETECTED NOT DETECTED Final   Escherichia coli NOT DETECTED NOT DETECTED Final   Klebsiella aerogenes NOT DETECTED NOT DETECTED Final   Klebsiella oxytoca NOT DETECTED NOT DETECTED Final   Klebsiella pneumoniae NOT DETECTED NOT DETECTED Final   Proteus species NOT DETECTED NOT DETECTED Final   Salmonella species NOT DETECTED NOT DETECTED Final   Serratia marcescens NOT DETECTED NOT DETECTED Final   Haemophilus influenzae NOT DETECTED NOT DETECTED Final   Neisseria meningitidis NOT DETECTED NOT DETECTED Final   Pseudomonas aeruginosa NOT DETECTED NOT DETECTED Final   Stenotrophomonas maltophilia NOT DETECTED NOT DETECTED Final   Candida albicans NOT DETECTED NOT DETECTED Final   Candida auris NOT DETECTED NOT DETECTED Final   Candida glabrata NOT DETECTED NOT DETECTED Final   Candida krusei NOT DETECTED NOT DETECTED Final   Candida parapsilosis NOT DETECTED NOT DETECTED Final   Candida tropicalis NOT DETECTED NOT DETECTED Final   Cryptococcus neoformans/gattii NOT DETECTED NOT DETECTED Final    Comment: Performed at [facility], [address] [place], [place] [address]  Resp panel by RT-PCR (RSV, Flu A&B, Covid) Anterior Nasal Swab     Status: None   Collection Time: 06/19/23  6:48 PM   Specimen: Anterior Nasal Swab  Result Value Ref Range Status   SARS Coronavirus 2 by RT PCR NEGATIVE NEGATIVE Final   Influenza A by PCR NEGATIVE NEGATIVE Final   Influenza B by PCR NEGATIVE NEGATIVE Final    Comment:  (NOTE) The Xpert Xpress SARS-CoV-2/FLU/RSV plus assay is intended as an aid in the diagnosis of influenza from Nasopharyngeal swab specimens and should not be used as a sole basis for treatment. Nasal washings and aspirates are unacceptable for Xpert Xpress SARS-CoV-2/FLU/RSV testing.  Fact Sheet for Patients:  BloggerCourse.com  Fact Sheet for Healthcare Providers: SeriousBroker.it  This test is not yet approved or cleared by the [place] FDA and has been authorized for detection and/or diagnosis of SARS-CoV-2 by FDA under an Emergency Use Authorization (EUA). This EUA will remain in effect (meaning this test can be used) for the duration of the COVID-19 declaration under Section 564(b)(1) of the Act, 21 U.S.C. section 360bbb-3(b)(1), unless the authorization is terminated or revoked.     Resp Syncytial Virus by PCR NEGATIVE NEGATIVE Final    Comment: (NOTE) Fact Sheet for Patients: BloggerCourse.com  Fact Sheet for Healthcare Providers: SeriousBroker.it  This test is not yet approved or cleared by the [place] FDA and has been authorized for detection and/or diagnosis of SARS-CoV-2 by FDA under an Emergency Use Authorization (EUA). This EUA will remain in effect (meaning this test can be used) for the duration of the COVID-19 declaration under Section 564(b)(1) of the Act, 21 U.S.C. section 360bbb-3(b)(1), unless the authorization is terminated or revoked.  Performed at [facility], [address], [place], [place] [address]   MRSA Next Gen by PCR, Nasal     Status: None   Collection Time: 06/20/23  8:37 AM   Specimen: Nasal Mucosa; Nasal Swab  Result Value Ref Range Status   MRSA by PCR Next Gen NOT DETECTED NOT DETECTED Final    Comment: (NOTE) The GeneXpert MRSA Assay (FDA approved for NASAL specimens only), is one component of a comprehensive MRSA  colonization surveillance program. It is not intended to diagnose MRSA infection nor to guide or monitor treatment for MRSA infections. Test performance is not FDA approved in patients less than 30 years old. Performed at [facility], [address], [place], [place] [address]      Medications:    aspirin EC  81 mg Oral Daily   cefdinir  300 mg Oral Q12H   doxycycline  100 mg Oral Q12H   enoxaparin (LOVENOX) injection  40 mg Subcutaneous Q24H   escitalopram  10 mg Oral Daily   famotidine  40 mg Oral BID   lisinopril  20 mg Oral Daily   metoprolol succinate  25 mg Oral Daily   pantoprazole  40 mg Oral BID   rosuvastatin  5 mg Oral Daily   vancomycin  125 mg Oral BID   Continuous Infusions:      LOS: 5 days   Marinda Elk  [facility]  06/24/2023, 9:21 AM

## 2023-06-24 NOTE — Progress Notes (Signed)
Mobility Specialist Progress Note:   06/24/23 0918  Mobility  Activity Ambulated with assistance in room  Level of Assistance Standby assist, set-up cues, supervision of patient - no hands on  Assistive Device None  Distance Ambulated (ft) 30 ft  RUE Weight Bearing Per Provider Order NWB  Activity Response Tolerated fair  Mobility Referral Yes  Mobility visit 1 Mobility  Mobility Specialist Start Time (ACUTE ONLY) [id number]  Mobility Specialist Stop Time (ACUTE ONLY) 0930  Mobility Specialist Time Calculation (min) (ACUTE ONLY) 12 min   Pt reluctantly agreeable to minimal mobility today. States she does no feel well and has had 3  liquid BM this am that have drained her energy. Ambulated to room door and back with no physical assistance, only supervision for safety. HR noted to elevate to 132 during very minimal activity. Recovered with rest. Left with all needs met.   Addison Lank Mobility Specialist Please contact via SecureChat or  Rehab office at [phone]

## 2023-06-25 DIAGNOSIS — R109 Unspecified abdominal pain: Secondary | ICD-10-CM | POA: Diagnosis not present

## 2023-06-25 DIAGNOSIS — A0472 Enterocolitis due to Clostridium difficile, not specified as recurrent: Secondary | ICD-10-CM | POA: Diagnosis not present

## 2023-06-25 DIAGNOSIS — R112 Nausea with vomiting, unspecified: Secondary | ICD-10-CM | POA: Diagnosis not present

## 2023-06-25 LAB — CBC
HCT: 34.3 % — ABNORMAL LOW (ref 36.0–46.0)
Hemoglobin: 11.1 g/dL — ABNORMAL LOW (ref 12.0–15.0)
MCH: 29.2 pg (ref 26.0–34.0)
MCHC: 32.4 g/dL (ref 30.0–36.0)
MCV: 90.3 fL (ref 80.0–100.0)
Platelets: 363 10*3/uL (ref 150–400)
RBC: 3.8 MIL/uL — ABNORMAL LOW (ref 3.87–5.11)
RDW: 14.6 % (ref 11.5–15.5)
WBC: 7.3 10*3/uL (ref 4.0–10.5)
nRBC: 0 % (ref 0.0–0.2)

## 2023-06-25 LAB — BASIC METABOLIC PANEL
Anion gap: 10 (ref 5–15)
BUN: <5 mg/dL — ABNORMAL LOW (ref 8–23)
CO2: 27 mmol/L (ref 22–32)
Calcium: 8.5 mg/dL — ABNORMAL LOW (ref 8.9–10.3)
Chloride: 104 mmol/L (ref 98–111)
Creatinine, Ser: 0.9 mg/dL (ref 0.44–1.00)
GFR, Estimated: >60 mL/min (ref >60)
Glucose, Bld: 127 mg/dL — ABNORMAL HIGH (ref 70–99)
Potassium: 3.1 mmol/L — ABNORMAL LOW (ref 3.5–5.1)
Sodium: 141 mmol/L (ref 135–145)

## 2023-06-25 MED ORDER — RISAQUAD PO CAPS
2.0000 | ORAL_CAPSULE | Freq: Every day | ORAL | Status: DC
  Administered 2023-06-25 – 2023-06-26 (×2): 2 via ORAL
  Filled 2023-06-25 (×2): qty 2

## 2023-06-25 MED ORDER — POTASSIUM CHLORIDE CRYS ER 20 MEQ PO TBCR
40.0000 meq | EXTENDED_RELEASE_TABLET | Freq: Once | ORAL | Status: AC
  Administered 2023-06-25: 40 meq via ORAL
  Filled 2023-06-25: qty 2

## 2023-06-25 NOTE — Progress Notes (Signed)
Orthopedic Tech Progress Note Patient Details:  Kimberly Beck 1946/01/13 [id number]  Ortho Devices Type of Ortho Device: Arm sling Ortho Device/Splint Location: RUE Ortho Device/Splint Interventions: Application, Ordered, Adjustment   Post Interventions Patient Tolerated: Well  Kordell Jafri A Bellah Alia 06/25/2023, 2:17 PM

## 2023-06-25 NOTE — Progress Notes (Signed)
Occupational Therapy Treatment Patient Details Name: Kimberly Beck MRN: [id number] DOB: January 05, 1946 Today's Date: 06/25/2023   History of present illness Pt is 77 yo female who presents 06/19/23 after  finished her C. difficile treatment she started having abdominal pain and persistent nausea and vomiting; CT scan of the abdomen pelvis showed mild colitis and trace right-sided pleural effusion; probable pna; AKI;  PMH: lung cancer, DVT, L1 compression fracture, back pain, hypertension, hyperlipidemia, recent fall with right proximal humerus fracture 05/14/23   OT comments  Pt. Seen for skilled OT tx session. Continued education including demo of sling don/doff with good carryover. Waist strap removed for ease with don/doff of sling and is applicable as pt. Is aware and adheres to her RUE precautions in/out of the sling.  Pt. with good technique for RUE HEP.  Also able to perform steps of LB dressing with out assistance. Continue with acute OT POC.         If plan is discharge home, recommend the following:  A lot of help with bathing/dressing/bathroom;Assistance with cooking/housework;Assist for transportation;Help with stairs or ramp for entrance   Equipment Recommendations  None recommended by OT    Recommendations for Other Services      Precautions / Restrictions Precautions Precautions: Fall Precaution Comments: pt recently fell and fractured R humerus Required Braces or Orthoses: Sling Restrictions RUE Weight Bearing Per Provider Order: Non weight bearing       Mobility Bed Mobility Overal bed mobility: Modified Independent Bed Mobility: Supine to Sit, Sit to Supine     Supine to sit: Modified independent (Device/Increase time) Sit to supine: Modified independent (Device/Increase time)   General bed mobility comments: educated on lowering hob to allow her to scoot easier into the position she wants to be in then raising hob    Transfers                          Balance                                           ADL either performed or assessed with clinical judgement   ADL Overall ADL's : Needs assistance/impaired                 Upper Body Dressing : Moderate assistance;Sitting Upper Body Dressing Details (indicate cue type and reason): cont. education for sling management, waist band portion removed as pt. c/o very uncomfortable with bed mobility.  pt. able to manage the upper strap easier once waist portion removed.  provided wash cloth for easing any neck discomfort from the strap.  rn made aware that wasit strap removed and located on bedside table if needed Lower Body Dressing: Supervision/safety;Sitting/lateral leans Lower Body Dressing Details (indicate cue type and reason): able to figure 4 BLEs for showing availability to access BLEs for LB ADLs.  reviewed keeping an eye on her socks and skin checks as there were notable indentions present on BLEs once we moved her socks.               General ADL Comments: focus on sling management, RUE ROM, LB ADLs.    Extremity/Trunk Assessment              Vision       Perception     Praxis      Cognition Arousal: Alert  Behavior During Therapy: Agitated Overall Cognitive Status: Within Functional Limits for tasks assessed                                 General Comments: frustrated regarding on going diarrhea, required redirection and positive encouragement as it remained focal point of her session        Exercises General Exercises - Upper Extremity Elbow Flexion: AROM, Right, 5 reps, Seated Elbow Extension: AROM, Right, 5 reps, Seated Wrist Flexion: AROM, Right, 5 reps, Seated Wrist Extension: AROM, Right, 5 reps, Seated Digit Composite Flexion: AROM, Right, 5 reps, Seated Composite Extension: AROM, Right, 5 reps, Seated    Shoulder Instructions       General Comments      Pertinent Vitals/ Pain       Pain  Assessment Pain Assessment: No/denies pain  Home Living                                          Prior Functioning/Environment              Frequency  Min 1X/week        Progress Toward Goals  OT Goals(current goals can now be found in the care plan section)  Progress towards OT goals: Progressing toward goals     Plan      Co-evaluation                 AM-PAC OT "6 Clicks" Daily Activity     Outcome Measure   Help from another person eating meals?: A Little Help from another person taking care of personal grooming?: A Little Help from another person toileting, which includes using toliet, bedpan, or urinal?: A Little Help from another person bathing (including washing, rinsing, drying)?: A Lot Help from another person to put on and taking off regular upper body clothing?: A Lot Help from another person to put on and taking off regular lower body clothing?: A Lot 6 Click Score: 15    End of Session Equipment Utilized During Treatment: Other (comment) (sling)  OT Visit Diagnosis: Muscle weakness (generalized) (M62.81) Pain - Right/Left: Right Pain - part of body: Shoulder   Activity Tolerance Patient tolerated treatment well   Patient Left in bed;with call bell/phone within reach   Nurse Communication Other (comment) (spoke with rn to update removal of waist strap on sling for pt. comfort and ease with don/doff sling. rn states ok. reviewed strap is located on bedside table if needed)        Time: 1610-9604 OT Time Calculation (min): 22 min  Charges: OT General Charges $OT Visit: 1 Visit OT Treatments $Self Care/Home Management : 8-22 mins  Boneta Lucks, COTA/L Acute Rehabilitation [phone]   Alessandra Bevels Lorraine-COTA/L 06/25/2023, 9:28 AM

## 2023-06-25 NOTE — Progress Notes (Signed)
Mobility Specialist Progress Note:    06/25/23 1530  Mobility  Activity Ambulated independently in hallway  Level of Assistance Standby assist, set-up cues, supervision of patient - no hands on  Assistive Device None  Distance Ambulated (ft) 150 ft  Activity Response Tolerated well  Mobility Referral Yes  Mobility visit 1 Mobility  Mobility Specialist Start Time (ACUTE ONLY) 1530  Mobility Specialist Stop Time (ACUTE ONLY) 1540  Mobility Specialist Time Calculation (min) (ACUTE ONLY) 10 min   Pt received in bed, agreeable to mobility session. Ambulated in hallway, no AD. Tolerated well, asx throughout. Returned pt to room w/o fault. Left with all needs met.    Feliciana Rossetti Mobility Specialist Please contact via Special educational needs teacher or  Rehab office at [phone]

## 2023-06-25 NOTE — Progress Notes (Signed)
[facility] PROGRESS NOTE    Progress Note  Kimberly Beck  LOV:[id number] DOB: Aug 15, 1945 DOA: 06/19/2023 PCP: Kimberly Soho, PA-C     Brief Narrative:   Kimberly Beck is an 77 y.o. female past medical history of recently treated C. difficile colitis with a last dose on 06/16/2023 history of lung cancer essential hypertension comes in for pain, right sided chest pain, nausea and vomiting with cough and shortness of breath. Continues to have very loose stools.     Assessment/Plan:   Intractable nausea vomiting and abdominal pain probable hospital-acquired pneumonia: -treated -1 blood cultures grew staph epi, likely contaminant. Continue famotidine for twice a day continue Zofran.  Acute kidney injury superimposed on chronic kidney disease stage II: Likely due to sepsis creatinine improved to baseline.   Hypokalemia -replete  Essential HTN (hypertension) Diuretic and ACE inhibitor were held on admission in the setting of acute kidney injury. Blood pressure is trending up restart ACE inhibitor and diuretics.  Sinus tachycardia Likely due to hypovolemia resolved with IV fluids.  History of C. difficile colitis: Treating her with oral vancomycin for prophylaxis as she is currently on antibiotics for pneumonia. Still having watery stools. She will complete her antibiotic treatment for pneumonia on 06/24/2023. She will continue Vanco orally for C. difficile prophylaxis 1 week post finishing her pneumonia treatment - may need additional taper pending how her stools do   DVT prophylaxis: lovenox Family Communication:non Status is: Inpatient Remains inpatient appropriate because: monitor stools    Code Status:     full    Subjective:   Still having bouts of watery stools  Objective:    Vitals:   06/25/23 0440 06/25/23 0754 06/25/23 0828 06/25/23 1021  BP: (!) 150/80 (!) 178/79 (!) 159/75 (!) 159/75  Pulse: 72 84 76 76  Resp: 18     Temp:  98.5 F (36.9 C) 98.7 F (37.1 C)    TempSrc: Oral Oral    SpO2: 95% 93%    Weight:      Height:       SpO2: 93 %  No intake or output data in the 24 hours ending 06/25/23 1104   Filed Weights   06/19/23 2220  Weight: 66.4 kg    Exam:  General: Appearance:    Well developed, well nourished female in no acute distress     Lungs:      respirations unlabored  Heart:    Normal heart rate.    MS:   All extremities are intact.   Neurologic:   Awake, alert    Data Reviewed:    Labs: Basic Metabolic Panel: Recent Labs  Lab 06/19/23 0933 06/19/23 1844 06/20/23 0631 06/22/23 0444 06/25/23 0852  NA 138  --  141 140 141  K 2.9*  --  3.2* 4.1 3.1*  CL 98  --  110 112* 104  CO2 22  --  21* 21* 27  GLUCOSE 152*  --  102* 91 127*  BUN 15  --  12 <5* <5*  CREATININE 1.05* 0.94 0.75 0.89 0.90  CALCIUM 8.9  --  7.2* 7.7* 8.5*   GFR Estimated Creatinine Clearance: 49.8 mL/min (by C-G formula based on SCr of 0.9 mg/dL). Liver Function Tests: Recent Labs  Lab 06/19/23 0933  AST 16  ALT 9  ALKPHOS 106  BILITOT 1.6*  PROT 6.7  ALBUMIN 3.0*   Recent Labs  Lab 06/19/23 0933  LIPASE 25   No results for input(s): "AMMONIA" in the last  168 hours. Coagulation profile No results for input(s): "INR", "PROTIME" in the last 168 hours. COVID-19 Labs  No results for input(s): "DDIMER", "FERRITIN", "LDH", "CRP" in the last 72 hours.  Lab Results  Component Value Date   SARSCOV2NAA NEGATIVE 06/19/2023   SARSCOV2NAA NEGATIVE 07/27/2020   SARSCOV2NAA NEGATIVE 04/27/2020    CBC: Recent Labs  Lab 06/19/23 0933 06/19/23 1844 06/20/23 0631 06/25/23 0852  WBC 19.1* 15.8* 12.6* 7.3  HGB 13.1 11.3* 10.2* 11.1*  HCT 40.8 35.7* 32.4* 34.3*  MCV 90.1 91.5 91.8 90.3  PLT 254 204 217 363   Cardiac Enzymes: No results for input(s): "CKTOTAL", "CKMB", "CKMBINDEX", "TROPONINI" in the last 168 hours. BNP (last 3 results) No results for input(s): "PROBNP" in the last 8760  hours. CBG: No results for input(s): "GLUCAP" in the last 168 hours. D-Dimer: No results for input(s): "DDIMER" in the last 72 hours. Hgb A1c: No results for input(s): "HGBA1C" in the last 72 hours. Lipid Profile: No results for input(s): "CHOL", "HDL", "LDLCALC", "TRIG", "CHOLHDL", "LDLDIRECT" in the last 72 hours. Thyroid function studies: No results for input(s): "TSH", "T4TOTAL", "T3FREE", "THYROIDAB" in the last 72 hours.  Invalid input(s): "FREET3" Anemia work up: No results for input(s): "VITAMINB12", "FOLATE", "FERRITIN", "TIBC", "IRON", "RETICCTPCT" in the last 72 hours. Sepsis Labs: Recent Labs  Lab 06/19/23 0933 06/19/23 1844 06/20/23 0631 06/25/23 0852  WBC 19.1* 15.8* 12.6* 7.3   Microbiology Recent Results (from the past 240 hours)  Blood culture (routine x 2)     Status: None   Collection Time: 06/19/23  6:38 PM   Specimen: BLOOD LEFT HAND  Result Value Ref Range Status   Specimen Description BLOOD LEFT HAND  Final   Special Requests   Final    BOTTLES DRAWN AEROBIC ONLY Blood Culture results may not be optimal due to an inadequate volume of blood received in culture bottles   Culture   Final    NO GROWTH 5 DAYS Performed at [facility], [address], [place], [place] [address]    Report Status 06/24/2023 FINAL  Final  Blood culture (routine x 2)     Status: Abnormal   Collection Time: 06/19/23  6:44 PM   Specimen: BLOOD RIGHT HAND  Result Value Ref Range Status   Specimen Description BLOOD RIGHT HAND  Final   Special Requests   Final    BOTTLES DRAWN AEROBIC AND ANAEROBIC Blood Culture adequate volume   Culture  Setup Time   Final    GRAM POSITIVE COCCI AEROBIC BOTTLE ONLY CRITICAL RESULT CALLED TO, READ BACK BY AND VERIFIED WITH: PHARMD EMMA PAYTES ON 06/20/23 @ 2115 BY DRT    Culture (A)  Final    COAGULASE NEGATIVE STAPHYLOCOCCUS THE SIGNIFICANCE OF ISOLATING THIS ORGANISM FROM A SINGLE SET OF BLOOD CULTURES WHEN MULTIPLE SETS ARE DRAWN  IS UNCERTAIN. PLEASE NOTIFY THE MICROBIOLOGY DEPARTMENT WITHIN ONE WEEK IF SPECIATION AND SENSITIVITIES ARE REQUIRED. Performed at [facility], [address], [place], [place] [address]    Report Status 06/22/2023 FINAL  Final  Blood Culture ID Panel (Reflexed)     Status: Abnormal   Collection Time: 06/19/23  6:44 PM  Result Value Ref Range Status   Enterococcus faecalis NOT DETECTED NOT DETECTED Final   Enterococcus Faecium NOT DETECTED NOT DETECTED Final   Listeria monocytogenes NOT DETECTED NOT DETECTED Final   Staphylococcus species DETECTED (A) NOT DETECTED Final    Comment: CRITICAL RESULT CALLED TO, READ BACK BY AND VERIFIED  WITH: PHARMD EMMA PAYTES ON 06/20/23 @ 2115 BY DRT    Staphylococcus aureus (BCID) NOT DETECTED NOT DETECTED Final   Staphylococcus epidermidis NOT DETECTED NOT DETECTED Final   Staphylococcus lugdunensis NOT DETECTED NOT DETECTED Final   Streptococcus species NOT DETECTED NOT DETECTED Final   Streptococcus agalactiae NOT DETECTED NOT DETECTED Final   Streptococcus pneumoniae NOT DETECTED NOT DETECTED Final   Streptococcus pyogenes NOT DETECTED NOT DETECTED Final   A.calcoaceticus-baumannii NOT DETECTED NOT DETECTED Final   Bacteroides fragilis NOT DETECTED NOT DETECTED Final   Enterobacterales NOT DETECTED NOT DETECTED Final   Enterobacter cloacae complex NOT DETECTED NOT DETECTED Final   Escherichia coli NOT DETECTED NOT DETECTED Final   Klebsiella aerogenes NOT DETECTED NOT DETECTED Final   Klebsiella oxytoca NOT DETECTED NOT DETECTED Final   Klebsiella pneumoniae NOT DETECTED NOT DETECTED Final   Proteus species NOT DETECTED NOT DETECTED Final   Salmonella species NOT DETECTED NOT DETECTED Final   Serratia marcescens NOT DETECTED NOT DETECTED Final   Haemophilus influenzae NOT DETECTED NOT DETECTED Final   Neisseria meningitidis NOT DETECTED NOT DETECTED Final   Pseudomonas aeruginosa NOT DETECTED NOT DETECTED Final   Stenotrophomonas  maltophilia NOT DETECTED NOT DETECTED Final   Candida albicans NOT DETECTED NOT DETECTED Final   Candida auris NOT DETECTED NOT DETECTED Final   Candida glabrata NOT DETECTED NOT DETECTED Final   Candida krusei NOT DETECTED NOT DETECTED Final   Candida parapsilosis NOT DETECTED NOT DETECTED Final   Candida tropicalis NOT DETECTED NOT DETECTED Final   Cryptococcus neoformans/gattii NOT DETECTED NOT DETECTED Final    Comment: Performed at [facility], [address] [place], [place] [address]  Resp panel by RT-PCR (RSV, Flu A&B, Covid) Anterior Nasal Swab     Status: None   Collection Time: 06/19/23  6:48 PM   Specimen: Anterior Nasal Swab  Result Value Ref Range Status   SARS Coronavirus 2 by RT PCR NEGATIVE NEGATIVE Final   Influenza A by PCR NEGATIVE NEGATIVE Final   Influenza B by PCR NEGATIVE NEGATIVE Final    Comment: (NOTE) The Xpert Xpress SARS-CoV-2/FLU/RSV plus assay is intended as an aid in the diagnosis of influenza from Nasopharyngeal swab specimens and should not be used as a sole basis for treatment. Nasal washings and aspirates are unacceptable for Xpert Xpress SARS-CoV-2/FLU/RSV testing.  Fact Sheet for Patients: BloggerCourse.com  Fact Sheet for Healthcare Providers: SeriousBroker.it  This test is not yet approved or cleared by the [place] FDA and has been authorized for detection and/or diagnosis of SARS-CoV-2 by FDA under an Emergency Use Authorization (EUA). This EUA will remain in effect (meaning this test can be used) for the duration of the COVID-19 declaration under Section 564(b)(1) of the Act, 21 U.S.C. section 360bbb-3(b)(1), unless the authorization is terminated or revoked.     Resp Syncytial Virus by PCR NEGATIVE NEGATIVE Final    Comment: (NOTE) Fact Sheet for Patients: BloggerCourse.com  Fact Sheet for Healthcare  Providers: SeriousBroker.it  This test is not yet approved or cleared by the [place] FDA and has been authorized for detection and/or diagnosis of SARS-CoV-2 by FDA under an Emergency Use Authorization (EUA). This EUA will remain in effect (meaning this test can be used) for the duration of the COVID-19 declaration under Section 564(b)(1) of the Act, 21 U.S.C. section 360bbb-3(b)(1), unless the authorization is terminated or revoked.  Performed at [facility], [address], [place], [place] [address]   MRSA  Next Gen by PCR, Nasal     Status: None   Collection Time: 06/20/23  8:37 AM   Specimen: Nasal Mucosa; Nasal Swab  Result Value Ref Range Status   MRSA by PCR Next Gen NOT DETECTED NOT DETECTED Final    Comment: (NOTE) The GeneXpert MRSA Assay (FDA approved for NASAL specimens only), is one component of a comprehensive MRSA colonization surveillance program. It is not intended to diagnose MRSA infection nor to guide or monitor treatment for MRSA infections. Test performance is not FDA approved in patients less than 35 years old. Performed at [facility], [address], [place], [place] [address]      Medications:    acidophilus  2 capsule Oral Daily   aspirin EC  81 mg Oral Daily   enoxaparin (LOVENOX) injection  40 mg Subcutaneous Q24H   escitalopram  10 mg Oral Daily   famotidine  40 mg Oral BID   lisinopril  20 mg Oral Daily   And   hydrochlorothiazide  25 mg Oral Daily   metoprolol succinate  25 mg Oral Daily   pantoprazole  40 mg Oral BID   rosuvastatin  5 mg Oral Daily   vancomycin  125 mg Oral TID   Continuous Infusions:      LOS: 6 days   Joseph Art  [facility]  06/25/2023, 11:04 AM

## 2023-06-25 NOTE — Plan of Care (Signed)

## 2023-06-26 ENCOUNTER — Other Ambulatory Visit (HOSPITAL_COMMUNITY): Payer: Self-pay

## 2023-06-26 DIAGNOSIS — R112 Nausea with vomiting, unspecified: Secondary | ICD-10-CM | POA: Diagnosis not present

## 2023-06-26 DIAGNOSIS — R109 Unspecified abdominal pain: Secondary | ICD-10-CM | POA: Diagnosis not present

## 2023-06-26 LAB — BASIC METABOLIC PANEL
Anion gap: 9 (ref 5–15)
BUN: <5 mg/dL — ABNORMAL LOW (ref 8–23)
CO2: 26 mmol/L (ref 22–32)
Calcium: 8.3 mg/dL — ABNORMAL LOW (ref 8.9–10.3)
Chloride: 107 mmol/L (ref 98–111)
Creatinine, Ser: 0.83 mg/dL (ref 0.44–1.00)
GFR, Estimated: >60 mL/min (ref >60)
Glucose, Bld: 108 mg/dL — ABNORMAL HIGH (ref 70–99)
Potassium: 3.2 mmol/L — ABNORMAL LOW (ref 3.5–5.1)
Sodium: 142 mmol/L (ref 135–145)

## 2023-06-26 MED ORDER — ACIDOPHILUS PO CAPS
2.0000 | ORAL_CAPSULE | Freq: Every day | ORAL | 2 refills | Status: AC
  Filled 2023-06-26 – 2023-07-26 (×3): qty 60, 30d supply, fill #0
  Filled 2023-08-20: qty 60, 30d supply, fill #1

## 2023-06-26 MED ORDER — LISINOPRIL 20 MG PO TABS
20.0000 mg | ORAL_TABLET | Freq: Every day | ORAL | 1 refills | Status: AC
  Filled 2023-06-26: qty 30, 30d supply, fill #0

## 2023-06-26 MED ORDER — VANCOMYCIN HCL 125 MG PO CAPS
125.0000 mg | ORAL_CAPSULE | Freq: Three times a day (TID) | ORAL | 0 refills | Status: AC
  Filled 2023-06-26: qty 21, 7d supply, fill #0

## 2023-06-26 MED ORDER — ONDANSETRON 4 MG PO TBDP
4.0000 mg | ORAL_TABLET | Freq: Three times a day (TID) | ORAL | 0 refills | Status: AC | PRN
  Filled 2023-06-26: qty 15, 5d supply, fill #0

## 2023-06-26 NOTE — Progress Notes (Signed)
PT Cancellation Note  Patient Details Name: Kimberly Beck MRN: [id number] DOB: 1946-03-19   Cancelled Treatment:    Reason Eval/Treat Not Completed: (P) Other (comment). Pt reports she is discharging home in ~20 minutes and reports she does not have any questions/concerns for PT at this time, politely declining need for PT prior to discharging today. If pt ends up not discharging today, will plan to follow-up another day as able.   Virgil Benedict, PT, DPT Acute Rehabilitation Services  Office: [phone]    Bettina Gavia 06/26/2023, 10:50 AM

## 2023-06-26 NOTE — Plan of Care (Signed)

## 2023-06-26 NOTE — Discharge Summary (Signed)
Physician Discharge Summary  Kimberly Beck ZOX:[id number] DOB: 1945-11-21 DOA: 06/19/2023  PCP: Jarrett Soho, PA-C  Admit date: 06/19/2023 Discharge date: 06/26/2023  Admitted From: home Discharge disposition: home   Recommendations for Outpatient Follow-Up:   Please follow up with in7 days from d/c-- may need taper of PO vanc if continues to have issues with stool-- GI vs ID referral Added lactobacillus    Discharge Diagnosis:   Principal Problem:   Pneumonia Active Problems:   HTN (hypertension)   Sinus tachycardia   Acute kidney injury superimposed on chronic kidney disease (HCC)    Discharge Condition: Improved.  Diet recommendation: Low sodium, heart healthy  Wound care: None.  Code status: Full.   History of Present Illness:   Kimberly Beck is a 77 y.o. female past medical history of recently treated C. difficile colitis with her last dose on 06/16/2023, history of lung cancer in remission essential hypertension who comes in as when she is finished her C. difficile treatment she started having abdominal pain and persistent nausea and vomiting not able to keep anything down has not had a bowel movement is not passing gas.  She relates some new cough and shortness of breath with exertion. She relates her flank pain is pleuritic in nature.  Denies any fever or sick contacts.     The ED: She was found to be mildly tachycardic satting 100% on room air white count of 19,000, troponins are flat.  CT scan of the abdomen pelvis showed mild colitis which is improving but showed trace right-sided pleural effusion with patchy right lower lobe airspace disease     Hospital Course by Problem:   Intractable nausea vomiting and abdominal pain probable hospital-acquired pneumonia: -treated -1 blood cultures grew staph epi, likely contaminant. 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     Hypokalemia -repleted   Essential HTN (hypertension) Diuretic and ACE inhibitor were held on admission in the setting of acute kidney injury. --dc hydrochlorothiazide for hypokalemia   Sinus tachycardia Likely due to hypovolemia resolved with IV fluids.   History of C. difficile colitis: Treating her with oral vancomycin for prophylaxis as she is currently on antibiotics for pneumonia. She will complete her antibiotic treatment for pneumonia on 06/24/2023. She will continue Vanco orally for C. difficile prophylaxis 1 week post finishing her pneumonia treatment - may need additional taper pending how her stools do-- to follow up with PCP      Medical Consultants:      Discharge Exam:   Vitals:   06/25/23 2056 06/26/23 0415  BP: (!) 162/74 (!) 154/77  Pulse: 78 73  Resp:    Temp: 98.5 F (36.9 C) 98.4 F (36.9 C)  SpO2: 96% 97%   Vitals:   06/25/23 1021 06/25/23 1500 06/25/23 2056 06/26/23 0415  BP: (!) 159/75 (!) 140/81 (!) 162/74 (!) 154/77  Pulse: 76 83 78 73  Resp:      Temp:  97.8 F (36.6 C) 98.5 F (36.9 C) 98.4 F (36.9 C)  TempSrc:  Oral Oral Oral  SpO2:   96% 97%  Weight:      Height:        General exam: Appears calm and comfortable.    The results of significant diagnostics from this hospitalization (including imaging, microbiology, ancillary and laboratory) are listed below for reference.     Procedures and Diagnostic Studies:  DG Chest Portable 1 View Result Date: 06/19/2023 CLINICAL DATA:  Leukocytosis EXAM: PORTABLE CHEST 1 VIEW COMPARISON:  05/17/2023 FINDINGS: The heart size and mediastinal contours are within normal limits. Aortic atherosclerosis. Streaky right basilar opacity. Band-like left basilar opacity. Upper lung fields are clear. No pleural effusion or pneumothorax. The visualized skeletal structures are unremarkable. IMPRESSION: Streaky right basilar opacity, atelectasis versus  pneumonia. Electronically Signed   By: Duanne Guess D.O.   On: 06/19/2023 19:29   CT ABDOMEN PELVIS W CONTRAST Result Date: 06/19/2023 CLINICAL DATA:  Abdominal pain, acute, nonlocalized EXAM: CT ABDOMEN AND PELVIS WITH CONTRAST TECHNIQUE: Multidetector CT imaging of the abdomen and pelvis was performed using the standard protocol following bolus administration of intravenous contrast. RADIATION DOSE REDUCTION: This exam was performed according to the departmental dose-optimization program which includes automated exposure control, adjustment of the mA and/or kV according to patient size and/or use of iterative reconstruction technique. CONTRAST:  75mL OMNIPAQUE IOHEXOL 350 MG/ML SOLN COMPARISON:  06/02/2023 FINDINGS: Lower chest: Trace right pleural effusion. Patchy right lower lobe airspace opacity. Minimal left basilar subsegmental atelectasis. Heart size is normal. Aortic and coronary artery atherosclerosis. Hepatobiliary: Scattered low-density lesions within the liver are stable in appearance from prior, likely cysts. Unremarkable gallbladder. No hyperdense gallstone. No biliary dilatation. Pancreas: Unremarkable. No pancreatic ductal dilatation or surrounding inflammatory changes. Spleen: Normal in size without focal abnormality. Adrenals/Urinary Tract: Unremarkable adrenal glands. Kidneys enhance symmetrically without focal lesion, stone, or hydronephrosis. Ureters are nondilated. Urinary bladder appears unremarkable for the degree of distention. Stomach/Bowel: Small hiatal hernia. Stomach otherwise unremarkable. No dilated loops of bowel to suggest obstruction. Normal appendix in the right hemiabdomen (series 6, image 41). Scattered colonic diverticulosis. No focally inflamed diverticulum. Mild long segment wall thickening of the descending and sigmoid colon, improving compared to the prior study. Vascular/Lymphatic: Aortic atherosclerosis. No enlarged abdominal or pelvic lymph nodes. Reproductive:  Uterus within normal limits. 1.4 cm simple right adnexal cyst. No follow-up imaging recommended. Note: This recommendation does not apply to premenarchal patients and to those with increased risk (genetic, family history, elevated tumor markers or other high-risk factors) of ovarian cancer. Reference: JACR 2020 Feb; 17(2):248-254. Unremarkable left adnexum. Other: No free fluid. No abdominopelvic fluid collection. No pneumoperitoneum. No abdominal wall hernia. Musculoskeletal: Chronic compression deformities of the T12 and L1 vertebral bodies, unchanged. No acute bony abnormality. IMPRESSION: 1. Mild colitis involving the descending and sigmoid colon, improving compared to the prior study. 2. Colonic diverticulosis without evidence of acute diverticulitis. 3. Trace right pleural effusion. Patchy right lower lobe airspace opacity, which may represent atelectasis or pneumonia. 4. Small hiatal hernia. 5. Aortic and coronary artery atherosclerosis (ICD10-I70.0). Electronically Signed   By: Duanne Guess D.O.   On: 06/19/2023 16:36     Labs:   Basic Metabolic Panel: Recent Labs  Lab 06/19/23 0933 06/19/23 1844 06/20/23 0631 06/22/23 0444 06/25/23 0852 06/26/23 0552  NA 138  --  141 140 141 142  K 2.9*  --  3.2* 4.1 3.1* 3.2*  CL 98  --  110 112* 104 107  CO2 22  --  21* 21* 27 26  GLUCOSE 152*  --  102* 91 127* 108*  BUN 15  --  12 <5* <5* <5*  CREATININE 1.05* 0.94 0.75 0.89 0.90 0.83  CALCIUM 8.9  --  7.2* 7.7* 8.5* 8.3*   GFR Estimated Creatinine Clearance: 54 mL/min (by C-G formula based on SCr of 0.83 mg/dL). Liver Function Tests: Recent Labs  Lab 06/19/23 [phone]  AST 16  ALT 9  ALKPHOS 106  BILITOT 1.6*  PROT 6.7  ALBUMIN 3.0*   Recent Labs  Lab 06/19/23 0933  LIPASE 25   No results for input(s): "AMMONIA" in the last 168 hours. Coagulation profile No results for input(s): "INR", "PROTIME" in the last 168 hours.  CBC: Recent Labs  Lab 06/19/23 0933 06/19/23 1844  06/20/23 0631 06/25/23 0852  WBC 19.1* 15.8* 12.6* 7.3  HGB 13.1 11.3* 10.2* 11.1*  HCT 40.8 35.7* 32.4* 34.3*  MCV 90.1 91.5 91.8 90.3  PLT 254 204 217 363   Cardiac Enzymes: No results for input(s): "CKTOTAL", "CKMB", "CKMBINDEX", "TROPONINI" in the last 168 hours. BNP: Invalid input(s): "POCBNP" CBG: No results for input(s): "GLUCAP" in the last 168 hours. D-Dimer No results for input(s): "DDIMER" in the last 72 hours. Hgb A1c No results for input(s): "HGBA1C" in the last 72 hours. Lipid Profile No results for input(s): "CHOL", "HDL", "LDLCALC", "TRIG", "CHOLHDL", "LDLDIRECT" in the last 72 hours. Thyroid function studies No results for input(s): "TSH", "T4TOTAL", "T3FREE", "THYROIDAB" in the last 72 hours.  Invalid input(s): "FREET3" Anemia work up No results for input(s): "VITAMINB12", "FOLATE", "FERRITIN", "TIBC", "IRON", "RETICCTPCT" in the last 72 hours. Microbiology Recent Results (from the past 240 hours)  Blood culture (routine x 2)     Status: None   Collection Time: 06/19/23  6:38 PM   Specimen: BLOOD LEFT HAND  Result Value Ref Range Status   Specimen Description BLOOD LEFT HAND  Final   Special Requests   Final    BOTTLES DRAWN AEROBIC ONLY Blood Culture results may not be optimal due to an inadequate volume of blood received in culture bottles   Culture   Final    NO GROWTH 5 DAYS Performed at [facility], [address], [place], [place] [address]    Report Status 06/24/2023 FINAL  Final  Blood culture (routine x 2)     Status: Abnormal   Collection Time: 06/19/23  6:44 PM   Specimen: BLOOD RIGHT HAND  Result Value Ref Range Status   Specimen Description BLOOD RIGHT HAND  Final   Special Requests   Final    BOTTLES DRAWN AEROBIC AND ANAEROBIC Blood Culture adequate volume   Culture  Setup Time   Final    GRAM POSITIVE COCCI AEROBIC BOTTLE ONLY CRITICAL RESULT CALLED TO, READ BACK BY AND VERIFIED WITH: PHARMD EMMA PAYTES ON 06/20/23 @ 2115 BY  DRT    Culture (A)  Final    COAGULASE NEGATIVE STAPHYLOCOCCUS THE SIGNIFICANCE OF ISOLATING THIS ORGANISM FROM A SINGLE SET OF BLOOD CULTURES WHEN MULTIPLE SETS ARE DRAWN IS UNCERTAIN. PLEASE NOTIFY THE MICROBIOLOGY DEPARTMENT WITHIN ONE WEEK IF SPECIATION AND SENSITIVITIES ARE REQUIRED. Performed at [facility], [address] [place], [place] [address]    Report Status 06/22/2023 FINAL  Final  Blood Culture ID Panel (Reflexed)     Status: Abnormal   Collection Time: 06/19/23  6:44 PM  Result Value Ref Range Status   Enterococcus faecalis NOT DETECTED NOT DETECTED Final   Enterococcus Faecium NOT DETECTED NOT DETECTED Final   Listeria monocytogenes NOT DETECTED NOT DETECTED Final   Staphylococcus species DETECTED (A) NOT DETECTED Final    Comment: CRITICAL RESULT CALLED TO, READ BACK BY AND VERIFIED WITH: PHARMD EMMA PAYTES ON 06/20/23 @ 2115 BY DRT    Staphylococcus aureus (BCID) NOT DETECTED NOT DETECTED Final   Staphylococcus epidermidis NOT DETECTED NOT DETECTED Final   Staphylococcus lugdunensis  NOT DETECTED NOT DETECTED Final   Streptococcus species NOT DETECTED NOT DETECTED Final   Streptococcus agalactiae NOT DETECTED NOT DETECTED Final   Streptococcus pneumoniae NOT DETECTED NOT DETECTED Final   Streptococcus pyogenes NOT DETECTED NOT DETECTED Final   A.calcoaceticus-baumannii NOT DETECTED NOT DETECTED Final   Bacteroides fragilis NOT DETECTED NOT DETECTED Final   Enterobacterales NOT DETECTED NOT DETECTED Final   Enterobacter cloacae complex NOT DETECTED NOT DETECTED Final   Escherichia coli NOT DETECTED NOT DETECTED Final   Klebsiella aerogenes NOT DETECTED NOT DETECTED Final   Klebsiella oxytoca NOT DETECTED NOT DETECTED Final   Klebsiella pneumoniae NOT DETECTED NOT DETECTED Final   Proteus species NOT DETECTED NOT DETECTED Final   Salmonella species NOT DETECTED NOT DETECTED Final   Serratia marcescens NOT DETECTED NOT DETECTED Final   Haemophilus  influenzae NOT DETECTED NOT DETECTED Final   Neisseria meningitidis NOT DETECTED NOT DETECTED Final   Pseudomonas aeruginosa NOT DETECTED NOT DETECTED Final   Stenotrophomonas maltophilia NOT DETECTED NOT DETECTED Final   Candida albicans NOT DETECTED NOT DETECTED Final   Candida auris NOT DETECTED NOT DETECTED Final   Candida glabrata NOT DETECTED NOT DETECTED Final   Candida krusei NOT DETECTED NOT DETECTED Final   Candida parapsilosis NOT DETECTED NOT DETECTED Final   Candida tropicalis NOT DETECTED NOT DETECTED Final   Cryptococcus neoformans/gattii NOT DETECTED NOT DETECTED Final    Comment: Performed at [facility], [address] [place], [place] [address]  Resp panel by RT-PCR (RSV, Flu A&B, Covid) Anterior Nasal Swab     Status: None   Collection Time: 06/19/23  6:48 PM   Specimen: Anterior Nasal Swab  Result Value Ref Range Status   SARS Coronavirus 2 by RT PCR NEGATIVE NEGATIVE Final   Influenza A by PCR NEGATIVE NEGATIVE Final   Influenza B by PCR NEGATIVE NEGATIVE Final    Comment: (NOTE) The Xpert Xpress SARS-CoV-2/FLU/RSV plus assay is intended as an aid in the diagnosis of influenza from Nasopharyngeal swab specimens and should not be used as a sole basis for treatment. Nasal washings and aspirates are unacceptable for Xpert Xpress SARS-CoV-2/FLU/RSV testing.  Fact Sheet for Patients: BloggerCourse.com  Fact Sheet for Healthcare Providers: SeriousBroker.it  This test is not yet approved or cleared by the [place] FDA and has been authorized for detection and/or diagnosis of SARS-CoV-2 by FDA under an Emergency Use Authorization (EUA). This EUA will remain in effect (meaning this test can be used) for the duration of the COVID-19 declaration under Section 564(b)(1) of the Act, 21 U.S.C. section 360bbb-3(b)(1), unless the authorization is terminated or revoked.     Resp Syncytial Virus by PCR NEGATIVE  NEGATIVE Final    Comment: (NOTE) Fact Sheet for Patients: BloggerCourse.com  Fact Sheet for Healthcare Providers: SeriousBroker.it  This test is not yet approved or cleared by the [place] FDA and has been authorized for detection and/or diagnosis of SARS-CoV-2 by FDA under an Emergency Use Authorization (EUA). This EUA will remain in effect (meaning this test can be used) for the duration of the COVID-19 declaration under Section 564(b)(1) of the Act, 21 U.S.C. section 360bbb-3(b)(1), unless the authorization is terminated or revoked.  Performed at [facility], [address] [place], [place] [address]   MRSA Next Gen by PCR, Nasal     Status: None   Collection Time: 06/20/23  8:37 AM   Specimen: Nasal Mucosa; Nasal Swab  Result Value Ref Range Status  MRSA by PCR Next Gen NOT DETECTED NOT DETECTED Final    Comment: (NOTE) The GeneXpert MRSA Assay (FDA approved for NASAL specimens only), is one component of a comprehensive MRSA colonization surveillance program. It is not intended to diagnose MRSA infection nor to guide or monitor treatment for MRSA infections. Test performance is not FDA approved in patients less than 64 years old. Performed at [facility], [address], [place], [place] [address]      Discharge Instructions:   Discharge Instructions     Diet general   Complete by: As directed    Increase activity slowly   Complete by: As directed       Allergies as of 06/26/2023       Reactions   Codeine Itching   Drunk feeling    Nsaids Other (See Comments)   Liver    Other Diarrhea   headache, Global anesthesia, edema   Penicillins Rash   Dry rash        Medication List     STOP taking these medications    lisinopril-hydrochlorothiazide 20-25 MG tablet Commonly known as: ZESTORETIC       TAKE these medications    acidophilus Caps capsule Take 2 capsules by mouth  daily.   albuterol 108 (90 Base) MCG/ACT inhaler Commonly known as: VENTOLIN HFA Inhale 2 puffs into the lungs every 6 (six) hours as needed for wheezing or shortness of breath.   aspirin EC 81 MG tablet Take 81 mg by mouth daily. Swallow whole.   Benadryl Allergy 25 MG tablet Generic drug: diphenhydrAMINE Take 25 mg by mouth daily as needed for allergies.   Biofreeze Cool The Pain 4 % Gel Generic drug: Menthol (Topical Analgesic) Apply 1 application  topically as needed (pain).   calcium citrate 950 (200 Ca) MG tablet Commonly known as: CALCITRATE - dosed in mg elemental calcium Take 200 mg of elemental calcium by mouth daily.   escitalopram 10 MG tablet Commonly known as: LEXAPRO Take 10 mg by mouth daily.   famotidine 20 MG tablet Commonly known as: PEPCID Take 1 tablet (20 mg total) by mouth daily.   lidocaine 5 % Commonly known as: Lidoderm Place 1 patch onto the skin daily. Remove & Discard patch within 12 hours or as directed by MD What changed:  when to take this reasons to take this additional instructions   lisinopril 20 MG tablet Commonly known as: ZESTRIL Take 1 tablet (20 mg total) by mouth daily.   metoprolol succinate 25 MG 24 hr tablet Commonly known as: TOPROL-XL TAKE 1 TABLET BY MOUTH DAILY   montelukast 10 MG tablet Commonly known as: SINGULAIR Take 10 mg by mouth daily.   Multivitamin Women 50+ Tabs Take 1 tablet by mouth daily.   ondansetron 4 MG disintegrating tablet Commonly known as: ZOFRAN-ODT Take 1 tablet (4 mg total) by mouth every 8 (eight) hours as needed for nausea or vomiting.   oxyCODONE 5 MG immediate release tablet Commonly known as: Oxy IR/ROXICODONE Take 5-10 mg by mouth every 6 (six) hours as needed for severe pain (pain score 7-10) or moderate pain (pain score 4-6).   rosuvastatin 5 MG tablet Commonly known as: CRESTOR Take 1 tablet by mouth daily.   TYLENOL 500 MG tablet Generic drug: acetaminophen Take 1,000 mg  by mouth as needed for mild pain (pain score 1-3), moderate pain (pain score 4-6) or headache.   vancomycin 125 MG capsule Commonly known as: VANCOCIN Take 1 capsule (125  mg total) by mouth 3 (three) times daily.   Vitamin D 50 MCG (2000 UT) Caps Take 2,000 Units by mouth daily.        Follow-up Information     [facility] Follow up.   Specialty: Home Health Services Why: [facility] will be providing home health services.  They will call you to set up home health services when you return home. Contact information: [address] [place] [address] [phone]         Jarrett Soho, PA-C Follow up.   Specialty: Family Medicine Contact information: [address] [place] [address] [phone]                  Time coordinating discharge: 45 min  Signed:  Joseph Art DO  [facility] 06/26/2023, 8:30 AM

## 2023-06-26 NOTE — Plan of Care (Signed)

## 2023-06-29 DIAGNOSIS — N189 Chronic kidney disease, unspecified: Secondary | ICD-10-CM | POA: Diagnosis not present

## 2023-06-29 DIAGNOSIS — D631 Anemia in chronic kidney disease: Secondary | ICD-10-CM | POA: Diagnosis not present

## 2023-06-29 DIAGNOSIS — E7800 Pure hypercholesterolemia, unspecified: Secondary | ICD-10-CM | POA: Diagnosis not present

## 2023-06-29 DIAGNOSIS — I129 Hypertensive chronic kidney disease with stage 1 through stage 4 chronic kidney disease, or unspecified chronic kidney disease: Secondary | ICD-10-CM | POA: Diagnosis not present

## 2023-06-29 DIAGNOSIS — S42201A Unspecified fracture of upper end of right humerus, initial encounter for closed fracture: Secondary | ICD-10-CM | POA: Diagnosis not present

## 2023-07-09 ENCOUNTER — Ambulatory Visit (HOSPITAL_COMMUNITY)
Admission: RE | Admit: 2023-07-09 | Discharge: 2023-07-09 | Disposition: A | Discharge status: Home / Self Care | Payer: Medicare Other | Source: Ambulatory Visit | Attending: Orthopedic Surgery | Admitting: Orthopedic Surgery

## 2023-07-09 DIAGNOSIS — M545 Low back pain, unspecified: Secondary | ICD-10-CM | POA: Insufficient documentation

## 2023-07-09 DIAGNOSIS — M4856XA Collapsed vertebra, not elsewhere classified, lumbar region, initial encounter for fracture: Secondary | ICD-10-CM | POA: Diagnosis not present

## 2023-07-09 DIAGNOSIS — M48061 Spinal stenosis, lumbar region without neurogenic claudication: Secondary | ICD-10-CM | POA: Diagnosis not present

## 2023-07-09 DIAGNOSIS — M4805 Spinal stenosis, thoracolumbar region: Secondary | ICD-10-CM | POA: Diagnosis not present

## 2023-07-09 DIAGNOSIS — M4807 Spinal stenosis, lumbosacral region: Secondary | ICD-10-CM | POA: Diagnosis not present

## 2023-07-10 DIAGNOSIS — Z6822 Body mass index (BMI) 22.0-22.9, adult: Secondary | ICD-10-CM | POA: Diagnosis not present

## 2023-07-10 DIAGNOSIS — N1831 Chronic kidney disease, stage 3a: Secondary | ICD-10-CM | POA: Diagnosis not present

## 2023-07-10 DIAGNOSIS — J432 Centrilobular emphysema: Secondary | ICD-10-CM | POA: Diagnosis not present

## 2023-07-10 DIAGNOSIS — C3491 Malignant neoplasm of unspecified part of right bronchus or lung: Secondary | ICD-10-CM | POA: Diagnosis not present

## 2023-07-10 DIAGNOSIS — M80011D Age-related osteoporosis with current pathological fracture, right shoulder, subsequent encounter for fracture with routine healing: Secondary | ICD-10-CM | POA: Diagnosis not present

## 2023-07-11 DIAGNOSIS — N189 Chronic kidney disease, unspecified: Secondary | ICD-10-CM | POA: Diagnosis not present

## 2023-07-11 DIAGNOSIS — I129 Hypertensive chronic kidney disease with stage 1 through stage 4 chronic kidney disease, or unspecified chronic kidney disease: Secondary | ICD-10-CM | POA: Diagnosis not present

## 2023-07-11 DIAGNOSIS — E7800 Pure hypercholesterolemia, unspecified: Secondary | ICD-10-CM | POA: Diagnosis not present

## 2023-07-11 DIAGNOSIS — D631 Anemia in chronic kidney disease: Secondary | ICD-10-CM | POA: Diagnosis not present

## 2023-07-11 DIAGNOSIS — S42201A Unspecified fracture of upper end of right humerus, initial encounter for closed fracture: Secondary | ICD-10-CM | POA: Diagnosis not present

## 2023-07-13 DIAGNOSIS — S42201D Unspecified fracture of upper end of right humerus, subsequent encounter for fracture with routine healing: Secondary | ICD-10-CM | POA: Diagnosis not present

## 2023-07-13 DIAGNOSIS — S22080D Wedge compression fracture of T11-T12 vertebra, subsequent encounter for fracture with routine healing: Secondary | ICD-10-CM | POA: Diagnosis not present

## 2023-07-17 DIAGNOSIS — M5416 Radiculopathy, lumbar region: Secondary | ICD-10-CM | POA: Diagnosis not present

## 2023-07-17 DIAGNOSIS — M545 Low back pain, unspecified: Secondary | ICD-10-CM | POA: Diagnosis not present

## 2023-07-18 DIAGNOSIS — I129 Hypertensive chronic kidney disease with stage 1 through stage 4 chronic kidney disease, or unspecified chronic kidney disease: Secondary | ICD-10-CM | POA: Diagnosis not present

## 2023-07-18 DIAGNOSIS — S42201A Unspecified fracture of upper end of right humerus, initial encounter for closed fracture: Secondary | ICD-10-CM | POA: Diagnosis not present

## 2023-07-18 DIAGNOSIS — E7800 Pure hypercholesterolemia, unspecified: Secondary | ICD-10-CM | POA: Diagnosis not present

## 2023-07-18 DIAGNOSIS — D631 Anemia in chronic kidney disease: Secondary | ICD-10-CM | POA: Diagnosis not present

## 2023-07-18 DIAGNOSIS — N189 Chronic kidney disease, unspecified: Secondary | ICD-10-CM | POA: Diagnosis not present

## 2023-07-26 ENCOUNTER — Other Ambulatory Visit (HOSPITAL_COMMUNITY): Payer: Self-pay

## 2023-07-26 DIAGNOSIS — K219 Gastro-esophageal reflux disease without esophagitis: Secondary | ICD-10-CM | POA: Diagnosis not present

## 2023-07-26 DIAGNOSIS — Z8619 Personal history of other infectious and parasitic diseases: Secondary | ICD-10-CM | POA: Diagnosis not present

## 2023-07-31 DIAGNOSIS — I129 Hypertensive chronic kidney disease with stage 1 through stage 4 chronic kidney disease, or unspecified chronic kidney disease: Secondary | ICD-10-CM | POA: Diagnosis not present

## 2023-07-31 DIAGNOSIS — R3129 Other microscopic hematuria: Secondary | ICD-10-CM | POA: Diagnosis not present

## 2023-07-31 DIAGNOSIS — N1832 Chronic kidney disease, stage 3b: Secondary | ICD-10-CM | POA: Diagnosis not present

## 2023-08-17 DIAGNOSIS — M546 Pain in thoracic spine: Secondary | ICD-10-CM | POA: Diagnosis not present

## 2023-08-17 DIAGNOSIS — M545 Low back pain, unspecified: Secondary | ICD-10-CM | POA: Diagnosis not present

## 2023-08-20 ENCOUNTER — Other Ambulatory Visit (HOSPITAL_COMMUNITY): Payer: Self-pay

## 2023-08-20 DIAGNOSIS — M47816 Spondylosis without myelopathy or radiculopathy, lumbar region: Secondary | ICD-10-CM | POA: Diagnosis not present

## 2023-08-21 ENCOUNTER — Other Ambulatory Visit (HOSPITAL_COMMUNITY): Payer: Self-pay

## 2023-08-24 DIAGNOSIS — S42201D Unspecified fracture of upper end of right humerus, subsequent encounter for fracture with routine healing: Secondary | ICD-10-CM | POA: Diagnosis not present

## 2023-09-06 DIAGNOSIS — M47816 Spondylosis without myelopathy or radiculopathy, lumbar region: Secondary | ICD-10-CM | POA: Diagnosis not present

## 2023-09-21 DIAGNOSIS — N1832 Chronic kidney disease, stage 3b: Secondary | ICD-10-CM | POA: Diagnosis not present

## 2023-09-23 ENCOUNTER — Other Ambulatory Visit: Payer: Self-pay | Admitting: Cardiovascular Disease

## 2023-10-03 DIAGNOSIS — N1831 Chronic kidney disease, stage 3a: Secondary | ICD-10-CM | POA: Diagnosis not present

## 2023-10-03 DIAGNOSIS — E559 Vitamin D deficiency, unspecified: Secondary | ICD-10-CM | POA: Diagnosis not present

## 2023-10-03 DIAGNOSIS — I129 Hypertensive chronic kidney disease with stage 1 through stage 4 chronic kidney disease, or unspecified chronic kidney disease: Secondary | ICD-10-CM | POA: Diagnosis not present

## 2023-10-03 DIAGNOSIS — N189 Chronic kidney disease, unspecified: Secondary | ICD-10-CM | POA: Diagnosis not present

## 2023-10-03 DIAGNOSIS — D631 Anemia in chronic kidney disease: Secondary | ICD-10-CM | POA: Diagnosis not present

## 2023-10-04 DIAGNOSIS — N1831 Chronic kidney disease, stage 3a: Secondary | ICD-10-CM | POA: Diagnosis not present

## 2023-10-04 DIAGNOSIS — I739 Peripheral vascular disease, unspecified: Secondary | ICD-10-CM | POA: Diagnosis not present

## 2023-10-04 DIAGNOSIS — E559 Vitamin D deficiency, unspecified: Secondary | ICD-10-CM | POA: Diagnosis not present

## 2023-10-04 DIAGNOSIS — I10 Essential (primary) hypertension: Secondary | ICD-10-CM | POA: Diagnosis not present

## 2023-10-09 DIAGNOSIS — M47816 Spondylosis without myelopathy or radiculopathy, lumbar region: Secondary | ICD-10-CM | POA: Diagnosis not present

## 2023-11-05 DIAGNOSIS — M47816 Spondylosis without myelopathy or radiculopathy, lumbar region: Secondary | ICD-10-CM | POA: Diagnosis not present

## 2023-11-14 DIAGNOSIS — M47816 Spondylosis without myelopathy or radiculopathy, lumbar region: Secondary | ICD-10-CM | POA: Diagnosis not present

## 2023-11-20 DIAGNOSIS — M810 Age-related osteoporosis without current pathological fracture: Secondary | ICD-10-CM | POA: Diagnosis not present

## 2023-11-28 DIAGNOSIS — M47816 Spondylosis without myelopathy or radiculopathy, lumbar region: Secondary | ICD-10-CM | POA: Diagnosis not present

## 2023-12-03 DIAGNOSIS — I10 Essential (primary) hypertension: Secondary | ICD-10-CM | POA: Diagnosis not present

## 2023-12-03 DIAGNOSIS — N1831 Chronic kidney disease, stage 3a: Secondary | ICD-10-CM | POA: Diagnosis not present

## 2023-12-14 DIAGNOSIS — M47816 Spondylosis without myelopathy or radiculopathy, lumbar region: Secondary | ICD-10-CM | POA: Diagnosis not present

## 2023-12-20 DIAGNOSIS — M47816 Spondylosis without myelopathy or radiculopathy, lumbar region: Secondary | ICD-10-CM | POA: Diagnosis not present

## 2023-12-31 DIAGNOSIS — N189 Chronic kidney disease, unspecified: Secondary | ICD-10-CM | POA: Diagnosis not present

## 2023-12-31 DIAGNOSIS — I129 Hypertensive chronic kidney disease with stage 1 through stage 4 chronic kidney disease, or unspecified chronic kidney disease: Secondary | ICD-10-CM | POA: Diagnosis not present

## 2024-01-08 ENCOUNTER — Ambulatory Visit: Attending: Cardiovascular Disease | Admitting: Cardiovascular Disease

## 2024-01-08 ENCOUNTER — Encounter: Payer: Self-pay | Admitting: Cardiovascular Disease

## 2024-01-08 VITALS — BP 130/90 | HR 58 | Ht 65.5 in | Wt 142.6 lb

## 2024-01-08 DIAGNOSIS — R931 Abnormal findings on diagnostic imaging of heart and coronary circulation: Secondary | ICD-10-CM | POA: Diagnosis not present

## 2024-01-08 DIAGNOSIS — I10 Essential (primary) hypertension: Secondary | ICD-10-CM | POA: Diagnosis not present

## 2024-01-08 DIAGNOSIS — R000 Tachycardia, unspecified: Secondary | ICD-10-CM | POA: Diagnosis not present

## 2024-01-08 DIAGNOSIS — E782 Mixed hyperlipidemia: Secondary | ICD-10-CM | POA: Diagnosis not present

## 2024-01-08 NOTE — Progress Notes (Signed)
 01/08/2024 Kimberly Beck   Mar 23, 1946  [id number]  Primary Physician Kimberly Pfeiffer, PA-C Primary Cardiologist: Kimberly [name], [name], [name], [name]  HPI:  Kimberly Beck is a 78 y.o.  mildly overweight separated Caucasian female with no children referred by Dr. Shyrl for preoperative clearance before robotic paraesophageal hernia repair of a large hiatal hernia.    I last saw her in the office 11/07/2022. 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  She smoked remotely and stopped in 1987.  She has treated hypertension.  Her father did die of a myocardial infarction at age 32.  She is never had a heart or stroke.  She denies chest pain or shortness of breath.  She had resting tachycardia for an unknown known period of time although she is unaware of this.  TSH is normal.  This was reviewed by Kimberly Don, FNP for concern that it was A. fib but it was sinus tach with PACs.   She underwent successful robotic para esophageal hernia repair 07/29/2020 by Dr. Shyrl.  She feels clinically improved.  Her heart rate has been better controlled on beta-blocker.     Since I saw her in the office over a year ago she continues to do well.  She denies chest pain or shortness of breath.  She did have a normal 2D echo in the past and a coronary calcium  score performed 06/16/2021 which was 238 with nonobstructive CAD   Current Meds  Medication Sig   Lactobacillus (ACIDOPHILUS) CAPS capsule Take 2 capsules by mouth daily.   lisinopril  (ZESTRIL ) 20 MG tablet Take 1 tablet (20 mg total) by mouth daily.   Menthol , Topical Analgesic, (BIOFREEZE COOL THE PAIN) 4 % GEL Apply 1 application  topically as needed (pain).   metoprolol  succinate (TOPROL -XL) 25 MG 24 hr tablet TAKE 1 TABLET BY MOUTH ONCE  DAILY   montelukast  (SINGULAIR ) 10 MG tablet Take 10 mg by mouth daily.   Multiple Vitamins-Minerals (MULTIVITAMIN WOMEN 50+) TABS Take 1 tablet by mouth daily.    rosuvastatin  (CRESTOR ) 5 MG tablet Take 1 tablet by mouth daily.     Allergies  Allergen Reactions   Codeine Itching    Drunk feeling    Nsaids Other (See Comments)    Liver    Other Diarrhea    headache, Global anesthesia, edema   Penicillins Rash    Dry rash    Social History   Socioeconomic History   Marital status: Single    Spouse name: Not on file   Number of children: Not on file   Years of education: Not on file   Highest education level: Not on file  Occupational History   Not on file  Tobacco Use   Smoking status: Former    Current packs/day: 0.00    Average packs/day: 3.0 packs/day for 25.0 years (75.0 ttl pk-yrs)    Types: Cigarettes    Start date: 64    Quit date: 41    Years since quitting: 38.5   Smokeless tobacco: Never  Vaping Use   Vaping status: Never Used  Substance and Sexual Activity   Alcohol use: Never   Drug use: Never   Sexual activity: Not on file  Other Topics Concern   Not on file  Social History Narrative   Not on file   Social Drivers of Health   Financial Resource Strain: Not on file  Food Insecurity: No Food Insecurity (06/19/2023)  Hunger Vital Sign    Worried About Running Out of Food in the Last Year: Never true    Ran Out of Food in the Last Year: Never true  Transportation Needs: No Transportation Needs (06/19/2023)   PRAPARE - Administrator, Civil Service (Medical): No    Lack of Transportation (Non-Medical): No  Physical Activity: Not on file  Stress: Not on file  Social Connections: Not on file  Intimate Partner Violence: Not At Risk (06/19/2023)   Humiliation, Afraid, Rape, and Kick questionnaire    Fear of Current or Ex-Partner: No    Emotionally Abused: No    Physically Abused: No    Sexually Abused: No     Review of Systems: General: negative for chills, fever, night sweats or weight changes.  Cardiovascular: negative for chest pain, dyspnea on exertion, edema, orthopnea, palpitations,  paroxysmal nocturnal dyspnea or shortness of breath Dermatological: negative for rash Respiratory: negative for cough or wheezing Urologic: negative for hematuria Abdominal: negative for nausea, vomiting, diarrhea, Beck red blood per rectum, melena, or hematemesis Neurologic: negative for visual changes, syncope, or dizziness All other systems reviewed and are otherwise negative except as noted above.    Blood pressure (!) 130/90, pulse (!) 58, height 5' 5.5 (1.664 m), weight 142 lb 9.6 oz (64.7 kg), SpO2 99%.  General appearance: alert and no distress Neck: no adenopathy, no carotid bruit, no JVD, supple, symmetrical, trachea midline, and thyroid not enlarged, symmetric, no tenderness/mass/nodules Lungs: clear to auscultation bilaterally Heart: regular rate and rhythm, S1, S2 normal, no murmur, click, rub or gallop Extremities: extremities normal, atraumatic, no cyanosis or edema Pulses: 2+ and symmetric Skin: Skin color, texture, turgor normal. No rashes or lesions Neurologic: Grossly normal  EKG EKG Interpretation Date/Time:  Tuesday January 08 2024 11:19:07 EDT Ventricular Rate:  58 PR Interval:  194 QRS Duration:  104 QT Interval:  444 QTC Calculation: 435 R Axis:   0  Text Interpretation: Sinus bradycardia Incomplete right bundle branch block When compared with ECG of 19-Jun-2023 10:07, PREVIOUS ECG IS PRESENT Confirmed by Kimberly Beck [phone]) on 01/08/2024 11:25:05 AM    ASSESSMENT AND PLAN:   HTN (hypertension) History of essential hypertension with blood pressure measured today at 130/90.  She is on lisinopril  and Toprol .  She says her blood pressure has been trending upwards recently and is being followed and treated by her PCP, Kimberly Bright, PA-C.  Hyperlipidemia History of hyperlipidemia on statin therapy with lipid profile performed 03/16/2023 Total cholesterol 147, LDL 58 and HDL 66, acceptable for secondary prevention.  Elevated coronary artery calcium   score Elevated coronary calcium  score of 238 performed 06/14/2021 with nonobstructive CAD.  She is asymptomatic and at goal for secondary prevention.     Beck Kimberly Court MD FACP,FACC,FAHA, [facility] 01/08/2024 11:32 AM

## 2024-01-08 NOTE — Assessment & Plan Note (Signed)
 History of essential hypertension with blood pressure measured today at 130/90.  She is on lisinopril  and Toprol .  She says her blood pressure has been trending upwards recently and is being followed and treated by her PCP, Charmaine Bright, PA-C.

## 2024-01-08 NOTE — Patient Instructions (Signed)

## 2024-01-08 NOTE — Assessment & Plan Note (Signed)
 History of hyperlipidemia on statin therapy with lipid profile performed 03/16/2023 Total cholesterol 147, LDL 58 and HDL 66, acceptable for secondary prevention.

## 2024-01-08 NOTE — Assessment & Plan Note (Signed)
 Elevated coronary calcium  score of 238 performed 06/14/2021 with nonobstructive CAD.  She is asymptomatic and at goal for secondary prevention.

## 2024-01-25 DIAGNOSIS — M47816 Spondylosis without myelopathy or radiculopathy, lumbar region: Secondary | ICD-10-CM | POA: Diagnosis not present

## 2024-01-31 DIAGNOSIS — I129 Hypertensive chronic kidney disease with stage 1 through stage 4 chronic kidney disease, or unspecified chronic kidney disease: Secondary | ICD-10-CM | POA: Diagnosis not present

## 2024-01-31 DIAGNOSIS — N189 Chronic kidney disease, unspecified: Secondary | ICD-10-CM | POA: Diagnosis not present

## 2024-02-11 ENCOUNTER — Other Ambulatory Visit: Payer: Self-pay | Admitting: Thoracic Surgery (Cardiothoracic Vascular Surgery)

## 2024-02-11 DIAGNOSIS — Z85118 Personal history of other malignant neoplasm of bronchus and lung: Secondary | ICD-10-CM

## 2024-02-11 DIAGNOSIS — Z902 Acquired absence of lung [part of]: Secondary | ICD-10-CM

## 2024-02-11 DIAGNOSIS — Z8719 Personal history of other diseases of the digestive system: Secondary | ICD-10-CM

## 2024-03-02 DIAGNOSIS — N189 Chronic kidney disease, unspecified: Secondary | ICD-10-CM | POA: Diagnosis not present

## 2024-03-02 DIAGNOSIS — I129 Hypertensive chronic kidney disease with stage 1 through stage 4 chronic kidney disease, or unspecified chronic kidney disease: Secondary | ICD-10-CM | POA: Diagnosis not present

## 2024-03-11 ENCOUNTER — Ambulatory Visit (HOSPITAL_COMMUNITY)
Admission: RE | Admit: 2024-03-11 | Discharge: 2024-03-11 | Disposition: A | Discharge status: Home / Self Care | Source: Ambulatory Visit | Attending: Thoracic Surgery (Cardiothoracic Vascular Surgery) | Admitting: Thoracic Surgery (Cardiothoracic Vascular Surgery)

## 2024-03-11 DIAGNOSIS — Z902 Acquired absence of lung [part of]: Secondary | ICD-10-CM | POA: Insufficient documentation

## 2024-03-11 DIAGNOSIS — I700 Atherosclerosis of aorta: Secondary | ICD-10-CM | POA: Diagnosis not present

## 2024-03-11 DIAGNOSIS — R911 Solitary pulmonary nodule: Secondary | ICD-10-CM | POA: Diagnosis not present

## 2024-03-11 DIAGNOSIS — K449 Diaphragmatic hernia without obstruction or gangrene: Secondary | ICD-10-CM | POA: Diagnosis not present

## 2024-03-11 DIAGNOSIS — Z8719 Personal history of other diseases of the digestive system: Secondary | ICD-10-CM | POA: Insufficient documentation

## 2024-03-11 DIAGNOSIS — Z85118 Personal history of other malignant neoplasm of bronchus and lung: Secondary | ICD-10-CM | POA: Insufficient documentation

## 2024-03-11 DIAGNOSIS — K7689 Other specified diseases of liver: Secondary | ICD-10-CM | POA: Diagnosis not present

## 2024-03-11 DIAGNOSIS — I2510 Atherosclerotic heart disease of native coronary artery without angina pectoris: Secondary | ICD-10-CM | POA: Diagnosis not present

## 2024-03-11 DIAGNOSIS — R918 Other nonspecific abnormal finding of lung field: Secondary | ICD-10-CM | POA: Diagnosis not present

## 2024-03-18 DIAGNOSIS — N1831 Chronic kidney disease, stage 3a: Secondary | ICD-10-CM | POA: Diagnosis not present

## 2024-03-18 DIAGNOSIS — D509 Iron deficiency anemia, unspecified: Secondary | ICD-10-CM | POA: Diagnosis not present

## 2024-03-18 DIAGNOSIS — Z23 Encounter for immunization: Secondary | ICD-10-CM | POA: Diagnosis not present

## 2024-03-18 DIAGNOSIS — M810 Age-related osteoporosis without current pathological fracture: Secondary | ICD-10-CM | POA: Diagnosis not present

## 2024-03-18 DIAGNOSIS — E559 Vitamin D deficiency, unspecified: Secondary | ICD-10-CM | POA: Diagnosis not present

## 2024-03-18 DIAGNOSIS — I739 Peripheral vascular disease, unspecified: Secondary | ICD-10-CM | POA: Diagnosis not present

## 2024-03-18 DIAGNOSIS — I700 Atherosclerosis of aorta: Secondary | ICD-10-CM | POA: Diagnosis not present

## 2024-03-18 DIAGNOSIS — I10 Essential (primary) hypertension: Secondary | ICD-10-CM | POA: Diagnosis not present

## 2024-03-18 DIAGNOSIS — J432 Centrilobular emphysema: Secondary | ICD-10-CM | POA: Diagnosis not present

## 2024-03-18 DIAGNOSIS — C3491 Malignant neoplasm of unspecified part of right bronchus or lung: Secondary | ICD-10-CM | POA: Diagnosis not present

## 2024-03-18 DIAGNOSIS — Z0000 Encounter for general adult medical examination without abnormal findings: Secondary | ICD-10-CM | POA: Diagnosis not present

## 2024-03-18 DIAGNOSIS — R7301 Impaired fasting glucose: Secondary | ICD-10-CM | POA: Diagnosis not present

## 2024-03-21 ENCOUNTER — Ambulatory Visit
Attending: Thoracic Surgery (Cardiothoracic Vascular Surgery) | Admitting: Thoracic Surgery (Cardiothoracic Vascular Surgery)

## 2024-03-21 DIAGNOSIS — Z8719 Personal history of other diseases of the digestive system: Secondary | ICD-10-CM

## 2024-03-21 DIAGNOSIS — Z902 Acquired absence of lung [part of]: Secondary | ICD-10-CM | POA: Diagnosis not present

## 2024-03-21 DIAGNOSIS — Z9889 Other specified postprocedural states: Secondary | ICD-10-CM | POA: Diagnosis not present

## 2024-03-21 NOTE — Progress Notes (Signed)
.  [facility]    [address]       [place] [address]             [id number]       Patient: Home Provider: Office Consent for Telemedicine visit obtained.  Today's visit was completed via a real-time telehealth (see specific modality noted below). The patient/authorized person provided oral consent at the time of the visit to engage in a telemedicine encounter with the present provider at [facility]. The patient/authorized person was informed of the potential benefits, limitations, and risks of telemedicine. The patient/authorized person expressed understanding that the laws that protect confidentiality also apply to telemedicine. The patient/authorized person acknowledged understanding that telemedicine does not provide emergency services and that he or she would need to call 911 or proceed to the nearest hospital for help if such a need arose.   Total time spent in the clinical discussion 10 minutes.  Telehealth Modality: Phone visit (audio only)  I had a telephone visit with Mrs. Mannie.  She is doing well overall.  Her reflux symptoms are well controlled with medication.  She has 3 more refills through [facility].  She denies any other issues.    She will follow-up in 1 yr with another CT chest.  He original RMLectomy for a T1aN0M0 adeno was in 2021.    IMPRESSION: 1. Stable 7 mm right lower lobe nodule and 4 mm left lower lobe nodule. The 7 mm nodule was first seen on 09/12/2022. Six-month follow-up study is recommended to document 2 years of stability. No PET activity was detected. 2. Aortic and coronary artery atherosclerosis. 3. Small hiatal hernia. 4. Hepatic cysts.

## 2024-04-01 DIAGNOSIS — I700 Atherosclerosis of aorta: Secondary | ICD-10-CM | POA: Diagnosis not present

## 2024-04-01 DIAGNOSIS — R7301 Impaired fasting glucose: Secondary | ICD-10-CM | POA: Diagnosis not present

## 2024-04-01 DIAGNOSIS — N189 Chronic kidney disease, unspecified: Secondary | ICD-10-CM | POA: Diagnosis not present

## 2024-04-01 DIAGNOSIS — Z6823 Body mass index (BMI) 23.0-23.9, adult: Secondary | ICD-10-CM | POA: Diagnosis not present

## 2024-04-01 DIAGNOSIS — I129 Hypertensive chronic kidney disease with stage 1 through stage 4 chronic kidney disease, or unspecified chronic kidney disease: Secondary | ICD-10-CM | POA: Diagnosis not present

## 2024-04-01 DIAGNOSIS — I10 Essential (primary) hypertension: Secondary | ICD-10-CM | POA: Diagnosis not present

## 2024-04-01 DIAGNOSIS — I739 Peripheral vascular disease, unspecified: Secondary | ICD-10-CM | POA: Diagnosis not present

## 2024-04-05 ENCOUNTER — Other Ambulatory Visit: Payer: Self-pay | Admitting: Thoracic Surgery (Cardiothoracic Vascular Surgery)

## 2024-06-09 ENCOUNTER — Other Ambulatory Visit: Payer: Self-pay | Admitting: Cardiovascular Disease

## 2024-07-13 ENCOUNTER — Other Ambulatory Visit: Payer: Self-pay | Admitting: Thoracic Surgery (Cardiothoracic Vascular Surgery)

## 2024-07-17 ENCOUNTER — Other Ambulatory Visit: Payer: Self-pay

## 2024-07-17 DIAGNOSIS — K219 Gastro-esophageal reflux disease without esophagitis: Secondary | ICD-10-CM

## 2024-07-17 MED ORDER — PANTOPRAZOLE SODIUM 40 MG PO TBEC: 40.0000 mg | DELAYED_RELEASE_TABLET | Freq: Every day | ORAL | 3 refills | Status: AC
